# Patient Record
Sex: Female | Born: 1945 | Race: White | Hispanic: No | Marital: Married | Smoking: Never smoker
Health system: Southern US, Community
[De-identification: ages and names within clinical notes are randomized; demographics above are authoritative.]

## PROBLEM LIST (undated history)

## (undated) DIAGNOSIS — H269 Unspecified cataract: Secondary | ICD-10-CM

## (undated) DIAGNOSIS — T8859XA Other complications of anesthesia, initial encounter: Secondary | ICD-10-CM

## (undated) DIAGNOSIS — T4145XA Adverse effect of unspecified anesthetic, initial encounter: Secondary | ICD-10-CM

## (undated) DIAGNOSIS — N200 Calculus of kidney: Secondary | ICD-10-CM

## (undated) DIAGNOSIS — E079 Disorder of thyroid, unspecified: Secondary | ICD-10-CM

## (undated) DIAGNOSIS — D126 Benign neoplasm of colon, unspecified: Secondary | ICD-10-CM

## (undated) DIAGNOSIS — T753XXA Motion sickness, initial encounter: Secondary | ICD-10-CM

## (undated) DIAGNOSIS — R011 Cardiac murmur, unspecified: Secondary | ICD-10-CM

## (undated) DIAGNOSIS — I10 Essential (primary) hypertension: Secondary | ICD-10-CM

## (undated) DIAGNOSIS — R0902 Hypoxemia: Principal | ICD-10-CM

## (undated) DIAGNOSIS — I2510 Atherosclerotic heart disease of native coronary artery without angina pectoris: Secondary | ICD-10-CM

## (undated) DIAGNOSIS — N390 Urinary tract infection, site not specified: Secondary | ICD-10-CM

## (undated) DIAGNOSIS — K5900 Constipation, unspecified: Secondary | ICD-10-CM

## (undated) DIAGNOSIS — F329 Major depressive disorder, single episode, unspecified: Secondary | ICD-10-CM

## (undated) DIAGNOSIS — M549 Dorsalgia, unspecified: Secondary | ICD-10-CM

## (undated) DIAGNOSIS — K5792 Diverticulitis of intestine, part unspecified, without perforation or abscess without bleeding: Secondary | ICD-10-CM

## (undated) DIAGNOSIS — D72829 Elevated white blood cell count, unspecified: Secondary | ICD-10-CM

## (undated) DIAGNOSIS — E039 Hypothyroidism, unspecified: Secondary | ICD-10-CM

## (undated) DIAGNOSIS — E785 Hyperlipidemia, unspecified: Secondary | ICD-10-CM

## (undated) DIAGNOSIS — Z9981 Dependence on supplemental oxygen: Secondary | ICD-10-CM

## (undated) DIAGNOSIS — G4730 Sleep apnea, unspecified: Secondary | ICD-10-CM

## (undated) DIAGNOSIS — F32A Depression, unspecified: Secondary | ICD-10-CM

## (undated) DIAGNOSIS — M1990 Unspecified osteoarthritis, unspecified site: Secondary | ICD-10-CM

## (undated) DIAGNOSIS — L989 Disorder of the skin and subcutaneous tissue, unspecified: Secondary | ICD-10-CM

## (undated) DIAGNOSIS — B09 Unspecified viral infection characterized by skin and mucous membrane lesions: Principal | ICD-10-CM

## (undated) DIAGNOSIS — J40 Bronchitis, not specified as acute or chronic: Principal | ICD-10-CM

## (undated) DIAGNOSIS — R232 Flushing: Secondary | ICD-10-CM

## (undated) DIAGNOSIS — I313 Pericardial effusion (noninflammatory): Secondary | ICD-10-CM

## (undated) DIAGNOSIS — I219 Acute myocardial infarction, unspecified: Secondary | ICD-10-CM

## (undated) DIAGNOSIS — M797 Fibromyalgia: Secondary | ICD-10-CM

## (undated) DIAGNOSIS — K219 Gastro-esophageal reflux disease without esophagitis: Secondary | ICD-10-CM

## (undated) HISTORY — DX: Benign neoplasm of colon, unspecified: D12.6

## (undated) HISTORY — DX: Urinary tract infection, site not specified: N39.0

## (undated) HISTORY — DX: Disorder of thyroid, unspecified: E07.9

## (undated) HISTORY — DX: Constipation, unspecified: K59.00

## (undated) HISTORY — DX: Unspecified cataract: H26.9

## (undated) HISTORY — DX: Essential (primary) hypertension: I10

## (undated) HISTORY — DX: Pericardial effusion (noninflammatory): I31.3

## (undated) HISTORY — DX: Unspecified viral infection characterized by skin and mucous membrane lesions: B09

## (undated) HISTORY — DX: Elevated white blood cell count, unspecified: D72.829

## (undated) HISTORY — DX: Disorder of the skin and subcutaneous tissue, unspecified: L98.9

## (undated) HISTORY — DX: Depression, unspecified: F32.A

## (undated) HISTORY — DX: Hypoxemia: R09.02

## (undated) HISTORY — DX: Major depressive disorder, single episode, unspecified: F32.9

## (undated) HISTORY — DX: Hyperlipidemia, unspecified: E78.5

## (undated) HISTORY — DX: Calculus of kidney: N20.0

## (undated) HISTORY — DX: Flushing: R23.2

## (undated) HISTORY — DX: Bronchitis, not specified as acute or chronic: J40

## (undated) HISTORY — DX: Motion sickness, initial encounter: T75.3XXA

## (undated) HISTORY — DX: Diverticulitis of intestine, part unspecified, without perforation or abscess without bleeding: K57.92

## (undated) HISTORY — DX: Dorsalgia, unspecified: M54.9

## (undated) HISTORY — DX: Unspecified osteoarthritis, unspecified site: M19.90

---
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
## 1969-01-03 HISTORY — PX: CHOLECYSTECTOMY: SHX55

## 1975-05-06 HISTORY — PX: ABDOMINAL HYSTERECTOMY: SHX81

## 1976-05-05 HISTORY — PX: OTHER SURGICAL HISTORY: SHX169

## 1978-05-05 HISTORY — PX: ANGIOPLASTY: SHX39

## 1998-04-09 ENCOUNTER — Encounter: Payer: Self-pay | Admitting: Emergency Medicine

## 1998-04-09 ENCOUNTER — Inpatient Hospital Stay (HOSPITAL_COMMUNITY): Admission: EM | Admit: 1998-04-09 | Discharge: 1998-04-10 | Payer: Self-pay | Admitting: Emergency Medicine

## 1998-04-09 ENCOUNTER — Encounter: Payer: Self-pay | Admitting: Cardiovascular Disease

## 1998-12-04 ENCOUNTER — Other Ambulatory Visit: Admission: RE | Admit: 1998-12-04 | Discharge: 1998-12-04 | Payer: Self-pay | Admitting: Gynecology

## 2000-01-07 ENCOUNTER — Other Ambulatory Visit: Admission: RE | Admit: 2000-01-07 | Discharge: 2000-01-07 | Payer: Self-pay | Admitting: Gynecology

## 2000-03-05 ENCOUNTER — Encounter: Admission: RE | Admit: 2000-03-05 | Discharge: 2000-03-05 | Payer: Self-pay | Admitting: General Surgery

## 2000-03-05 ENCOUNTER — Encounter: Payer: Self-pay | Admitting: General Surgery

## 2000-11-30 ENCOUNTER — Ambulatory Visit (HOSPITAL_COMMUNITY): Admission: RE | Admit: 2000-11-30 | Discharge: 2000-11-30 | Payer: Self-pay | Admitting: Gastroenterology

## 2000-11-30 ENCOUNTER — Encounter (INDEPENDENT_AMBULATORY_CARE_PROVIDER_SITE_OTHER): Payer: Self-pay | Admitting: *Deleted

## 2000-12-10 ENCOUNTER — Encounter: Admission: RE | Admit: 2000-12-10 | Discharge: 2001-03-10 | Payer: Self-pay | Admitting: Family Medicine

## 2001-03-17 ENCOUNTER — Other Ambulatory Visit: Admission: RE | Admit: 2001-03-17 | Discharge: 2001-03-17 | Payer: Self-pay | Admitting: Gynecology

## 2001-04-26 ENCOUNTER — Encounter: Admission: RE | Admit: 2001-04-26 | Discharge: 2001-04-26 | Payer: Self-pay | Admitting: Gynecology

## 2001-04-26 ENCOUNTER — Encounter: Payer: Self-pay | Admitting: Gynecology

## 2001-07-22 ENCOUNTER — Encounter: Payer: Self-pay | Admitting: Family Medicine

## 2001-07-22 ENCOUNTER — Encounter: Admission: RE | Admit: 2001-07-22 | Discharge: 2001-07-22 | Payer: Self-pay | Admitting: Family Medicine

## 2002-05-02 ENCOUNTER — Encounter: Payer: Self-pay | Admitting: Gynecology

## 2002-05-02 ENCOUNTER — Encounter: Admission: RE | Admit: 2002-05-02 | Discharge: 2002-05-02 | Payer: Self-pay | Admitting: Gynecology

## 2002-05-02 ENCOUNTER — Other Ambulatory Visit: Admission: RE | Admit: 2002-05-02 | Discharge: 2002-05-02 | Payer: Self-pay | Admitting: Gynecology

## 2002-05-05 HISTORY — PX: BACK SURGERY: SHX140

## 2002-05-05 HISTORY — PX: OTHER SURGICAL HISTORY: SHX169

## 2002-08-01 ENCOUNTER — Encounter
Admission: RE | Admit: 2002-08-01 | Discharge: 2002-08-01 | Payer: Self-pay | Admitting: Physical Medicine and Rehabilitation

## 2002-08-01 ENCOUNTER — Encounter: Payer: Self-pay | Admitting: Physical Medicine and Rehabilitation

## 2002-10-13 ENCOUNTER — Inpatient Hospital Stay (HOSPITAL_COMMUNITY): Admission: RE | Admit: 2002-10-13 | Discharge: 2002-10-14 | Payer: Self-pay | Admitting: Specialist

## 2003-08-09 ENCOUNTER — Other Ambulatory Visit: Admission: RE | Admit: 2003-08-09 | Discharge: 2003-08-09 | Payer: Self-pay | Admitting: Gynecology

## 2003-08-09 ENCOUNTER — Encounter: Admission: RE | Admit: 2003-08-09 | Discharge: 2003-08-09 | Payer: Self-pay | Admitting: Gynecology

## 2003-12-20 ENCOUNTER — Observation Stay (HOSPITAL_COMMUNITY): Admission: RE | Admit: 2003-12-20 | Discharge: 2003-12-21 | Payer: Self-pay | Admitting: Specialist

## 2004-05-05 HISTORY — PX: ANGIOPLASTY: SHX39

## 2004-08-27 ENCOUNTER — Encounter: Admission: RE | Admit: 2004-08-27 | Discharge: 2004-08-27 | Payer: Self-pay | Admitting: Gynecology

## 2004-08-27 ENCOUNTER — Other Ambulatory Visit: Admission: RE | Admit: 2004-08-27 | Discharge: 2004-08-27 | Payer: Self-pay | Admitting: Gynecology

## 2004-10-15 ENCOUNTER — Encounter: Admission: RE | Admit: 2004-10-15 | Discharge: 2004-10-15 | Payer: Self-pay | Admitting: Specialist

## 2005-01-03 ENCOUNTER — Encounter: Admission: RE | Admit: 2005-01-03 | Discharge: 2005-01-03 | Payer: Self-pay | Admitting: Specialist

## 2005-01-08 ENCOUNTER — Encounter: Admission: RE | Admit: 2005-01-08 | Discharge: 2005-01-08 | Payer: Self-pay | Admitting: Specialist

## 2005-04-07 ENCOUNTER — Inpatient Hospital Stay (HOSPITAL_COMMUNITY): Admission: AD | Admit: 2005-04-07 | Discharge: 2005-04-11 | Payer: Self-pay | Admitting: Cardiovascular Disease

## 2005-04-24 ENCOUNTER — Inpatient Hospital Stay (HOSPITAL_COMMUNITY): Admission: EM | Admit: 2005-04-24 | Discharge: 2005-04-25 | Payer: Self-pay | Admitting: Emergency Medicine

## 2005-08-28 ENCOUNTER — Encounter: Admission: RE | Admit: 2005-08-28 | Discharge: 2005-08-28 | Payer: Self-pay | Admitting: Gynecology

## 2005-08-28 ENCOUNTER — Other Ambulatory Visit: Admission: RE | Admit: 2005-08-28 | Discharge: 2005-08-28 | Payer: Self-pay | Admitting: Gynecology

## 2006-05-05 HISTORY — PX: HIP SURGERY: SHX245

## 2006-06-01 DIAGNOSIS — G4730 Sleep apnea, unspecified: Secondary | ICD-10-CM

## 2006-06-01 HISTORY — DX: Sleep apnea, unspecified: G47.30

## 2006-10-12 ENCOUNTER — Other Ambulatory Visit: Admission: RE | Admit: 2006-10-12 | Discharge: 2006-10-12 | Payer: Self-pay | Admitting: Gynecology

## 2006-10-12 ENCOUNTER — Encounter: Admission: RE | Admit: 2006-10-12 | Discharge: 2006-10-12 | Payer: Self-pay | Admitting: Gynecology

## 2006-10-17 ENCOUNTER — Emergency Department (HOSPITAL_COMMUNITY): Admission: EM | Admit: 2006-10-17 | Discharge: 2006-10-17 | Payer: Self-pay | Admitting: Emergency Medicine

## 2007-04-23 ENCOUNTER — Observation Stay (HOSPITAL_COMMUNITY): Admission: EM | Admit: 2007-04-23 | Discharge: 2007-04-24 | Payer: Self-pay | Admitting: Emergency Medicine

## 2007-05-06 HISTORY — PX: ANGIOPLASTY: SHX39

## 2007-08-06 ENCOUNTER — Inpatient Hospital Stay (HOSPITAL_COMMUNITY): Admission: RE | Admit: 2007-08-06 | Discharge: 2007-08-07 | Payer: Self-pay | Admitting: Cardiovascular Disease

## 2007-08-19 ENCOUNTER — Encounter (HOSPITAL_COMMUNITY): Admission: RE | Admit: 2007-08-19 | Discharge: 2007-11-17 | Payer: Self-pay | Admitting: Cardiovascular Disease

## 2007-11-18 ENCOUNTER — Encounter (HOSPITAL_COMMUNITY): Admission: RE | Admit: 2007-11-18 | Discharge: 2008-01-27 | Payer: Self-pay | Admitting: Cardiovascular Disease

## 2008-01-20 ENCOUNTER — Encounter: Admission: RE | Admit: 2008-01-20 | Discharge: 2008-01-20 | Payer: Self-pay | Admitting: Gynecology

## 2008-01-20 ENCOUNTER — Other Ambulatory Visit: Admission: RE | Admit: 2008-01-20 | Discharge: 2008-01-20 | Payer: Self-pay | Admitting: Gynecology

## 2008-02-15 ENCOUNTER — Encounter: Admission: RE | Admit: 2008-02-15 | Discharge: 2008-02-15 | Payer: Self-pay | Admitting: Family Medicine

## 2008-05-05 HISTORY — PX: OTHER SURGICAL HISTORY: SHX169

## 2008-07-31 ENCOUNTER — Inpatient Hospital Stay (HOSPITAL_COMMUNITY): Admission: EM | Admit: 2008-07-31 | Discharge: 2008-08-02 | Payer: Self-pay | Admitting: Emergency Medicine

## 2008-08-01 ENCOUNTER — Encounter (INDEPENDENT_AMBULATORY_CARE_PROVIDER_SITE_OTHER): Payer: Self-pay | Admitting: Internal Medicine

## 2009-01-03 ENCOUNTER — Inpatient Hospital Stay (HOSPITAL_COMMUNITY): Admission: AD | Admit: 2009-01-03 | Discharge: 2009-01-04 | Payer: Self-pay | Admitting: Orthopedic Surgery

## 2009-05-05 LAB — HM COLONOSCOPY

## 2009-05-14 ENCOUNTER — Encounter: Admission: RE | Admit: 2009-05-14 | Discharge: 2009-05-14 | Payer: Self-pay | Admitting: Family Medicine

## 2009-05-22 ENCOUNTER — Encounter: Admission: RE | Admit: 2009-05-22 | Discharge: 2009-05-22 | Payer: Self-pay | Admitting: Family Medicine

## 2009-09-28 ENCOUNTER — Observation Stay (HOSPITAL_COMMUNITY): Admission: RE | Admit: 2009-09-28 | Discharge: 2009-09-29 | Payer: Self-pay | Admitting: Orthopedic Surgery

## 2010-05-25 ENCOUNTER — Other Ambulatory Visit: Payer: Self-pay | Admitting: Obstetrics and Gynecology

## 2010-05-25 DIAGNOSIS — Z1239 Encounter for other screening for malignant neoplasm of breast: Secondary | ICD-10-CM

## 2010-05-26 ENCOUNTER — Encounter: Payer: Self-pay | Admitting: Specialist

## 2010-05-26 ENCOUNTER — Encounter: Payer: Self-pay | Admitting: Family Medicine

## 2010-05-27 ENCOUNTER — Encounter: Payer: Self-pay | Admitting: Gynecology

## 2010-06-24 ENCOUNTER — Ambulatory Visit
Admission: RE | Admit: 2010-06-24 | Discharge: 2010-06-24 | Disposition: A | Discharge status: Home / Self Care | Payer: Medicare Other | Source: Ambulatory Visit | Attending: Obstetrics and Gynecology | Admitting: Obstetrics and Gynecology

## 2010-06-24 DIAGNOSIS — Z1239 Encounter for other screening for malignant neoplasm of breast: Secondary | ICD-10-CM

## 2010-07-08 ENCOUNTER — Encounter: Payer: Self-pay | Admitting: Family Medicine

## 2010-07-08 LAB — HM MAMMOGRAPHY

## 2010-07-16 ENCOUNTER — Encounter: Payer: Self-pay | Admitting: Family Medicine

## 2010-07-16 ENCOUNTER — Ambulatory Visit (INDEPENDENT_AMBULATORY_CARE_PROVIDER_SITE_OTHER): Payer: Medicare Other | Admitting: Family Medicine

## 2010-07-16 DIAGNOSIS — E119 Type 2 diabetes mellitus without complications: Secondary | ICD-10-CM | POA: Insufficient documentation

## 2010-07-16 DIAGNOSIS — I2510 Atherosclerotic heart disease of native coronary artery without angina pectoris: Secondary | ICD-10-CM

## 2010-07-16 DIAGNOSIS — E785 Hyperlipidemia, unspecified: Secondary | ICD-10-CM

## 2010-07-16 DIAGNOSIS — I10 Essential (primary) hypertension: Secondary | ICD-10-CM

## 2010-07-16 DIAGNOSIS — E039 Hypothyroidism, unspecified: Secondary | ICD-10-CM | POA: Insufficient documentation

## 2010-07-16 DIAGNOSIS — D649 Anemia, unspecified: Secondary | ICD-10-CM | POA: Insufficient documentation

## 2010-07-16 DIAGNOSIS — G4730 Sleep apnea, unspecified: Secondary | ICD-10-CM

## 2010-07-16 DIAGNOSIS — IMO0001 Reserved for inherently not codable concepts without codable children: Secondary | ICD-10-CM | POA: Insufficient documentation

## 2010-07-16 DIAGNOSIS — F341 Dysthymic disorder: Secondary | ICD-10-CM | POA: Insufficient documentation

## 2010-07-16 DIAGNOSIS — I059 Rheumatic mitral valve disease, unspecified: Secondary | ICD-10-CM | POA: Insufficient documentation

## 2010-07-16 DIAGNOSIS — K5730 Diverticulosis of large intestine without perforation or abscess without bleeding: Secondary | ICD-10-CM | POA: Insufficient documentation

## 2010-07-16 DIAGNOSIS — D126 Benign neoplasm of colon, unspecified: Secondary | ICD-10-CM | POA: Insufficient documentation

## 2010-07-16 DIAGNOSIS — G609 Hereditary and idiopathic neuropathy, unspecified: Secondary | ICD-10-CM | POA: Insufficient documentation

## 2010-07-16 DIAGNOSIS — Z9981 Dependence on supplemental oxygen: Secondary | ICD-10-CM | POA: Insufficient documentation

## 2010-07-17 ENCOUNTER — Encounter: Payer: Self-pay | Admitting: Family Medicine

## 2010-07-22 LAB — COMPREHENSIVE METABOLIC PANEL
ALT: 15 U/L (ref 0–35)
AST: 19 U/L (ref 0–37)
CO2: 32 mEq/L (ref 19–32)
Calcium: 9.5 mg/dL (ref 8.4–10.5)
Creatinine, Ser: 0.93 mg/dL (ref 0.4–1.2)
GFR calc Af Amer: >60 mL/min (ref >60)
GFR calc non Af Amer: >60 mL/min (ref >60)
Glucose, Bld: 141 mg/dL — ABNORMAL HIGH (ref 70–99)
Sodium: 141 mEq/L (ref 135–145)
Total Protein: 6.5 g/dL (ref 6.0–8.3)

## 2010-07-22 LAB — URINALYSIS, ROUTINE W REFLEX MICROSCOPIC
Bilirubin Urine: NEGATIVE
Hgb urine dipstick: NEGATIVE
Nitrite: NEGATIVE
Urobilinogen, UA: 1 mg/dL (ref 0.0–1.0)

## 2010-07-22 LAB — CBC
MCHC: 32.9 g/dL (ref 30.0–36.0)
MCV: 91.5 fL (ref 78.0–100.0)
RDW: 15.7 % — ABNORMAL HIGH (ref 11.5–15.5)

## 2010-07-22 LAB — GLUCOSE, CAPILLARY
Glucose-Capillary: 112 mg/dL — ABNORMAL HIGH (ref 70–99)
Glucose-Capillary: 164 mg/dL — ABNORMAL HIGH (ref 70–99)

## 2010-07-22 LAB — PROTIME-INR: Prothrombin Time: 12.6 seconds (ref 11.6–15.2)

## 2010-07-23 NOTE — Assessment & Plan Note (Signed)
Summary: New pt est care/dt   Vital Signs:  Patient profile:   65 year old female Menstrual status:  hysterectomy Height:      61 inches (154.94 cm) Weight:      247.25 pounds (112.39 kg) BMI:     46.89 O2 Sat:      90 % on Room air Temp:     98.1 degrees F (36.72 degrees C) oral Pulse rate:   77 / minute BP sitting:   116 / 72  (right arm) Cuff size:   large  Vitals Entered By: Josph Macho RMA (July 16, 2010 9:33 AM)  O2 Flow:  Room air CC: Establish new patient/ CF Is Patient Diabetic? Yes     Menstrual Status hysterectomy Last PAP Result historical   History of Present Illness:  patient is a 65 year old Caucasian female in today with her husband to establish medical care. she has a very complicated medical history this doing relatively well today. They have started her on multiple herbal medications are treating her peripheral neuropathy with gabapentin and she in her husband both agree that her chronic pain and debility are greatly improved. they agree with the initiation of herbal medicines her  mental clarity has improved and she is more active out in the world walking eating better and her mood is improved. so long history of shortness of breath for unclear reasons and actually history of hypoxia uses oxygen at night and only during the day when she is acutely short of breath. She sees Dr. Shon Baton still up for her OB/GYN care at their following some ovarian cysts she has an ultrasound scheduled for later this week. July history of coronary artery disease multiple angioplasties and stents currently follows with a cardiology Dr. Elyn Peers [facility] for. She sees Dr. Emily Filbert for her eye care.  she denies any recent illness, fevers, chills, chest pain, palpitations, GI or GU concerns appearing in any report  Preventive Screening-Counseling & Management  Alcohol-Tobacco     Smoking Status: never  Caffeine-Diet-Exercise     Does Patient Exercise: yes      Drug Use:  no.      Current Medications (verified): 1)  Vytorin 10-80 Mg Tabs (Ezetimibe-Simvastatin) .... Once Daily 2)  Plavix 75 Mg Tabs (Clopidogrel Bisulfate) .... Qd 3)  Amlodipine Besy-Benazepril Hcl 5-20 Mg Caps (Amlodipine Besy-Benazepril Hcl) .... Once Daily 4)  Metformin Hcl 1000 Mg Tabs (Metformin Hcl) .... Two Times A Day 5)  Aspirin 81 Mg Tbec (Aspirin) .... Once Daily 6)  Sucralfate 1 Gm Tabs (Sucralfate) .... When Needed 7)  Pantoprazole Sodium 40 Mg Tbec (Pantoprazole Sodium) .... Once Daily 8)  Celexa 40 Mg Tabs (Citalopram Hydrobromide) .... Once Daily 9)  Carvedilol 12.5 Mg Tabs (Carvedilol) .... Two Times A Day 10)  Levothroid 25 Mcg Tabs (Levothyroxine Sodium) .... Once Daily 11)  Vitamin D-3 1000 Mg .... Once Daily 12)  Gabapentin 300 Mg Caps (Gabapentin) .... 4 At Bedtime 13)  Cymbalta 60 Mg Cpep (Duloxetine Hcl) .... Two Times A Day 14)  Isosorbide Mononitrate Cr 60 Mg Xr24h-Tab (Isosorbide Mononitrate) .... Once Daily 15)  Citalopram Hydrobromide 40 Mg Tabs (Citalopram Hydrobromide) .... Once Daily 16)  Vitamin B-12 1000 Mcg Tabs (Cyanocobalamin) .... Once Daily 17)  Magnesium Oxide 400 Mg Tabs (Magnesium Oxide) .... Once Daily 18)  Gaba 500 Mg .... Two Times A Day 19)  Kava Kava 150 Mg .... Two Times A Day 20)  Brewers Yeast  Tabs (Brewers Yeast) .... Once Daily 21)  Magnesium 400 Mg .... Once Daily 22)  Vitamin D 2000 Unit Tabs (Cholecalciferol) .... Daily 23)  Probiotics .Marland KitchenMarland Kitchen. 1 Capsule 24)  Rhodeola Rosea 500 Mg .... Once Daily  Allergies (verified): No Known Drug Allergies  Past History:  Family History: Last updated: 08-14-2010 Father: deceased@63 , heart disease Mother: unknown history, abandoned familly Siblings:  Brother: deceased@58 , emphysema, smoker Brother: deceased@68 , emphysema, smoker, diabetes, heart disease Brother, twin, 60, thyroid cancer, CAD s/p MIs and 7 stents, DM, esophageal and tongue polyps, colonic polyps, back disease with bulging  discs Sister: 81, diabetes, HTN, colon cancer s/p colectomy, depression Sister: 1, diabetes, brain aneurysm s/p coiling, short term memory loss MGM: deceased of brain aneurysm at 84 MGF: unknown history PGM: deceased@80 , DM, HTN, colon cancer PGF: deceased at 26, run over by car Children: Son: 31, Crohn's Disease Daughter: 39, DM, HTN,Trichotrilomania, Depression, diverticulitis Son: 43, DM, anxious, smoker  Social History: Last updated: Aug 14, 2010 Retired Conservation officer, nature Married Never Smoked Drug use-no Regular exercise-yes, walking has begun Alcohol use-no Wears seat belt  No dietary restrictions, just diabetic precaustions  Risk Factors: Exercise: yes (08-14-10)  Risk Factors: Smoking Status: never (08-14-10)  Past medical history reviewed for relevance to current acute and chronic problems. Social history (including risk factors) reviewed for relevance to current acute and chronic problems.  Past Surgical History: Angioplasty 1980 Angioplasty 3 stents, 2006 Angioplasty 2 stents, 2009 Cholecystectomy Thyroidectomy for goiter, 1950 at age 19-6 yrs old Hysterectomy, partial, 1977 Diverticulitis, exploratory, 1978 right rotator cuff repair, 2004 left rotator cuff repair, 2004 Bone fusion left foot, 2010, 7 screws in foot, bunionectomy Hip surgery, large bone spur removed, right Back surgery, 2004, lumbar discectomy for bulging disc  Family History: Father: deceased@63 , heart disease Mother: unknown history, abandoned familly Siblings:  Brother: deceased@58 , emphysema, smoker Brother: deceased@68 , emphysema, smoker, diabetes, heart disease Brother, twin, 15, thyroid cancer, CAD s/p MIs and 7 stents, DM, esophageal and tongue polyps, colonic polyps, back disease with bulging discs Sister: 67, diabetes, HTN, colon cancer s/p colectomy, depression Sister: 72, diabetes, brain aneurysm s/p coiling, short term memory loss MGM: deceased of brain aneurysm at 50 MGF:  unknown history PGM: deceased@80 , DM, HTN, colon cancer PGF: deceased at 75, run over by car Children: Son: 64, Crohn's Disease Daughter: 40, DM, HTN,Trichotrilomania, Depression, diverticulitis Son: 41, DM, anxious, smoker  Social History: Reviewed history and no changes required. Retired Conservation officer, nature Married Never Smoked Drug use-no Regular exercise-yes, walking has begun Alcohol use-no Wears seat belt  No dietary restrictions, just diabetic precaustions Smoking Status:  never Drug Use:  no Does Patient Exercise:  yes  Review of Systems       The patient complains of depression.  The patient denies anorexia, fever, weight loss, weight gain, vision loss, decreased hearing, hoarseness, chest pain, syncope, dyspnea on exertion, peripheral edema, prolonged cough, headaches, hemoptysis, abdominal pain, melena, hematochezia, severe indigestion/heartburn, hematuria, incontinence, genital sores, muscle weakness, suspicious skin lesions, transient blindness, difficulty walking, unusual weight change, abnormal bleeding, and enlarged lymph nodes.    Physical Exam  General:  Well-developed,well-nourished,in no acute distress; alert,appropriate and cooperative throughout examination, obese Head:  Normocephalic and atraumatic without obvious abnormalities. No apparent alopecia or balding. Eyes:  No corneal or conjunctival inflammation noted. EOMI. Perrla. Funduscopic exam benign, without hemorrhages, exudates or papilledema. Vision grossly normal. Ears:  External ear exam shows no significant lesions or deformities.  Otoscopic examination reveals clear canals, tympanic membranes are intact bilaterally without bulging, retraction, inflammation or discharge. Hearing is grossly normal bilaterally.  Nose:  External nasal examination shows no deformity or inflammation. Nasal mucosa are pink and moist without lesions or exudates. Mouth:  Oral mucosa and oropharynx without lesions or exudates.  Teeth  in good repair. Neck:  No deformities, masses, or tenderness noted. Lungs:  Normal respiratory effort, chest expands symmetrically. Lungs are clear to auscultation, no crackles or wheezes. Heart:  Normal rate and regular rhythm. S1 and S2 normal without gallop, click, rub or other extra sounds.Grade  1 /6 systolic ejection murmur.   Abdomen:  Bowel sounds positive,abdomen soft and non-tender without masses, organomegaly or hernias noted. Msk:  No deformity or scoliosis noted of thoracic or lumbar spine.   Pulses:  R and L carotid,radial,femoral,dorsalis pedis and posterior tibial pulses are full and equal bilaterally Extremities:  No clubbing, cyanosis, edema, or deformity noted with normal full range of motion of all joints.   Neurologic:  No cranial nerve deficits noted. Station and gait are normal. Plantar reflexes are down-going bilaterally. DTRs are symmetrical throughout. Sensory, motor and coordinative functions appear intact. Skin:  Intact without suspicious lesions or rashes Cervical Nodes:  No lymphadenopathy noted Psych:  Cognition and judgment appear intact. Alert and cooperative with normal attention span and concentration. No apparent delusions, illusions, hallucinations   Impression & Recommendations:  Problem # 1:  ESSENTIAL HYPERTENSION, BENIGN (ICD-401.1)  Her updated medication list for this problem includes:    Amlodipine Besy-benazepril Hcl 5-20 Mg Caps (Amlodipine besy-benazepril hcl) ..... Once daily    Carvedilol 12.5 Mg Tabs (Carvedilol) .Marland Kitchen..Marland Kitchen Two times a day  Orders: T-Basic Metabolic Panel [id number]) T-Hepatic Function [phone]) Well controlled, no change in therapy  Problem # 2:  DM (ICD-250.00)  Her updated medication list for this problem includes:    Amlodipine Besy-benazepril Hcl 5-20 Mg Caps (Amlodipine besy-benazepril hcl) ..... Once daily    Metformin Hcl 1000 Mg Tabs (Metformin hcl) .Marland Kitchen..Marland Kitchen Two times a day    Aspirin 81 Mg Tbec (Aspirin) .....  Once daily  Orders: T- Hemoglobin A1C ([id number]) T-Urine Microalbumin w/creat. ratio [id number]) Monitor sugars, call if any concerns, avoid simple carbs  Problem # 3:  SLEEP APNEA (ICD-780.57) Use CPAP as instructed and will continue at bedtime O2 and O2 use during the day as needed  Problem # 4:  CAD (ICD-414.00)  Her updated medication list for this problem includes:    Plavix 75 Mg Tabs (Clopidogrel bisulfate) ..... Qd    Amlodipine Besy-benazepril Hcl 5-20 Mg Caps (Amlodipine besy-benazepril hcl) ..... Once daily    Aspirin 81 Mg Tbec (Aspirin) ..... Once daily    Carvedilol 12.5 Mg Tabs (Carvedilol) .Marland Kitchen..Marland Kitchen Two times a day     Isosorbide Mononitrate Cr 60 Mg Xr24h-tab (Isosorbide mononitrate) ..... Once daily Follows with Cardiology Dr Elyn Peers, will continue this care, is scheduled for repeat stress test in 7/12  Problem # 5:  ANEMIA (ICD-285.9)  Her updated medication list for this problem includes:    Vitamin B-12 1000 Mcg Tabs (Cyanocobalamin) ..... Once daily  Orders: T-CBC No Diff ([id number]) Maintain good intake of dark, leafy greens  Problem # 6:  UNSPECIFIED MYALGIA AND MYOSITIS (ICD-729.1)  Her updated medication list for this problem includes:    Aspirin 81 Mg Tbec (Aspirin) ..... Once daily Improved with increased, improved diet, weight loss and herbal supplements, patient will continue the same  Problem # 7:  UNSPECIFIED HYPOTHYROIDISM (ICD-244.9)  Her updated medication list for this problem includes:    Levothroid 25 Mcg Tabs (Levothyroxine sodium) ..... Once daily Patient  is on stable dose, check TSH  Complete Medication List: 1)  Vytorin 10-80 Mg Tabs (Ezetimibe-simvastatin) .... Once daily 2)  Plavix 75 Mg Tabs (Clopidogrel bisulfate) .... Qd 3)  Amlodipine Besy-benazepril Hcl 5-20 Mg Caps (Amlodipine besy-benazepril hcl) .... Once daily 4)  Metformin Hcl 1000 Mg Tabs (Metformin hcl) .... Two times a day 5)  Aspirin 81 Mg Tbec  (Aspirin) .... Once daily 6)  Sucralfate 1 Gm Tabs (Sucralfate) .... When needed 7)  Pantoprazole Sodium 40 Mg Tbec (Pantoprazole sodium) .... Once daily 8)  Celexa 40 Mg Tabs (Citalopram hydrobromide) .... Once daily 9)  Carvedilol 12.5 Mg Tabs (Carvedilol) .... Two times a day 10)  Levothroid 25 Mcg Tabs (Levothyroxine sodium) .... Once daily 11)  Vitamin D-3 1000 Mg  .... Once daily 12)  Gabapentin 300 Mg Caps (Gabapentin) .... 4 at bedtime 13)  Cymbalta 60 Mg Cpep (Duloxetine hcl) .... Two times a day 14)  Isosorbide Mononitrate Cr 60 Mg Xr24h-tab (Isosorbide mononitrate) .... Once daily 15)  Citalopram Hydrobromide 40 Mg Tabs (Citalopram hydrobromide) .... Once daily 16)  Vitamin B-12 1000 Mcg Tabs (Cyanocobalamin) .... Once daily 17)  Magnesium Oxide 400 Mg Tabs (Magnesium oxide) .... Once daily 18)  Gaba 500 Mg  .... Two times a day 19)  Kava Kava 150 Mg  .... Two times a day 20)  Brewers Yeast Tabs (Brewers yeast) .... Once daily 21)  Magnesium 400 Mg  .... Once daily 22)  Vitamin D 2000 Unit Tabs (Cholecalciferol) .... Daily 23)  Probiotics  .Marland Kitchen.. 1 capsule 24)  Rhodeola Rosea 500 Mg  .... Once daily  Other Orders: T-Lipid Profile [id number])  Patient Instructions: 1)  Please schedule a follow-up appointment in 3 months or sooner as needed 2)  BMP prior to visit, ICD-9: 401.1 3)  Hepatic Panel prior to visit ICD-9: 401.1 4)  Lipid panel prior to visit ICD-9 : 272.2 5)  TSH prior to visit ICD-9 : 401.1 6)  CBC w/ Diff prior to visit ICD-9 : 401.1 7)  HgBA1c prior to visit  ICD-9: 250.0 8)  Urine Microalbumin prior to visit ICD-9 : 250.0   Orders Added: 1)  T-Basic Metabolic Panel [[id number]] 2)  T-Hepatic Function [[id number]] 3)  T-Lipid Profile [[id number]] 4)  T-CBC No Diff [[id number]] 5)  T- Hemoglobin A1C [83036-23375] 6)  T-Urine Microalbumin w/creat. ratio [[id number]] 7)  New Patient Level IV [99204]    Preventive Care  Screening  Mammogram:    Date:  07/08/2010    Results:  historical   Pap Smear:    Date:  07/08/2010    Results:  historical   Last Flu Shot:    Date:  02/02/2010    Results:  historical   Colonoscopy:    Date:  05/05/2009    Results:  historical   Last Tetanus Booster:    Date:  05/06/2007    Results:  Historical

## 2010-08-09 LAB — COMPREHENSIVE METABOLIC PANEL
ALT: 15 U/L (ref 0–35)
AST: 22 U/L (ref 0–37)
Albumin: 3.1 g/dL — ABNORMAL LOW (ref 3.5–5.2)
Alkaline Phosphatase: 68 U/L (ref 39–117)
BUN: 9 mg/dL (ref 6–23)
Chloride: 105 mEq/L (ref 96–112)
GFR calc Af Amer: >60 mL/min (ref >60)
Potassium: 3.6 mEq/L (ref 3.5–5.1)
Total Bilirubin: 0.5 mg/dL (ref 0.3–1.2)

## 2010-08-09 LAB — CARDIAC PANEL(CRET KIN+CKTOT+MB+TROPI)
CK, MB: 2.5 ng/mL (ref 0.3–4.0)
Relative Index: 1.2 (ref 0.0–2.5)
Relative Index: 1.7 (ref 0.0–2.5)
Total CK: 202 U/L — ABNORMAL HIGH (ref 7–177)
Troponin I: 0.01 ng/mL (ref 0.00–0.06)
Troponin I: 0.02 ng/mL (ref 0.00–0.06)

## 2010-08-09 LAB — GLUCOSE, CAPILLARY
Glucose-Capillary: 145 mg/dL — ABNORMAL HIGH (ref 70–99)
Glucose-Capillary: 149 mg/dL — ABNORMAL HIGH (ref 70–99)

## 2010-08-09 LAB — CBC
HCT: 32.2 % — ABNORMAL LOW (ref 36.0–46.0)
Platelets: 179 10*3/uL (ref 150–400)
WBC: 9.6 10*3/uL (ref 4.0–10.5)

## 2010-08-12 ENCOUNTER — Other Ambulatory Visit: Payer: Self-pay | Admitting: Family Medicine

## 2010-08-13 NOTE — Telephone Encounter (Signed)
Pt has seen Dr. Abner Greenspan once.  She should have follow up in 3 months, no appt scheduled at this time.  Is it OK to refill this med?

## 2010-08-15 LAB — DIFFERENTIAL
Basophils Absolute: 0 10*3/uL (ref 0.0–0.1)
Basophils Relative: 0 % (ref 0–1)
Lymphocytes Relative: 4 % — ABNORMAL LOW (ref 12–46)
Monocytes Relative: 7 % (ref 3–12)
Neutro Abs: 9.4 10*3/uL — ABNORMAL HIGH (ref 1.7–7.7)
Neutrophils Relative %: 88 % — ABNORMAL HIGH (ref 43–77)

## 2010-08-15 LAB — GLUCOSE, CAPILLARY
Glucose-Capillary: 116 mg/dL — ABNORMAL HIGH (ref 70–99)
Glucose-Capillary: 122 mg/dL — ABNORMAL HIGH (ref 70–99)
Glucose-Capillary: 123 mg/dL — ABNORMAL HIGH (ref 70–99)
Glucose-Capillary: 124 mg/dL — ABNORMAL HIGH (ref 70–99)
Glucose-Capillary: 127 mg/dL — ABNORMAL HIGH (ref 70–99)
Glucose-Capillary: 127 mg/dL — ABNORMAL HIGH (ref 70–99)
Glucose-Capillary: 128 mg/dL — ABNORMAL HIGH (ref 70–99)

## 2010-08-15 LAB — URINALYSIS, ROUTINE W REFLEX MICROSCOPIC
Glucose, UA: NEGATIVE mg/dL
Hgb urine dipstick: NEGATIVE
Leukocytes, UA: NEGATIVE
Protein, ur: 30 mg/dL — AB
Specific Gravity, Urine: >1.046 — ABNORMAL HIGH (ref 1.005–1.030)
pH: 5.5 (ref 5.0–8.0)

## 2010-08-15 LAB — TROPONIN I: Troponin I: <0.01 ng/mL (ref 0.00–0.06)

## 2010-08-15 LAB — COMPREHENSIVE METABOLIC PANEL
AST: 123 U/L — ABNORMAL HIGH (ref 0–37)
Albumin: 3.1 g/dL — ABNORMAL LOW (ref 3.5–5.2)
Alkaline Phosphatase: 116 U/L (ref 39–117)
Alkaline Phosphatase: 194 U/L — ABNORMAL HIGH (ref 39–117)
BUN: 15 mg/dL (ref 6–23)
BUN: 20 mg/dL (ref 6–23)
BUN: 29 mg/dL — ABNORMAL HIGH (ref 6–23)
CO2: 28 mEq/L (ref 19–32)
Chloride: 104 mEq/L (ref 96–112)
Chloride: 106 mEq/L (ref 96–112)
Creatinine, Ser: 0.64 mg/dL (ref 0.4–1.2)
Creatinine, Ser: 0.74 mg/dL (ref 0.4–1.2)
Creatinine, Ser: 0.75 mg/dL (ref 0.4–1.2)
GFR calc Af Amer: >60 mL/min (ref >60)
GFR calc non Af Amer: >60 mL/min (ref >60)
Glucose, Bld: 140 mg/dL — ABNORMAL HIGH (ref 70–99)
Glucose, Bld: 141 mg/dL — ABNORMAL HIGH (ref 70–99)
Glucose, Bld: 153 mg/dL — ABNORMAL HIGH (ref 70–99)
Potassium: 3.7 mEq/L (ref 3.5–5.1)
Potassium: 3.8 mEq/L (ref 3.5–5.1)
Total Bilirubin: 0.4 mg/dL (ref 0.3–1.2)
Total Bilirubin: 1 mg/dL (ref 0.3–1.2)
Total Protein: 5.7 g/dL — ABNORMAL LOW (ref 6.0–8.3)
Total Protein: 6.1 g/dL (ref 6.0–8.3)

## 2010-08-15 LAB — LIPASE, BLOOD: Lipase: 19 U/L (ref 11–59)

## 2010-08-15 LAB — CBC
HCT: 38.8 % (ref 36.0–46.0)
HCT: 39.5 % (ref 36.0–46.0)
HCT: 43.5 % (ref 36.0–46.0)
Hemoglobin: 13.2 g/dL (ref 12.0–15.0)
Hemoglobin: 13.5 g/dL (ref 12.0–15.0)
MCHC: 34 g/dL (ref 30.0–36.0)
MCHC: 34.1 g/dL (ref 30.0–36.0)
MCV: 93.3 fL (ref 78.0–100.0)
MCV: 93.8 fL (ref 78.0–100.0)
MCV: 94 fL (ref 78.0–100.0)
Platelets: 155 10*3/uL (ref 150–400)
RBC: 4.14 MIL/uL (ref 3.87–5.11)
RBC: 4.63 MIL/uL (ref 3.87–5.11)
RDW: 14.4 % (ref 11.5–15.5)
RDW: 14.9 % (ref 11.5–15.5)
WBC: 10.7 10*3/uL — ABNORMAL HIGH (ref 4.0–10.5)
WBC: 9.2 10*3/uL (ref 4.0–10.5)

## 2010-08-15 LAB — URINE CULTURE

## 2010-08-15 LAB — CK TOTAL AND CKMB (NOT AT ARMC)
CK, MB: 2 ng/mL (ref 0.3–4.0)
Relative Index: INVALID (ref 0.0–2.5)
Total CK: 68 U/L (ref 7–177)

## 2010-08-15 LAB — URINE MICROSCOPIC-ADD ON

## 2010-08-15 LAB — CARDIAC PANEL(CRET KIN+CKTOT+MB+TROPI)
CK, MB: 1.8 ng/mL (ref 0.3–4.0)
CK, MB: 2 ng/mL (ref 0.3–4.0)
Relative Index: INVALID (ref 0.0–2.5)
Total CK: 71 U/L (ref 7–177)
Troponin I: <0.01 ng/mL (ref 0.00–0.06)

## 2010-08-15 LAB — POCT CARDIAC MARKERS
CKMB, poc: <1 ng/mL — ABNORMAL LOW (ref 1.0–8.0)
Myoglobin, poc: 80.5 ng/mL (ref 12–200)
Troponin i, poc: <0.05 ng/mL (ref 0.00–0.09)

## 2010-08-15 LAB — TSH: TSH: 2.141 u[IU]/mL (ref 0.350–4.500)

## 2010-08-15 LAB — LIPID PANEL
Cholesterol: 137 mg/dL (ref 0–200)
LDL Cholesterol: 77 mg/dL (ref 0–99)

## 2010-08-15 LAB — RAPID URINE DRUG SCREEN, HOSP PERFORMED
Barbiturates: NOT DETECTED
Benzodiazepines: NOT DETECTED
Cocaine: NOT DETECTED
Opiates: POSITIVE — AB

## 2010-08-15 LAB — PROTIME-INR: Prothrombin Time: 13.6 seconds (ref 11.6–15.2)

## 2010-08-22 ENCOUNTER — Telehealth: Payer: Self-pay

## 2010-08-22 NOTE — Telephone Encounter (Signed)
Pts spouse would like to know what patients liver test results were in March? I looked in EMR and don't see any results in the computer. I will try and locate them. [employer] is faxing paperwork and will be placed on MD's desk

## 2010-08-23 ENCOUNTER — Other Ambulatory Visit: Payer: Self-pay | Admitting: Family Medicine

## 2010-08-23 MED ORDER — EPINEPHRINE 0.3 MG/0.3ML IJ DEVI: 0.3000 mg | Freq: Once | INTRAMUSCULAR | Status: AC

## 2010-08-23 NOTE — Telephone Encounter (Signed)
Notify lfts normal in March. All labs good, hgba1c acceptable but minimize simple carbs, Total cholesterol good but TG up slightly, so again minimize simple carbs and start 2 fish oil caps if not already taking and a fiber supplement like Benefiber daily

## 2010-08-23 NOTE — Telephone Encounter (Signed)
OK to send in epipen to patient pharmacy to allergic reaction to insect bite. Give her an epipen pack disp one with 2 refill. Sig: use as directed for anaphylactic reaction, seek immediate medical care. Would have her take a Zyrtec 10mg  po daily and take some Benadryl liquid with her to take prn for any allergic reaction as well

## 2010-08-23 NOTE — Telephone Encounter (Signed)
Pts spouse informed and was retold the lab information. Pts RX sent into pharmacy

## 2010-08-23 NOTE — Telephone Encounter (Signed)
Notified pt of lab results.  She voices good understanding. Pt would like epi-pen called to pharm.  Pt and husband are going bear hunting and pt has an allergy to "no see ums".  Pt has had facial swelling and sore throat with prior bites.  Please advise.

## 2010-09-03 ENCOUNTER — Encounter: Payer: Self-pay | Admitting: Family Medicine

## 2010-09-04 ENCOUNTER — Encounter: Payer: Self-pay | Admitting: Family Medicine

## 2010-09-17 NOTE — Discharge Summary (Signed)
NAMECHRYL, HOLTEN            ACCOUNT NO.:  [id number]   MEDICAL RECORD NO.:  [id number]          PATIENT TYPE:  OIB   LOCATION:  6525                         FACILITY:  [facility]   PHYSICIAN:  Nicki Guadalajara, M.D.     DATE OF BIRTH:  06/24/1945   DATE OF ADMISSION:  08/06/2007  DATE OF DISCHARGE:                               DISCHARGE SUMMARY   HISTORY OF PRESENT ILLNESS:  Amber [name] is a 65 year old female patient  who came to the hospital for outpatient cardiac catheterization and  possible PCI.  She had noted some episodes of chest pressure.  She  underwent a nuclear perfusion scan on July 06, 2007.  It demonstrated  new ischemia in the mid anterior, anterior apical, and the apical  region.  Her EF was 71%.  Thus, she was brought to the hospital for  further evaluation.  She underwent cath by Dr. Nicki Guadalajara, and she was  noted to have a 70% proximal ostial LAD.  She had a 3.0 x 23 Cypher  stent placed.  She received Angiomax and additional Plavix.  Her  presumed discharge date will be on August 07, 2007, pending her labs.  If  they are within normal limits and she ambulates without any problems  with her groin, she will be able to be discharged home.   MEDICATIONS AT DISCHARGE:  1. Vytorin 10/80 everyday.  2. Toprol XL 100 mg a day.  3. Plavix 75 mg a day.  4. Protonix 40 mg a day.  5. Lotrel 5/20 every day.  6. Isosorbide mononitrate 60 mg a day.  7. Fortamet 1 g every day.  8. Aspirin 81 mg a day.  9. Sucralfate 1 g every p.m.  10.Lexapro 10 mg everyday.  11.Multivitamin every day.  12.Flax seeds 2 teaspoons everyday.  13.Black cohosh 2 everyday.  14.CPAP at bedtime.   She will follow up with Dr. Tresa Endo on August 18, 2007 at 12:15.  If she  has any problems with her groin, she knows she can give our office a  call.  She is not going home on nitroglycerin sublingual because she  does not tolerate sublingual nitroglycerin.   DISCHARGE DIAGNOSES:  1. Endocardial surface  area and abnormal Myoview with subsequent      cardiac catheterization showing progressive disease in her LAD with      subsequent stenting with a Cypher stent.  2. Previous coronary artery disease with remote intervention in 1980      in [place], November 2006.  She had progressive disease.  She      underwent stent intervention  to RCA, circumflex, and LAD.  She      then had a catheterization in 2007 in [place], which showed      patent stents in mid LAD, circumflex marginal, and RCA.  She did      have some 30% left main narrowing and 50% proximal LAD narrowing at      that time.  She had a 50% stenosis of the first diagonal branch and      70% stenosis in the small second  branch.  3. Normal ejection fraction.  4. Morbid obesity.  5. Obstructive sleep apnea.  6. Hypertension.  7. Hyperlipidemia.      Lezlie Octave, N.P.    ______________________________  Nicki Guadalajara, M.D.    BB/MEDQ  D:  08/06/2007  T:  08/07/2007  Job:  [id number]   cc:   Evelena Peat, M.D.

## 2010-09-17 NOTE — Consult Note (Signed)
NAMETREINA, ARSCOTT            ACCOUNT NO.:  [id number]   MEDICAL RECORD NO.:  [id number]          PATIENT TYPE:  INP   LOCATION:  3735                         FACILITY:  [facility]   PHYSICIAN:  Beckey Rutter, MD  DATE OF BIRTH:  [birth year]   DATE OF CONSULTATION:  DATE OF DISCHARGE:                                 CONSULTATION   CHIEF COMPLAINT:  Desaturation.   HISTORY OF PRESENT ILLNESS:  Amber [name] is a very pleasant 65 year old  Caucasian female sent from [facility] after foot  surgery.  The patient was admitted overnight for evaluation and for pain  control, but she was noticed to have desaturation below 90% on room air.  As documented, the O2 saturation dropped to 77, 78% on room air.  The  patient is known to have obstructive sleep apnea on CPAP.  The patient  used her machine at night, but she still has the desaturation as  discussed above.   The patient was noted to have weight gain recently more than 10 pounds,  although she continued to exercise to reduce weight.   She denied fever, cough, or headaches.  The patient had the foot surgery  as discussed above.  [facility] was called for assessment of the  desaturation.   Past medical history is significant for:  1. Coronary artery disease status post multiple stents.  Primary      cardiologist is Dr. Tresa Endo.  Last stent was in May 2009.  2. Obstructive sleep apnea.  3. Obesity.  4. Diabetes.  5. Depression.  6. Hyperlipidemia.  7. Peripheral neuropathy.   MEDICATIONS:  The patient was taking Toprol-XL, Plavix, Protonix,  Lotrel, vitamin D, darbepoetin, Fortamet, aspirin, sucralfate, Lexapro,  fish oil, metformin, multivitamins, Vytorin, Coreg, calcium  supplementation.   MEDICATION ALLERGIES:  Percocet.   Family history is significant for diabetes and heart disease.   SOCIAL HISTORY:  She denied smoking, tobacco abuse, or ethanol abuse.   REVIEW OF SYSTEMS:  A 14-point review of  systems is noncontributory,  otherwise, as per the HPI.   PHYSICAL EXAMINATION:  VITAL SIGNS:  Temperature 98.7, blood pressure is  137/76, pulse is 96, respiratory rate is 19, saturation is 97% on 4  liters.  HEENT:  Head, atraumatic normocephalic.  Eyes, PERRL.  Mouth, moist.  No  ulcer.  NECK:  Obese with carotid oropharynx.  Supple.  No JVD.  LUNGS:  Bilateral decreased air entry.  No adventitious sound.  CARDIAC:  The heart sound first and second was heard very distally.  ABDOMEN:  Obese and nontender.  Bowel sounds present.  EXTREMITIES:  No lower extremities edema.  The left foot is in bandage  status post surgery.  NEUROLOGICAL:  She is alert and oriented, somewhat sleepy.   LABORATORY TEST:  Her recent lab test is ordered, is still pending.   EKG:  EKG is still pending.   ASSESSMENT AND PLAN:  This is 65 years old with obstructive sleep apnea  desaturated after surgery.   PRESENT PROBLEMS:  1. Desaturation.  At this time, the patient seems to be stable, but  considering the fact that the patient had recent surgery and the      significant desaturations despite the CPAP, I would want to rule      out pulmonary embolism.  We will await for the renal function panel      and if her renal function is acceptable, we will continue the      investigation with CT angiogram.  At this time, the D-dimer is      invalid since the patient had recent surgery.  I will not order a D-      dimer.  2. I will rule the patient out for acute coronary syndrome.  So, I      will obtain a stat EKG and I will check her cardiac enzymes.  3. Diabetes.  If her renal function is stable, I will continue with      metformin.  I will add sliding scale and sensitive.  4. Undesirable weight gain, which was noticed recently which can      contribute to her obstructive sleep apnea and a similar picture of      desaturation since the patient had more than 10-pound unintentional      weight gain.  I  will obtain thyroid stimulating hormone and full      thyroid function.   Thank you very much for consultation.  [facility] will follow through  with you.      Beckey Rutter, MD  Electronically Signed     EME/MEDQ  D:  01/03/2009  T:  01/04/2009  Job:  [id number]

## 2010-09-17 NOTE — H&P (Signed)
Amber [name]            ACCOUNT NO.:  [id number]   MEDICAL RECORD NO.:  [id number]          PATIENT TYPE:  EMS   LOCATION:  MAJO                         FACILITY:  [facility]   PHYSICIAN:  Eduard Clos, MDDATE OF BIRTH:  June 03, 1945   DATE OF ADMISSION:  07/31/2008  DATE OF DISCHARGE:                              HISTORY & PHYSICAL   PRIMARY CARE PHYSICIAN:  Ace Gins, M.D.   CHIEF COMPLAINT:  Abdominal pain.   HISTORY OF PRESENT ILLNESS:  This is a 65 year old female with  noninsulin diabetes mellitus, type 2, hypertension, hyperlipidemia, CAD,  status post stenting, presently comes in with abdominal pain.  The pain  started out around the epigastric area from yesterday, is now diffuse,  associated with some nausea and vomiting.  She had thrown up around five  or six times from yesterday.  The pain is dull, aching, constant, and  mainly periumbilical area.  Denies any diarrhea, fever, or chills.  Denies any dysuria, discharges.  The pain sometimes radiates to her  chest when she tries to throw up, otherwise there is no chest pain.  Denies any shortness of breath, palpitations, dizziness, loss of  consciousness.  Denies any focal deficit, headache.  The patient had a  CAT scan of abdomen and pelvis.  It did not show any acute findings.  The patient is having persistent pain, has been admitted for further  observation and also to rule ACS.   PAST MEDICAL HISTORY:  1. Hypertension.  2. Diabetes mellitus, type 2.  3. CAD, status post stenting.  4. Hyperlipidemia.  5. OSA, uses CPAP at home.   PAST SURGICAL HISTORY:  1. Cholecystectomy.  2. Partial hysterectomy.  3. Low back surgery.  4. Rotator cuff surgery.   MEDICATIONS PRIOR TO ADMISSION:  1. Vytorin 10/80 mg p.o. daily.  2. Plavix 75 mg p.o. daily.  3. Amlodipine/benazepril 5/20 mg p.o. daily.  4. Imdur 30 mg p.o. daily.  5. Metformin 1000 mg p.o. daily.  6. Calcium 600 mg p.o. daily.  7. Gabapentin  300 mg p.o. daily.  8. Aspirin 81 mg p.o. daily.  9. Carafate 1 g p.o. daily ER.  10.Protonix 40 mg p.o. daily.  11.Lexapro 10 mg p.o. daily.  12.Coreg 12.5 mg p.o. b.i.d.   ALLERGIES:  No known drug allergies.   FAMILY HISTORY:  Noncontributory.   SOCIAL HISTORY:  The patient denies smoking cigarettes, drinking  alcohol, or using illegal drugs.   REVIEW OF SYSTEMS:  Per history of present illness, nothing else  significant.   PHYSICAL EXAMINATION:  GENERAL:  Exam at bed side, not in acute  distress.  VITAL SIGNS:  Blood pressure is 128/90, pulse 97.1, temperature 97.7,  respirations 18, saturating 93%.  HEENT:  Anicteric, no pallor.  LUNGS:  Bilateral air entry present.  No rhonchi, no crepitation.  HEART:  S1/S2 heard.  ABDOMEN:  Soft, nontender, bowel sounds heard.  No discoloration.  No  guarding and no rigidity.  CNS:  Alert, awake and oriented to time, place and person.  Moves upper  and lower extremities 5/5.  EXTREMITIES:  Peripheral pulses are felt.  No edema.   LABORATORY DATA:  Chemistry profiles are deferred.   CT of abdomen with contrast:  Small pericardial effusion.  No  disease,  mild chronic diverticular disease, no acute intra-abdominal process.  Umbilical hernia.  No bowel incarceration.  Sigmoid diverticulosis.  No  acute pelvic findings.  Acute abdominal series shows a few scattered  trauma in small bowel nonspecific.   CBC:  WBC is 10.7, hemoglobin 14.9, hematocrit 43.5, platelets 190,  neutrophils 88%.  Comprehensive metabolic panel; sodium 138, potassium  3.7, chloride 105, carbon dioxide 27, glucose 153, BUN 29, creatinine  0.7.  Total bilirubin 1.3, alkaline phosphatase 116, AST 240, ALT 127,  total protein 6.1, albumin 3.4, calcium 8.2, lipase 18.  CK-MB  less  than 1, troponin I less than 0.05.  UA shows protein 30, nitrites  negative, leukocytes negative, wbc's 0-2, bacteria few.   ASSESSMENT:  1. Abdominal pain, unclear etiology at this  time.  2. I would also like to rule out acute coronary syndrome in patient      with a history of coronary artery disease, status post stenting.  3. Hypertension.  4. Abnormal liver function tests.  This is probably medication-      related.  5. Diabetes mellitus, type 2.  6. Obesity.  7. Sleep apnea.   PLAN:  Admit patient to telemetry, will cycle cardiac markers.  Will  repeat labs in a.m. and LFTs if any worsening, and we may have to get an  MRCP.  Will presently hold all the Vytorin and metformin and place  patient on clear liquid diet.  Resume all other medications and further  recommendations as conditions evolves.  If abdominal pain persists we  may have to get a GI consult.      Eduard Clos, MD  Electronically Signed     ANK/MEDQ  D:  07/31/2008  T:  07/31/2008  Job:  [id number]

## 2010-09-17 NOTE — H&P (Signed)
NAMEDIMITRI, DSOUZA            ACCOUNT NO.:  [id number]   MEDICAL RECORD NO.:  [id number]          PATIENT TYPE:  INP   LOCATION:  3735                         FACILITY:  [facility]   PHYSICIAN:  Leonides Grills, M.D.     DATE OF BIRTH:  08-19-1945   DATE OF ADMISSION:  01/03/2009  DATE OF DISCHARGE:                              HISTORY & PHYSICAL   PRIMARY CARE PHYSICIAN:  Ace Gins, MD   CHIEF COMPLAINT:  Status post left foot surgery.   HISTORY OF PRESENT ILLNESS:  Ms. Mclaurin is a pleasant female who  underwent left foot surgery yesterday at the [facility].  The patient was admitted overnight for evaluation of pain  control.  Unfortunately, the patient has been unable to maintain her  oxygen saturations greater than 90% on room air.  In fact, when she  transfers to bedside commode, her O2 saturation drops to 77-78% on room  air.  Norco was given last at 4:00 a.m. and Robaxin at 6:50 a.m.  Otherwise, the patient has no pain medication.   ALLERGIES:  PERCOCET.   MEDICATIONS:  1. Toprol.  2. Plavix.  3. Protonix.  4. Lotrel.  5. Vitamin D.  6. Gabapentin.  7. Fortamet.  8. Aspirin.  9. Sucralfate.  10.Lexapro.  11.Fish oil.  12.Metformin.  13.Multivitamin.  14.Vytorin.  15.Carvedilol.  16.Calcium.   PAST MEDICAL HISTORY:  1. Hyperlipidemia.  2. Diabetes mellitus.  3. Hypertension.  4. Coronary artery disease and stent placement.  5. Angioplasty.  6. History of MI.  7. Sleep apnea.  8. Metabolic syndrome.  9. Obesity.   PAST SURGICAL HISTORY:  1. Left first, second, and third TMT joint fusion.  Left navicular      cuneiform fusion, gastroc slide, modified McBride bunionectomy      January 02, 2009.  2. Cholecystectomy.  3. Back surgery.  4. Bilateral rotator cuff repairs.  5. Partial hysterectomy.   SOCIAL HISTORY:  The patient is married.  He denies any tobacco or  alcohol use.   REVIEW OF SYSTEMS:  See above medical history.   Denies any chest pain,  shortness of breath, on O2.  Pain is under fair control.   PHYSICAL EXAMINATION:  GENERAL:  The patient is awake, alert, appears  very tired.  CHEST:  Lungs are clear to auscultation.  Decreased breath sounds in  lower lung fields.  CARDIAC:  Regular rate and rhythm.  No murmurs, rubs, or gallops noted.  EXTREMITIES:  Left foot dressing clean, dry, and intact.  NEUROLOGIC:  Positive EHL and FHL.  Positive sensation to light touch,  toes.   ASSESSMENT AND PLAN:  The patient is a 65 year old female, postop day #1  status post left foot surgery with postop O2 desaturation, history of  diabetes mellitus, hypertension, coronary artery disease with stent  placement, angioplasty, history of myocardial infarction, sleep apnea,  metabolic syndrome, and obesity.   PLAN:  1. Admit the patient to [facility] telemetry bed.  2. Portable chest x-ray due to decreased O2 saturation.  3. Check CBC and BMET.  4. Four liters O2 via  nasal cannula and keep O2 saturation greater      than 90%.  Consult [facility] for management of      medical issues and to manage the patient while she is hospitalized.      [facility] was called and Dr. Ardyth Harps at [facility]      stated she would admit the patient.      Richardean Canal, P.A.      Leonides Grills, M.D.  Electronically Signed    GC/MEDQ  D:  01/03/2009  T:  01/04/2009  Job:  [id number]

## 2010-09-17 NOTE — Discharge Summary (Signed)
Amber [name], Amber [name]            ACCOUNT NO.:  [id number]   MEDICAL RECORD NO.:  [id number]          PATIENT TYPE:  INP   LOCATION:  2025                         FACILITY:  [facility]   PHYSICIAN:  Dani Gobble, MD       DATE OF BIRTH:  June 29, 1945   DATE OF ADMISSION:  04/23/2007  DATE OF DISCHARGE:  04/24/2007                               DISCHARGE SUMMARY   Ms. Federici is a 65 year old married female who came to the emergency  room after being seen by her primary MD because of multiple episodes of  diaphoresis.  They occur at any time.  It starts from her neck down, and  she has a severe episode of sweating.  It can last for 45 minutes.  She  gets nauseated.  She has no chest pain associated with it.  She has  checked her blood sugars during these episodes, and they were not  abnormal, and her blood pressure was also 152/97.  They started about 1  month ago, and she is also feeling very fatigued.  She was seen by Dr.  Kem Boroughs in the emergency room.  It was decided to admitted her to  rule her out for any cardiac issues.  Her BNP was less than 30.  Her  labs were all negative.  Her CK-MBs were negative.  Thus, on April 24, 2007, she was considered stable for discharge home.  Her telemetry  showed sinus rhythm, rate of 74.  Her blood pressure was 138/82.  She  was afebrile.  Her orthostatic blood pressures were also negative.  She  was sent home on her usual medications which are:  1. Vytorin 10/80 once a day.  2. Toprol 50 mg 1-1/2 every day.  3. Plavix 75 mg every day.  4. Lotrel 5/20 once a day.  5. Isosorbide mononitrate 30 mg once a day.  6. Fortamet 1000 mg once a day.  7. Aspirin once a day.  8. Sucralfate 1 g as needed p.r.n.  9. Protonix  40 mg once a day.  10.Lexapro 10 mg once a day.   We found out that she was not wearing her CPAP.  She has obstructive  sleep apnea and, thus, a causative factor may be not wearing her CPAP.   LABORATORY DATA:  CA-125 was 8.2,  hemoglobin A1c was 6.6.  Troponins  were negative. CK-MBs were negative.  BMET:  sodium 141, potassium 3.8,  chloride 104, CO2 32, glucose was 168, BUN 19, creatinine 0.89.  Total  cholesterol was 136, triglycerides were 108, HDL was 39 and LDL was 75.  TSH was 2.447.  Her hemoglobin was 13.4, hematocrit was 39.3, platelets  were 253.  Her WBCs were elevated at 13.9.  Urinalysis was negative.  D-  dimer was less than 0.22.  ESR was 4.  Magnesium was 2.   DISCHARGE DIAGNOSIS:  1. Presyncope with some extreme diaphoresis spells.  No causative      factor found except for possible not wearing her continuous      positive airway pressure.  2. Obstructive sleep apnea; has not been wearing  her continuous      positive airway pressure for over a month.  3. Coronary artery disease, stable.  4. Hypertension, stable.  5. Hyperlipidemia.  6. Normal left ventricular function.      Lezlie Octave, N.P.    ______________________________  Dani Gobble, MD    BB/MEDQ  D:  04/24/2007  T:  04/26/2007  Job:  [id number]   cc:   Teena Irani. Arlyce Dice, M.D.

## 2010-09-17 NOTE — Cardiovascular Report (Signed)
NAMEMONETTA, LICK            ACCOUNT NO.:  [id number]   MEDICAL RECORD NO.:  [id number]          PATIENT TYPE:  OIB   LOCATION:  2807                         FACILITY:  [facility]   PHYSICIAN:  Nicki Guadalajara, M.D.     DATE OF BIRTH:  1945/05/13   DATE OF PROCEDURE:  08/06/2007  DATE OF DISCHARGE:                            CARDIAC CATHETERIZATION   INDICATIONS:  Amber [name] is a very pleasant 65 year old female  who is status post remote intervention in 49 [place].  In November 2006 she underwent staged intervention to her  RCA circumflex and LAD by me.  At last catheterization in [place], [place], in 2007 she had 30% left main narrowing, 50% ostial proximal LAD  prior to the LAD stent with patent stent.  She has recently developed  some vague episodes of chest pressure and also has noted some mild  shortness of breath.  She recently underwent a nuclear perfusion study  on July 06, 2007, and this now demonstrated new ischemia in the LAD  territory involving the mid anterior, anteroapical and apical region.  She does have obstructive sleep apnea on CPAP with improved sleep.  In  light of new LAD ischemia, repeat catheterization with possible  intervention was recommended.   PROCEDURE:  After premedication with Valium 5 mg intravenously, the  patient was prepped and draped in the usual fashion.  Her right femoral  artery was punctured anteriorly and a 5-French sheath was inserted.  Diagnostic catheterization was done utilizing 5-French Judkins 4 left  and right coronary catheters.  The right catheter was also used for  selective angiography in the left subclavian internal mammary artery  system.  The pigtail catheter was used for biplane cine left  ventriculography.  Distal aortography was not done since this had been  done at the patient's last catheterization.   With the demonstration of progressive disease in the ostium proximal LAD  prior to  the stented segment and with concordant data suggesting  ischemia in the LAD territory, the decision was made to attempt  intervention to this segment.  The system was upgraded to a 6-French  sheath.  The patient was given Angiomax for anticoagulation.  She  already was on chronic Plavix and received an additional 150 mg Plavix  in the laboratory.  ACT was documented to be therapeutic.  She received  additional 3 mg of Valium as well as 25 mg of fentanyl.  A 3.5 XP LAD  guide was used and a Forensic psychologist wire was advanced down the LAD system.  Due to the somewhat eccentricity to the lesion, cutting balloon  arthrotomy was done for predilatation utilizing a 2.5 x 10-mm cutting  balloon.  It was felt that since there was disease extending from the  ostium being most significant at 70-75% but then with narrowing of 40%  beyond this, it was felt that the entire are should be stented with  overlap to the previously placed stent.  Consequently, a 3.0 x 23-mm  drug-eluting Cypher stent was used with careful attention to place this  at the ostium of  the LAD and also to overlap the very proximal portion  of the previously placed stent beyond the diagonal vessel.  Dilatations  were made x2 up to 15 atmospheres.  A 3.25 x 20-mm Quantum balloon was  used for post-stent dilatation up to 3.25 mm.  Scout angiography  confirmed an excellent angiographic result.  There is brisk TIMI 3 flow.  There was no evidence for dissection.  The patient tolerated the  procedure well.   Central aortic pressure was 155/79.  Left ventricular pressure was  155/15.   During the procedure the patient did receive several doses of  intracoronary nitroglycerin.   ANGIOGRAPHIC DATA:  The left main coronary artery had an upward takeoff  and then 30% ostial taper.  The left main was moderate-size vessel that  bifurcated into an LAD and left circumflex system.   The LAD had evidence for diffuse eccentric narrowing of 70-75% in  the  very proximal near ostial segment and then had narrowing of 30-40%  extending to just proximal to the diagonal vessel.  The previously-  placed stent was placed just after the diagonal and septal perforating  artery and was widely patent.  The remainder of the LAD was free of  significant disease and extended to the apex.   The circumflex vessel gave rise to a very large first marginal branch  which had a widely patent previously placed stent without evidence for  in-stent restenosis.   The right coronary artery had widely patent previously placed stent in  the proximal to mid segment with minimal luminal narrowing of 10-20% in  the distal third portion of the stent not felt to be significant.   The left subclavian and internal mammary artery were normal in the event  the patient required CBG revascularization surgery.   Biplane cine left ventriculography revealed normal contractility without  focal segmental wall motion abnormalities.   Following successful percutaneous coronary intervention to the left  anterior descending artery utilizing cutting balloon arthrotomy as well  as stenting with ultimate insertion of a 3.0 x 23-mm drug-eluting Cypher  stent extending from the LAD ostium being placed with mild overlap to  the previously placed 3.0 x 13-mm stent at the diagonal vessel, and with  post dilatation up to 3.25 mm, the diffuse proximal LAD stenoses were  reduced from 70-75% to 0%.   IMPRESSION:  1. Normal left ventricular function.  2. A 30% ostial narrowing in the left main.  3. A 70-75% near ostial proximal narrowing of the left anterior      descending artery prior to the previously placed left anterior      descending artery stent with widely patent previously placed 3.0 x      13-mm Cypher stent in the left anterior descending artery beyond      the diagonals and septal perforating artery.  4. Widely patent stent in the circumflex coronary artery large       marginal branch.  5. Widely patent stent in the right coronary artery with minimal 10-      20% luminal narrowing in the stented segment.  6. Successful percutaneous coronary intervention with cutting balloon      arthrotomy/stenting with a 3.0 x 23-mm Cypher stent and post      dilatation up to 3.25 mm with the residual narrowing being reduced      to 0% done with Angiomax and Plavix in addition to intracoronary      nitroglycerin.  ______________________________  Nicki Guadalajara, M.D.     TK/MEDQ  D:  08/06/2007  T:  08/06/2007  Job:  [id number]   cc:   Evelena Peat, M.D.

## 2010-09-17 NOTE — Discharge Summary (Signed)
Amber [name], Amber [name]            ACCOUNT NO.:  [id number]   MEDICAL RECORD NO.:  [id number]          PATIENT TYPE:  INP   LOCATION:  5029                         FACILITY:  [facility]   PHYSICIAN:  Beckey Rutter, MD  DATE OF BIRTH:  [birth year]   DATE OF ADMISSION:  07/31/2008  DATE OF DISCHARGE:  08/02/2008                               DISCHARGE SUMMARY   PRIMARY CARE PHYSICIAN:  Ace Gins, MD.   CHIEF COMPLAINT AND HISTORY OF PRESENT ILLNESS:  A 65 year old very  pleasant Caucasian female with past medical history significant for  hypertension, hyperlipidemia, coronary artery disease, family history of  colon cancer, presented with abdominal/epigastric pain.   HOSPITAL PROCEDURES:  1. KUB on July 31, 2008, the impression is few scattered loops of      minimally prominent small bowel are nonspecific.  2. July 31, 2008, the patient had CT abdomen and pelvis.  The CT      abdomen showing impression of small pericardial effusion.  Old      granulomatous disease.  Mild colonic diverticular disease.  No      acute intra-abdominal process.  Umbilical hernia.  No bowel      incarceration.  The CT pelvis was showing impression of sigmoid      diverticulosis.  No acute pelvic finding.  3. August 02, 2008, today, the patient had abdominal ultrasound, the      impression is no acute finding.  Postoperative change of      cholecystectomy noted.  4. August 01, 2008, the patient had 2-D echo, the impression is normal      LV size with normal systolic function.  Estimated ejection fraction      of greater than 55%.  Normal RV size and systolic function.  Normal      LA and RA size.  Trace TR and PR.  RVSP within normal limits.      Small pericardial effusion with no hemodynamic compromise.  No      prior study available for comparison.  The pericardium was normal      in appearance.   Sodium as of today is 140, potassium 43.9, chloride is 106, bicarb 28,  glucose 141, BUN is 15,  creatinine 0.64.  White blood count is 9.2,  hemoglobin is 13.2, hematocrit is 38.8, platelet count is not done  secondary to clumps.  Total cholesterol is 137, Tg is 103, HDL is 37,  LDL is 77, lipase is 19.  Troponin more than 3 times is less than 0.01.   DISCHARGE DIAGNOSES:  1. Abdominal pain with elevated transaminases, etiology is unclear.  2. Diabetes type 2.  3. Hypertension.  4. Coronary artery disease status post stenting on Plavix.  5. Hyperlipidemia.  6. Obstructive sleep apnea, continuous positive airway pressure at      home.   DISCHARGE MEDICATIONS:  1. Plavix 75 mg p.o. daily.  2. Amlodipine/benazepril 5/20 mg p.o. daily.  3. Imdur 30 mg p.o. daily.  4. Metformin 1000 mg p.o. daily.  5. Calcium 600 mg p.o. daily.  6. Neurontin 300 mg p.o. daily.  7. Aspirin 81  mg daily.  8. Carafate 1 g p.o. daily.  9. Protonix 10 mg p.o. daily.  10.Coreg 12.5 mg p.o. b.i.d.   The patient was admitted with abdominal pain and ruled out for acute  coronary syndrome.  The patient's etiology for abdominal pain is not  clear, although the patient has elevated transaminases.  Her hepatitis  panel is negative for hepatitis B surface antigen and hepatitis C  antibodies.  Hepatitis A is not done, but the patient does not have  elevated bilirubin.  Her total bilirubin today is 0.4, alkaline  phosphatase 156, AST is 123, ALT 310.  At this time, I discontinued the  Vytorin and the patient should followup with her primary, Dr. Larina Bras, and  her cardiologist to reevaluate the liver function test and reinitiate  her is status as necessary.  I discussed the plan with the patient and  her husband and they preferred to be discharged today without waiting  for gastroenterology consultation.  At this time, the patient is  tolerating p.o. food and she has stable vitals.  I will discharge the  patient to home.  She is stable for discharge.  The patient and her  husband are aware and agreeable to  discharge plan.      Beckey Rutter, MD  Electronically Signed     EME/MEDQ  D:  08/02/2008  T:  08/03/2008  Job:  [id number]   cc:   Ace Gins, MD

## 2010-09-20 NOTE — Op Note (Signed)
NAMESHAMICA, Amber [name]                      ACCOUNT NO.:  [id number]   MEDICAL RECORD NO.:  [id number]                   PATIENT TYPE:  AMB   LOCATION:  DAY                                  FACILITY:  [facility]   PHYSICIAN:  Jene Every, M.D.                 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  Rotator cuff tear.   PROCEDURE:  Open distal clavicle resection, acromioplasty, and rotator cuff  repair.   ANESTHESIA:  General.   SURGEON:  Jene Every, M.D.   ASSISTANT:  Roma Schanz, P.[name].   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 MRI indicating no tear, but significant  osteoarthrosis.  Operative intervention is indicated for distal clavicle  resection, acromioplasty, and possible rotator cuff repair.  The risks and  benefits discussed, including bleeding, infection, abnormal range of motion,  retear, nonhealing, etc.   DESCRIPTION OF PROCEDURE:  With the patient in the supine beach chair  position after adequate general endotracheal with 1 gram of Kefzol in the  right shoulder.  The right upper extremity was prepped and draped in the  usual sterile fashion.  1 gram of Kefzol IV was given.  Surgical marker was  utilized to delineate the acromion and the [facility] joint.  The patient had  significant subcutaneous adipose tissue.  Incision was made in the anterior  aspect of the acromion and along this line, subcutaneous tissue was  dissected and electrocautery used to achieve hemostasis.  Raphe between the  anterolateral heads of the deltoid was identified, divided, carried up over  the [facility] joint through the capsule.  Distal clavicle skeletonized.   Homan's  were placed on the anterior and posterior aspect of the clavicle and baby  Bennett between the clavicle.  Oscillating saw utilized to remove 1 cm of  the distal clavicle.  This was palpated and there was [name] small residual  osteophyte and was undercut with 3 mm Kerrison decompressing this region.  There was [name] large anterior spur off the acromion as well and oscillating saw  was utilized to remove this. Further contour with the Bear rongeur.  This  was after the CA ligament was detached.   Next, [name] full bursectomy and there was [name] full-thickness tear in the  supraspinatus tendon at its insertion. The ends were debrided. The bone was  rongeured underneath that and encouraged with good bleeding tissue.  The  rotator cuff tear was 1 cm in length and repaired with #1 Vicryl in [name] figure-  of-eight suture side-to-side and then the tendon was advanced into the  trough with [name] parachute suture anchor with excellent purchase.  Further inspection of the rotator cuff revealed no evidence of residual  tear. The wound was copiously irrigated and there was no active bleeding.  Bone wax was utilized.   Repaired the raphe with #1 Vicryl in figure-of-eight sutures as well as the  capsule over the [facility] joint, as well as the deltotrapezial fascia.  Also  repaired the subcutaneous tissue with 2-0 Vicryl simple suture and the skin  was  reapproximated with 4-0 subcuticular.  The wounds were reinforced with Steri-  Strips, [name] sterile dressing was applied, placed in abduction pillow,  extubated without difficulty, and transferred to the recovery room in  satisfactory condition.                                                Jene Every, M.D.    Cordelia Pen  D:  10/12/2002  T:  10/12/2002  Job:  [id number]

## 2010-09-20 NOTE — H&P (Signed)
NAMESALLI, BODIN            ACCOUNT NO.:  [id number]   MEDICAL RECORD NO.:  [id number]          PATIENT TYPE:  INP   LOCATION:  4707                         FACILITY:  [facility]   PHYSICIAN:  Cristy Hilts. Jacinto Halim, MD       DATE OF BIRTH:  1946-02-29   DATE OF ADMISSION:  04/24/2005  DATE OF DISCHARGE:  04/25/2005                                HISTORY & PHYSICAL   [name] is a 65 year old patient of Dr. Landry Dyke with a history of  coronary disease.  Initially, she had an intervention in [place],  [place], in the 1980s, after which she was able to do things she had not  been doing in a while.  Then she had a follow-up cath in [place] that was  reportedly normal.  Cardiolite in May 2005, was negative for ischemia.  She  was seen in the office in November with chest pain consistent with angina.  Symptoms were exertional and associated with diaphoresis.  She had been  having them for two months prior to the office visit.  Her symptoms were  relieved with rest and she was set up for outpatient cath at [facility] which was done April 02, 2005, showing 90% RCA, 90% OM1, 80% LAD,  normal LV function, normal renals, normal iliacs and patent LIMA.  It was  felt to proceed with a stage intervention was best treatment choice.  She  therefore was admitted to [facility] and underwent PCI  with CYPHER stenting on April 07, 2005, to the RCA reducing 80, 95 and  70% stenosis to 0. She had a very proximal 20% stenosis of the RCA as well  that did not need intervention.   Then on April 10, 2005, she was taken back to the cath lab and underwent  PTCA and CYPHER stenting to the LAD reducing 80% to 0 and on the circumflex  50 to 90% stenosis to 0.  She had residual 30 to 40% LAD stenosis.   Patient was discharged in good condition though she is still continuing with  the chest pressure her anginal symptoms. Instead of being a 4 or 5, it was a  1 to 1.5 in  discomfort.  Usually only occurred with exertion.  Initially,  she was a little nauseated when she went home but now no nausea.  Some mild  shortness of breath when the pressure comes on.  It had only been with  exertion, but now is also at rest.  Whenever she was in the lounge chair,  she had an episode.  She is having this on a daily basis and is concerned  because previously with PCIs, the intervention solved the problem.  She is  on isosorbide mononitrate.   She presents to the ER today with a chest pressure that comes and goes and  for further evaluation.   ALLERGIES:  NO KNOWN DRUG ALLERGIES.   OUTPATIENT MEDICATIONS:  1.  Imdur 30 mg daily.  2.  Toprol XL 75 daily.  3.  Lotrel 5/20 daily.  4.  Vytorin 10/40 daily.  5.  Aspirin daily.  6.  Plavix 75 daily.  7.  Nitroglycerin sublingual p.r.n.  8.  Nexium 40 daily.   PAST MEDICAL HISTORY:  1.  Coronary disease as described.  2.  Hyperlipidemia.  3.  Hypertension.  4.  Morbid obesity.  5.  Degenerative joint disease.   FAMILY HISTORY:  No coronary disease.   SOCIAL HISTORY:  She is married.  Her husband is very supportive and here  with her.  Three children and two grandchildren.  Does not use tobacco.   FAMILY HISTORY:  Not remarkable for coronary disease.   REVIEW OF SYSTEMS:  GI:  No indigestion, diarrhea, constipation, or melena.  GU:  No hematuria or dysuria.  NEUROLOGIC:  No syncope, no dizziness, no  lightheadedness.  CARDIOVASCULAR:  As stated.  PULMONARY:  Mild shortness of  breath with chest pressure at times.  EXTREMITIES:  Without edema.  SKIN:  Without rashes.   PHYSICAL EXAMINATION:  GENERAL APPEARANCE:  Alert and oriented white female  in no acute distress.  VITAL SIGNS:  Blood pressure initially was 83/23, now 136/82, temperature  98, pulse 90, respirations 16.  SKIN:  Warm and dry.  Brisk capillary refill.  HEENT:  Sclerae are clear.  NECK:  Supple.  No JVD, no bruits, no thyromegaly.  LUNGS:   Clear without wheezing, rhonchi or rales.  CARDIOVASCULAR:  S1 and S2, regular rate and rhythm, no obvious murmur.  ABDOMEN:  Soft and nontender with positive bowel sounds.  EXTREMITIES:  Without edema, 2+ pedals bilaterally.  Skin without rashes.   LABORATORY DATA:  Hemoglobin 12.1, hematocrit 35.3, platelets 251, wbc 9.4.  Sodium 142, potassium 3.7, BUN 12, creatinine 0.8, glucose 116.  Protime  12.4, INR 0.9, PTT 32.  CK-MB 1.5, troponin I less than 0.05, myoglobin  65.9, negative for MI.   EKG:  Sinus rhythm without acute changes from most recent EKG.   PLAN:  Currently in the emergency room, awaiting M.D. plan.  We discussed  repeat cath versus Cardiolite, versus medication adjustment.  Patient does  not trust Cardiolites as they have been normal in the past when she truly  had coronary disease, therefore, Dr. Elsie Lincoln on call for Dr. Tresa Endo will make  decisions concerning her treatment plan.      Darcella Gasman. [place], N.P.      Cristy Hilts. Jacinto Halim, MD  Electronically Signed    LRI/MEDQ  D:  04/24/2005  T:  04/26/2005  Job:  [id number]

## 2010-09-20 NOTE — Cardiovascular Report (Signed)
NAMESOLSTICE, LASTINGER            ACCOUNT NO.:  [id number]   MEDICAL RECORD NO.:  [id number]          PATIENT TYPE:  INP   LOCATION:  6523                         FACILITY:  [facility]   PHYSICIAN:  Nicki Guadalajara, M.D.     DATE OF BIRTH:  09-23-1945   DATE OF PROCEDURE:  04/10/2005  DATE OF DISCHARGE:                              CARDIAC CATHETERIZATION   INDICATIONS:  [name] is a 65 year old patient who has known  coronary artery disease. Please refer to the phase one of her stage  intervention done on April 07, 2005 for complete details of her cardiac  history. On April 07, 2005, she underwent PTCA stenting of diffuse  proximal to mid-RCA stenoses. She is now brought back the laboratory several  days later for two-vessel coronary planned stage II vessel coronary  intervention to the circumflex marginal and LAD system.   PROCEDURE:  After premedication with Valium 5 milligrams intravenously, the  patient was prepped and draped in the usual fashion. The patient had  participated in the [place] study protocol several days ago and has been  maintained on Plavix therapy. Double bolus Integrilin/weight-adjusted  heparinization was administered and ACT was documented therapeutic. The left  femoral artery was punctured anteriorly and a 6-French sheath was inserted.   A diagnostic right catheter was first inserted to insure continued patency  of the intervention from April 07, 2005 and this revealed widely patent  stent in the mid-right coronary artery. Attention was then directed at the  left coronary system. A 6-French FL-4 guide was used for the procedure. A  marker wire was then advanced down the circumflex marginal vessel. Tandem  lesions were noted being 50-60% at the ostium, followed by 90-95%  proximally. Predilatation was done with a 3.0 x 15 mm balloon. A 3.5 x 18 mm  drug-eluting Cypher stent was then advanced and careful attention was made  to insure that this  would cover the ostial lesion, but not in any way  jeopardize flow down the AV groove circumflex. The stent was dilated x2 up  to 16 atmospheres. Poststent dilatation was done utilizing a 3.75 x 12 mm  Quantum balloon with dilatation up to 3.75 mm. Scout angiography confirmed  an excellent angiographic result. Attention was therefore directed at the  LAD system. The same wire was then advanced down the LAD system which had  been previously shown to have an 80% stenosis beyond the second diagonal  vessel and septal perforating artery. Careful attention was made to insure  that there was not jailing of this diagonal and septal perforator and a 3.0  x 13 mm drug-eluting Cypher stent was inserted. Two dilatations were done up  to 16 atmospheres. Poststent dilatation was done with a 3.25 x 12 mm Quantum  balloon. Scout angiography confirmed an excellent angiographic result.  During the procedure, the patient received numerous doses of intracoronary  nitroglycerin, she also received several doses of intravenous fentanyl and  received additional heparin to insure adequate therapeutic ACT recordings.   HEMODYNAMIC DATA:  Central aortic pressure was 130/77.   ANGIOGRAPHIC DATA:  Review of the native  right coronary artery confirmed  widely patent stent to the previously placed stented segment.   The left circumflex vessel was a large-caliber vessel that had 20% narrowing  at the ostium of the AV groove portion and 50% narrowing at the ostium of  the circumflex marginal followed by focal 90-95% stenosis on a moderate bend  in this circumflex marginal vessel. Following PTCA, stenting with a 3.5 x 18  mm Cypher stent with post dilatation up to 3.75 mm, the circumflex marginal  lesions were reduced to 0%. There was no impingement on the AV groove  circumflex. There was brisk TIMI III flow. There was no evidence for  dissection.   The LAD had previously noted diffuse irregularity proximally of  30-40%  before the first diagonal. After the second diagonal vessel, a moderate-  sized septal perforating artery immediately arose and just following this  septal perforating artery was diffuse narrowing of 80%. Then careful  attention was made to insure that the branches were not jailed and following  insertion of a 3.0 x 13 mm Cypher stent postdilated to 3.25 mm, the region  was reduced to 0%. There was TIMI III flow. There was no evidence for  dissection.   IMPRESSION:  1.  Successful two-vessel percutaneous coronary intervention involving the      left circumflex ostium and proximal marginal vessel with a 50% and 90%      stenoses being reduced to 0%, and percutaneous transluminal coronary      angioplasty/stenting of the mid-left anterior descending artery with 80%      stenosis being reduced to 0%.  2.  Double bolus Integrilin/weight-adjusted heparinization. The patient is      participating in the [place] trial as of initial stage intervention on      April 07, 2005.           ______________________________  Nicki Guadalajara, M.D.     TK/MEDQ  D:  04/10/2005  T:  04/10/2005  Job:  [id number]   cc:   Teena Irani. Arlyce Dice, M.D.  Fax: [phone]   Catheterization Laboratory

## 2010-09-20 NOTE — H&P (Signed)
NAME:  Amber [name], Amber [name] NO.:  [id number]   MEDICAL RECORD NO.:  [id number]                   PATIENT TYPE:   LOCATION:                                       FACILITY:   PHYSICIAN:  Jene Every, M.D.                 DATE OF BIRTH:  10/24/1945   DATE OF ADMISSION:  09/30/2002  DATE OF DISCHARGE:                                HISTORY & PHYSICAL   CHIEF COMPLAINT:  Right shoulder pain.   HISTORY:  The patient is a 65 year old female who has noted pain in her  shoulders for quite some time.  She has a positive impingement sign on her  right shoulder and negative secondary impingement sign.  She is nontender  over the [facility] joint at time of exam.  Plain x-rays revealed degenerative  changes of the [facility] with type II acromion.  The patient was initially treated  with corticosteroid injection.  The patient returned several weeks after the  injection and states that she had increasing pain of her shoulder.  She had  lost some range of motion and was unable to do much activity.  MRI was  obtained that showed significant changes over the [facility] joint with marked edema  in the adjacent bone, moderate tendonopathy supraspinatus with thin but no  definite rotator cuff tear.  As noted, she did have an elongated coracoid  process.  It was felt at that time the patient failed conservative  treatment, that she is disabled with [facility] arthrosis and impingement syndrome.  It was felt she would benefit from a distal clavicle resection and  acromioplasty.  The risks and benefits of surgery were discussed with the  patient in detail by Dr. Shelle Iron, and she wishes to proceed, obtaining medical  clearance from Dr. Arlyce Dice.   PAST MEDICAL HISTORY:  1. Hypertension.  2. Hypercholesterolemia.  3. Coronary artery disease.  4. Gastroesophageal reflux disease.   CURRENT MEDICATIONS:  1. Lipitor 80 mg 1 p.o. daily.  2. Norvasc 5 mg 1 p.o. daily.  3. Nexium 40 mg 1 p.o. daily.  4. Toprol  50 mg 1 p.o. daily.  5. Aspirin daily.  6. Zetia 10 mg 1 p.o. daily.  7. Darvocet p.r.n. pain.   ALLERGIES:  No known drug allergies.   PAST SURGICAL HISTORY:  1. Hysterectomy.  2. Cholecystectomy.  3. Angioplasty.   SOCIAL HISTORY:  The patient is married.  She works as a Geophysicist/field seismologist.  She denies any tobacco or alcohol intake.  She lives in a [place] home.  Her husband will be her caregiver following surgery.   FAMILY HISTORY:  Maternal grandmother had late onset diabetes mellitus and  colon cancer.   REVIEW OF SYSTEMS:  GENERAL:  No fever, chills, night sweats, or bleeding  tendencies.  CNS: No blurred or double vision, seizure, headache, or  paralysis.  RESPIRATORY:  No shortness of breath, productive cough, or  hemoptysis.  CARDIOVASCULAR:  No chest pain, angina, or orthopnea.  GU:  No  dysuria, hematuria, or discharge.  GI:  No nausea, vomiting, diarrhea,  constipation, melena, or bloody stools.  MUSCULOSKELETAL:  Pertinent as in  HPI.   PHYSICAL EXAMINATION:  VITAL SIGNS:  Pulse 80, respirations 16, blood  pressure 118/82.  GENERAL:  Well developed, well nourished, 65 year old female in no acute  distress.  HEENT:  Atraumatic, normocephalic.  Pupils are equal, round, and reactive to  light.  EOMs intact.  NECK:  Supple, no lymphadenopathy.  CHEST:  Clear to auscultation bilaterally.  HEART: Regular rate and rhythm without murmurs, rubs, or gallops.  ABDOMEN:  Soft, nontender, nondistended.  Bowel sounds x 4.  BREASTS/GU:  Not examined, not pertinent.  SKIN:  No rashes or lesions are noted.  EXTREMITIES:  The patient does have positive impingement sign, negative  secondary impingement sign.  She is visibly tender over the [facility] joint.  Positive cross-over maneuver.  There is some weakness noted with external  rotation.   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   PLAN:  The patient will be admitted to [facility] to undergo right  distal clavicle resection and acromioplasty with possible open rotator cuff  repair.  She does have medical clearance from Dr. Arlyce Dice.     Roma Schanz, P.A.                   Jene Every, M.D.    CS/MEDQ  D:  09/30/2002  T:  09/30/2002  Job:  [id number]

## 2010-09-20 NOTE — H&P (Signed)
Amber [name], Amber [name]            ACCOUNT NO.:  [id number]   MEDICAL RECORD NO.:  [id number]          PATIENT TYPE:  INP   LOCATION:  6523                         FACILITY:  [facility]   PHYSICIAN:  Nicki Guadalajara, M.D.     DATE OF BIRTH:  [birth year]   DATE OF ADMISSION:  04/07/2005  DATE OF DISCHARGE:                                HISTORY & PHYSICAL   Amber [name] is a pleasant 65 year old female who has a history of a prior  angioplasty in [place] in the 1980s.  She had follow-up  catheterization in [place] that was reportedly normal.  We did a Cardiolite  in May 2005 that was negative for ischemia.  She was seen in the office in  November with chest pain consistent with angina.  She said symptoms were  exertional and associated with some diaphoresis.  She had actually been  having this for a couple months.  Symptoms were relieved with rest.  She was  set up for an outpatient catheterization at the [facility], which was done  on April 02, 2005.  She revealed a 90% RCA and 90% OM-1, 80% LAD, normal  LV function, normal renals, normal iliacs and patent LIMA.  After discussion  with the patient, it was decided proceed with a staged intervention and she  is admitted now for this.   PAST MEDICAL HISTORY:  Remarkable for treated hypertension.  She has treated  dyslipidemia.  She had a history of back surgery in August 2005.   CURRENT MEDICATIONS:  1.  Aspirin 81 mg a day.  2.  Nexium 40 mg b.i.d.  3.  Toprol XL 75 mg a day.  4.  Lotrel 5/10 mg q.d.  5.  Vytorin 10/40 mg daily.   She has no known drug allergies.   SOCIAL HISTORY:  She is married with three children and two grandchildren.  She is a nonsmoker.   FAMILY HISTORY:  Unremarkable for coronary disease.   REVIEW OF SYSTEMS:  Essentially unremarkable except for that noted above.  She has had some fatigue prior to this admission.  She had been unable to  exercise because of some back trouble.  She has received some  back  injections by Dr. Jene Every.   Previous surgeries include hysterectomy and cholecystectomy.   PHYSICAL EXAMINATION:  VITAL SIGNS:  Blood pressure 132/86, pulse 88,  respirations 12, BMI is48.  GENERAL:  She is a morbidly obese female in no acute distress.  HEENT: Normocephalic, atraumatic.  Extraocular movements are intact.  Sclerae are nonicteric.  NECK:  Without JVD and without bruit.  CHEST:  Clear to auscultation and percussion.  CARDIAC:  Regular rate and rhythm without murmur, rub or gallop.  Normal S1,  S2.  ABDOMEN:  Morbidly obese, nontender.  EXTREMITIES:  Without edema.  Distal pulses are intact.  NEUROLOGIC:  Intact.  She is awake, alert and oriented, cooperative, moves  all extremities without deficit.  SKIN:  Warm and dry.   IMPRESSION:  1.  Exertional angina with outpatient catheterization revealing three-vessel      coronary disease.  2.  Known coronary  disease with previous percutaneous coronary intervention      in the 22s in [place].  3.  Hypertension with a history of diastolic dysfunction.  4.  Treated hyperlipidemia.  5.  Morbid obesity.  6.  Degenerative joint disease with prior back surgery.  7.  Gastroesophageal reflux.   PLAN:  The patient is admitted for elective intervention.      Abelino Derrick, P.A.    ______________________________  Nicki Guadalajara, M.D.    Lenard Lance  D:  04/11/2005  T:  04/11/2005  Job:  [id number]

## 2010-09-20 NOTE — Cardiovascular Report (Signed)
NAMELEVADA, BOWERSOX            ACCOUNT NO.:  [id number]   MEDICAL RECORD NO.:  [id number]          PATIENT TYPE:  INP   LOCATION:  6523                         FACILITY:  [facility]   PHYSICIAN:  Nicki Guadalajara, M.D.     DATE OF BIRTH:  Jul 14, 1945   DATE OF PROCEDURE:  04/10/2005  DATE OF DISCHARGE:                              CARDIAC CATHETERIZATION   INDICATIONS:  Ms. Kirk Sampley is a very pleasant 65 year old female who  was known coronary artery disease. She is status post initial PTCA in the  early 1980s in [place] and apparently had undergone repeat  catheterization in the early 1990s in [place]. She has history of  hyperlipidemia, metabolic syndrome, hypertension and recently has developed  exertional chest pain and shortness of breath. She underwent diagnostic  catheterization on April 02, 2005 at [facility] which showed  normal LV function with a suggestion of mild concentric LVH. She was found  to have three-vessel coronary artery disease with irregularity of the  proximal LAD with focal 80% mid-LAD stenosis after the second diagonal  vessel and septal perforating artery. The circumflex vessel was a large  vessel that had 50% followed by 90% stenosis in the ostium proximal portion  of this marginal vessel with mild narrowing of the ostium of the AV groove  circumflex. The right coronary artery had diffuse 90% proximal to mid-  stenoses. The patient was started on Plavix therapy and hydrated following  her diagnostic catheterization on April 07, 2005. She is now admitted to  [facility] for planned stage intervention involving her RCA,  circumflex marginal, LAD system.   PROCEDURE:  Today: PCI of the right coronary artery with PTCA stenting.   PROCEDURE:  After premedication with Valium intravenously, the patient was  prepped and draped in the usual fashion. The right femoral artery was  punctured anteriorly and a 6-French sheath was  inserted. A 6-French FR-4  guide was inserted and confirmed a long 90% proximal RCA stenoses. The  patient had agreed to participate in the [employer] trial and for this reason  was given study drug with Plavix per protocol. In addition, heparin 5000  units and Integrilin were administered. Following documentation of  therapeutic anticoagulation, a Luge wire was advanced down the RCA. The  patient also started on IV nitroglycerin and was also given IC nitroglycerin  down the coronary artery. A predilatation was done with a 2.0 x 30 mm stent  and ending with a balloon Maverick balloon with ultimate dilation up to 9  atmospheres. A Cypher 3.0 x 33 mm drug-eluting stent was then advanced and  successfully deployed with maximum inflation up to 16 atmospheres. Two  dilatations were made to insure optimal stent deployment. Poststent  dilatation was done utilizing a 3.25 x 23 mm Quantum balloon with dilatation  up to 12 atmospheres corresponding to 3.25 mm size. Scout angiography  confirmed an excellent angiographic result. Plans are for continued  hydration, Champion/Plavix study drug, and planned two-vessel PCI to the  left coronary system on Thursday April 10, 2005.   She left the laboratory in stable condition.  HEMODYNAMIC DATA:  Central aortic pressure was 151/89, mean 118.   ANGIOGRAPHIC DATA:  The right coronary artery was moderate-sized vessel that  had diffuse narrowing in the proximal to mid-segment of 80% up to 95% and  then 70%. In addition, there was mild 10-20% proximal narrowing. The RCA  supplied a small PDA and moderate PLA system. Following PTCA and ultimate  stenting with a 3.0 x 33 mm Cypher stent covering the entire region of 80%,  95%, and 70% stenoses, this whole region was reduced to 0%. There was  postdilated 3.2 mm. There was TIMI III flow. There was no evidence for  dissection.   IMPRESSION:  1.  Successful percutaneous transluminal coronary angioplasty stenting  of      the segmental long 80%, 95% and 70% proximal and diffuse right coronary      artery stenosis with ultimate utilization of a 3.0 x 33 mm drug-eluting      Cypher stent postdilated to 3.25 mm.  2.  Alla Feeling study drug.  3.  Integrilin/weight-adjusted heparinization.           ______________________________  Nicki Guadalajara, M.D.     TK/MEDQ  D:  04/10/2005  T:  04/10/2005  Job:  [id number]   cc:   Teena Irani. Arlyce Dice, M.D.  Fax: [phone]   Patient's chart   Cardiac Catheterization Lab

## 2010-09-20 NOTE — Discharge Summary (Signed)
Amber [name], Amber [name]            ACCOUNT NO.:  [id number]   MEDICAL RECORD NO.:  [id number]          PATIENT TYPE:  INP   LOCATION:  4707                         FACILITY:  [facility]   PHYSICIAN:  Richard A. Alanda Amass, M.D.DATE OF BIRTH:  December 21, 1945   DATE OF ADMISSION:  04/24/2005  DATE OF DISCHARGE:  04/25/2005                                 DISCHARGE SUMMARY   ADMISSION DIAGNOSIS:  Recurrent chest pain.   DISCHARGE DIAGNOSES:  1.  Coronary artery disease, progression of disease with staged percutaneous      coronary intervention December 4, December 9, by Dr. Tresa Endo, using drug-      eluting stent to the right coronary artery December 4, drug-eluting      stent to the obtuse marginal and left anterior descending artery      April 10, 2005.  2.  Metabolic syndrome.  3.  Exogenous obesity.  4.  Gastroesophageal reflux disease.  5.  Systemic hypertension.  6.  Remote back surgery.   BRIEF HISTORY:  The patient is a 65 year old white female diabetic obese  patient of Dr. Tresa Endo with recent episodes of angina, which sounded anginal  in character.  It was two to three weeks after three-vessel stenting, and  she presented with recurrent chest discomfort both with exertion and rest.  Because of that, she was admitted to rule out recurrent stenosis.   She was seen the following day by Dr. Alanda Amass and taken to the  catheterization lab.  There she was found to have left main coronary artery  was large with no significant stenosis.  The proximal LAD had 50% narrowing.  The stent in the LAD was patent.  The distal LAD and diagonals were normal.  The circumflex was patent.  The stent in the OM was patent.  The RCA showed  a proximal 40% stenosis, followed by a 30% stenosis.  A stent in the RCA was  patent.  At completion of the study, it was Dr. Kandis Cocking opinion that her  chest pain etiology was unclear but probably related to upper GI disease.  She had noncritical residual disease,  predominantly in the proximal LAD,  that is segmental and angiographically appears to be about 50%.  He  recommended GI medication and reassurance.  She is being referred for weight  reduction and cardiac rehab and increasing her PPIs.  An outpatient  Cardiolite was also recommended as possible assistance in baseline for  further assessment.   DISCHARGE MEDICATIONS:  1.  Nexium 40 mg daily.  2.  Carafate 1 g before meals and at bedtime.  3.  Plavix 75 mg daily.  4.  Aspirin 325 mg.  5.  Toprol XL 75 mg daily.  6.  Vytorin 10/40 mg one daily.  7.  Imdur 30 mg daily.  8.  Lotrel 5/20 mg one daily.  9.  She is to restart Fortamet on April 27, 2005.   She will return to see Dr. Tresa Endo in two weeks.  She is to follow up with Dr.  Arlyce Dice.  She is also to call our office if she had recurrent chest pain and  requires three nitroglycerin.   LABORATORY DATA:  White count is 9.6, hemoglobin is 11.7, hematocrit is  33.8, platelets are 276,000.  CK and troponins were all negative.  LFTs were  normal.  Electrolytes were normal, glucose was 135, BUN was 11, creatinine  was 0.8 after catheterization.   CONDITION ON DISCHARGE:  Stable.      Eber Hong, P.A.      Richard A. Alanda Amass, M.D.  Electronically Signed    WDJ/MEDQ  D:  06/10/2005  T:  06/11/2005  Job:  [id number]   cc:   Teena Irani. Arlyce Dice, M.D.  Fax: [phone]

## 2010-09-20 NOTE — Op Note (Signed)
NAMESTEVEN, VEAZIE A                      ACCOUNT NO.:  [id number]   MEDICAL RECORD NO.:  [id number]                   PATIENT TYPE:  AMB   LOCATION:  DAY                                  FACILITY:  [facility]   PHYSICIAN:  Jene Every, M.D.                 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   PROCEDURE:  Lateral recess decompression at L5-S1, foraminotomy L5 and S1,  partial medial hemifacetectomy L5-S1, microdiskectomy L5-S1.   ANESTHESIA:  General.   ASSISTANT:  Roma Schanz, P.A.   INDICATIONS FOR PROCEDURE:  This is a 65 year old with L5-S1 radiculopathy  secondary to multifactorial spinal stenosis with associated HNP at L5-S1  compressing the L5 and S1 nerve roots.  She had been refractory to  conservative treatment and therefore was indicated for decompression of the  L5 and S1 nerve roots and microdiskectomy. The risks and benefits were  discussed including bleeding, infection, injury to neurovascular structures,  CSF leakage, epidural fibrosis, adjacent segment disease, need for fusion in  the future.   TECHNIQUE:  The patient in the supine position after an adequate level of  general anesthesia and 1 g of Kefzol, she was placed prone on the Andrew's  frame and all bony prominences well padded.  The lumbar region was prepped  and draped in the usual sterile fashion. An 18 gauge spinal needle was  utilized to localize the L5-S1 interspace confirmed with X-ray. An incision  was made from the spinous process to S1. The subcutaneous tissue was  dissected, electrocautery was utilized to achieve hemostasis. The  dorsolumbar fascia was identified and divided in line with the skin  incisions. The paraspinous  muscles were elevated from the lamina of 5 and  S1, McCullough retractor was placed, Penfield 4 in the intralaminar space  confirmed with x-ray at L5-S1. Hemilaminotomy in the caudad edge of 5 was  performed with a 2 and a 3 mm Kerrison, augmented with a high speed bur.  The ligamentum flavum was attached from the cephalad edge of S1.  Removed  the partial portion of the lateral aspect of the ligamentum flavum,  preserved the  medial portion. Formal foraminotomy of S1, identified the S1  nerve root and gently mobilized it medially.  I then under cut the facet to  perform a partial medial hemifacetectomy to the medial border of the  pedicle. Severe stenosis of L5 noted, multifactorial. Foraminotomy was then  performed with 2 and 3 mm Kerrison protecting the nerve at all times at L5.  There was a lateral disk herniation noted and annulotomy was performed.  Copious portion of disk material was removed from the disk space and in the  subannular space this was mobilized with an Epstein and treated with  pituitary without difficulty. A hockey stick probe placed in the foramen of  5 and found to be widely patent following the foraminotomy. Bipolar  electrocautery was utilized to achieve hemostasis.  Inspection revealed no  evidence of active bleeding or CSF leakage. Full mobilization of the L5 and  S1 nerve roots.  The disk space was copiously irrigated, thrombin soaked  Gelfoam was placed into the laminotomy defect.  The McCullough retractor was  removed, paraspinous muscles inspected with no evidence of active bleeding.  The dorsolumbar fascia reapproximated with #1 Vicryl interrupted figure-of-  eight sutures, subcutaneous tissue reapproximated with 2-0 Vicryl simple  sutures, skin was reapproximated with 4-0 subcuticular Prolene. The wound  was reinforced with Steri-Strips, sterile dressing applied, placed supine on  the hospital bed, extubated without difficulty and transported to the   recovery room in satisfactory condition.   The patient tolerated the procedure well with no complications.                                               Jene Every, M.D.    Cordelia Pen  D:  12/20/2003  T:  12/20/2003  Job:  [id number]

## 2010-09-20 NOTE — Cardiovascular Report (Signed)
NAMEODILE, VELOSO            ACCOUNT NO.:  [id number]   MEDICAL RECORD NO.:  [id number]          PATIENT TYPE:  INP   LOCATION:  4707                         FACILITY:  [facility]   PHYSICIAN:  Richard A. Alanda Amass, M.D.DATE OF BIRTH:  Mar 29, 1946   DATE OF PROCEDURE:  04/25/2205  DATE OF DISCHARGE:  04/25/2005                              CARDIAC CATHETERIZATION   PROCEDURES:  Coronary angiography via Judkins technique.  Pre-and-post IC  nitroglycerin administration, LV angiogram RAO, LAO injection, abdominal  aortic angiogram midstream PA projection hand injection, right femoral  angiogram hand injection, right common femoral artery closure with 6-French  Angio-Seal closure device with 1 gm Ancef IV antibiotic prophylaxis  successful.   PROCEDURE:  The patient was brought to the second floor CP lab in  postabsorptive state with Demerol 5 mg p.o. premedication. Heparin was on  hold coming to the catheterization lab. The patient was given intermittent  fentanyl 25 mcg IV x2 for sedation; 1% Xylocaine was used for local  anesthesia. The CRA was entered with a single anterior puncture using 18  thin-wall needle and with modified Seldinger technique.  A 6-French short  Daig sidearm sheath was inserted without difficulty. Diagnostic coronary  angiography was done pre-and-post IC nitroglycerin administration 200 mcg  with 6-French, 4-cm taper, preformed Cordis coronary catheters anterior.   Pigtail catheter 6-French Cordis was used for LV angiogram in the RAO  projection with 25 mL, 14 mL/second, and hand injection of the LAO  projection. Pullback pressure of the CA showed no gradient across the aortic  valve. Guide wire exchange was used throughout the procedure, Omnipaque dye  was used. The patient was hydrated preoperatively. Abdominal angiogram was  done by hand injection above the level of the renal arteries revealing  single normal renal arteries bilaterally and essentially normal  infrarenal  abdominal aorta. Catheters were removed. Following right common femoral  angiogram a 6-French AngioSeal device was used successfully to close the  puncture site. The patient was given 1 gram of Ancef IV antibiotic  prophylaxis __________ postoperative appearance in stable condition.   PRESSURE:  LV: 140/0; LVEDP 14-60 mmHg.   CA:  140/80 mmHg.   BRIEF HISTORY:  Ms. Larocque is a very pleasant, married, mother of three  with two grandchildren who is from [place], [place]. She has children in  [place] and in [place]. She has a long history of coronary disease and  had PTCA (p.o. BA) in [place] in 1984. She had not required any  further interventions until recently. She is a nonsmoker, has exogenous  obesity, hyperlipidemia, metabolic syndrome, GERD, and systemic hypertension  treated medically.  Recently underwent went staged PCI for symptomatic  coronary disease by Dr. Tresa Endo after the outpatient catheterization and right  coronary high-grade stenosis with PDO of 3.0/33/ with DES stent on  04/07/2005.   He had a staged procedure on 04/10/2005 with CXOM treated with a 3.5 x 18  CYPHER stent post dilated with 375 mean and LAD treated with a 3.0, 13  CYPHER, post dilated with a 3.25 balloon by Dr. Tresa Endo.   The patient tolerated the procedures  well and was discharged April 11, 2005. She was readmitted April 24, 2005 with substernal chest pressure at  rest, and vaguely with exertion which she felt was similar to a prior  angina. She also has significant GERD symptoms along with her exogenous  obesity.  Myocardial infarction was ruled out with serial enzymes and EKGs  and the patient was agreeable to recatheterization after being admitted by  Dr. Elsie Lincoln, and referred for this. She has also had prior back surgery in  2005 and has chronic mild discomfort related to this.   ANGIOGRAMS:  Fluoroscopy on live fluoroscopy reveals 1+ proximal LAD and  circumflex and  right coronary calcification. The stents were well visualized  in the LAD, CX, OM large and dominant RCA.   The main left coronary had no significant stenosis and was a large vessel.   The proximal LAD had approximately 50% angiographic narrowing, just beyond  the ostia up to the second diagonal branch and the recently placed LAD  stent. Multiple projections did not reveal any high-grade stenosis and there  was good flow with no dissection or filling defect. The LAD stent beyond the  small DX-2 and ending before the small bifurcating DX-3, minus 10% residual  with excellent apposition and excellent flow. The remainder of the LAD was  relatively small but coursed through the apex of the heart, and there was no  significant stenosis. The small DX-1, small DX-2, with small DX-3 have no  significant stenosis.   The circumflex was nondominant but gave off a large marginal branch in the  proximal third. The stent was placed in that beautifully, widely patent and  0% residual narrowing and normal flow to this large branch. The circumflex  proper and small PABG branch had no significant stenosis.   The right coronary was a dominant vessel. There was no in-stent restenosis  with 0% residual segmentally in the mid RCA. There was 30% narrowing  proximal to the stent, and 40% narrowing in the proximal third of the RCA.  The distal PDA and PLA were patent and were moderate-to-small size but with  normal flow.   The etiology of the patient's chest pain is unclear; this may be related to  upper GI disease. She has noncritical residual disease predominantly in the  proximal LAD that is segmental and angiographically appears to be about 50%.  I would reassure the patient, increased her GI medications.  She will also  be referred for weight reduction and cardiac rehab, and increased PPIs.  Outpatient Cardiolite may be helpful as a baseline, and for further  assessment.  CATHETERIZATION DIAGNOSIS:   1.  __________, remote POBA in 1984 [place].  2.  Progression of disease with staged PCI 12/04 and 04/12/2005 by Dr. Tresa Endo      1.  RCA/DES CYPHER stent 12/04.      2.  CXOM and LAD proximal-mid stent DES 04/10/2005.  3.  Metabolic syndrome.  4.  Hyperlipidemia.  5.  Exogenous obesity.  6.  Gastroesophageal reflux disease  7.  Systemic hypertension, normal renal arteries.  8.  Remote back surgery 2005.      Richard A. Alanda Amass, M.D.  Electronically Signed     RAW/MEDQ  D:  04/25/2005  T:  04/27/2005  Job:  [id number]   cc:   Nicki Guadalajara, M.D.  Fax: [phone]   Teena Irani. Arlyce Dice, M.D.  Fax: [phone]

## 2010-09-20 NOTE — Discharge Summary (Signed)
NAMEGISSELLE, Amber [name]            ACCOUNT NO.:  [id number]   MEDICAL RECORD NO.:  [id number]          PATIENT TYPE:  INP   LOCATION:  6523                         FACILITY:  [facility]   PHYSICIAN:  Nicki Guadalajara, M.D.     DATE OF BIRTH:  10-28-1945   DATE OF ADMISSION:  04/07/2005  DATE OF DISCHARGE:  04/11/2005                                 DISCHARGE SUMMARY   DISCHARGE DIAGNOSES:  1.  Coronary disease status post staged intervention this admission.  2.  Remote coronary disease with percutaneous coronary intervention in the      1980s in [place].  3.  Treated hyperlipidemia.  4.  Treated hypertension.  5.  Morbid obesity.  6.  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   HOSPITAL COURSE:  The patient is a 65 year old female who is followed by Dr.  Tresa Endo. She had a remote intervention in [place] in  the 80s. She was  cathed after this in [place] and apparently was fine. We did do a  Cardiolite in 2005 that was low risk. She presented to the office in  November with complaints of increasing chest pain that was exertional and  consistent with unstable angina. She was set up for a diagnostic  catheterization [facility] and  revealed 90% RCA, 90% OM1 and 80% LAD with normal LV function. After review  of the films, it was decided proceed with a staged intervention. The patient  was admitted dec 94, 2006, and underwent CYPHER stenting to the RCA. She  tolerated this well. She was kept in the hospital and underwent staged  intervention with CYPHER stents to the OM1 and LAD on April 10, 2005. We  feel she can be discharged April 11, 2005.   DISCHARGE MEDICATIONS:  1.  Imdur 30 mg a day.  2.  Toprol XL 75 mg a day.  3.  Lotrel 5/20 daily.  4.  Vytorin 10/40 daily.  5.  Coated aspirin daily.  6.  Plavix 75 mg a day.  7.  Nexium 40 mg a day.  8.  Nitroglycerin sublingual p.r.n.   LABORATORY DATA:  Sodium 142, potassium 3.6,  BUN 8, creatinine 0.7.  White  count 9.5, hemoglobin 11.2, hematocrit 32.3, platelets 193.  INR 1.0. CK,  MB, Troponins negative x2.   Chest X-Ray: Shows heart size to be at the upper limits of normal. She has a  stable right lower lobe nodule, no active disease.   EKG shows sinus rhythm with low voltage but no acute changes.   DISPOSITION:  The patient discharged in stable condition and will follow-up  with Dr. Tresa Endo in a few weeks in the office.      Abelino Derrick, P.A.    ______________________________  Nicki Guadalajara, M.D.    Lenard Lance  D:  04/11/2005  T:  04/11/2005  Job:  [id number]

## 2010-09-26 ENCOUNTER — Encounter: Payer: Self-pay | Admitting: Family Medicine

## 2010-09-27 ENCOUNTER — Other Ambulatory Visit: Payer: Self-pay | Admitting: Family Medicine

## 2010-10-07 ENCOUNTER — Ambulatory Visit (INDEPENDENT_AMBULATORY_CARE_PROVIDER_SITE_OTHER): Payer: Medicare Other | Admitting: Family Medicine

## 2010-10-07 ENCOUNTER — Encounter: Payer: Self-pay | Admitting: Family Medicine

## 2010-10-07 DIAGNOSIS — G4730 Sleep apnea, unspecified: Secondary | ICD-10-CM

## 2010-10-07 DIAGNOSIS — H5710 Ocular pain, unspecified eye: Secondary | ICD-10-CM

## 2010-10-07 DIAGNOSIS — IMO0001 Reserved for inherently not codable concepts without codable children: Secondary | ICD-10-CM

## 2010-10-07 DIAGNOSIS — H5712 Ocular pain, left eye: Secondary | ICD-10-CM

## 2010-10-07 DIAGNOSIS — I10 Essential (primary) hypertension: Secondary | ICD-10-CM

## 2010-10-07 DIAGNOSIS — F341 Dysthymic disorder: Secondary | ICD-10-CM

## 2010-10-07 DIAGNOSIS — R0902 Hypoxemia: Secondary | ICD-10-CM

## 2010-10-07 HISTORY — DX: Hypoxemia: R09.02

## 2010-10-07 NOTE — Assessment & Plan Note (Signed)
Patient and her husband both agree that the symptoms had improved with the addition of summer we'll medications but slowly began to worsen again over the last month with no obvious trigger or change in regimen.

## 2010-10-07 NOTE — Assessment & Plan Note (Signed)
Well controlled no change in meds today 

## 2010-10-07 NOTE — Patient Instructions (Signed)
Hypertension (High Blood Pressure) As your heart beats, it forces blood through your arteries. This force is your blood pressure. If the pressure is too high, it is called hypertension (HTN) or high blood pressure. HTN is dangerous because you may have it and not know it. High blood pressure may mean that your heart has to work harder to pump blood. Your arteries may be narrow or stiff. The extra work puts you at risk for heart disease, stroke, and other problems.  Blood pressure consists of two numbers, a higher number over a lower, 110/72, for example. It is stated as "110 over 72." The ideal is below 120 for the top number (systolic) and under 80 for the bottom (diastolic). Write down your blood pressure today. You should pay close attention to your blood pressure if you have certain conditions such as:  Heart failure.  Prior heart attack.   Diabetes   Chronic kidney disease.   Prior stroke.   Multiple risk factors for heart disease.   To see if you have HTN, your blood pressure should be measured while you are seated with your arm held at the level of the heart. It should be measured at least twice. A one-time elevated blood pressure reading (especially in the Emergency Department) does not mean that you need treatment. There may be conditions in which the blood pressure is different between your right and left arms. It is important to see your caregiver soon for a recheck. Most people have essential hypertension which means that there is not a specific cause. This type of high blood pressure may be lowered by changing lifestyle factors such as:  Stress.  Smoking.   Lack of exercise.   Excessive weight.  Drug/tobacco/alcohol use.   Eating less salt.   Most people do not have symptoms from high blood pressure until it has caused damage to the body. Effective treatment can often prevent, delay or reduce that damage. TREATMENT Treatment for high blood pressure, when a cause has been  identified, is directed at the cause. There are a large number of medications to treat HTN. These fall into several categories, and your caregiver will help you select the medicines that are best for you. Medications may have side effects. You should review side effects with your caregiver. If your blood pressure stays high after you have made lifestyle changes or started on medicines,   Your medication(s) may need to be changed.   Other problems may need to be addressed.   Be certain you understand your prescriptions, and know how and when to take your medicine.   Be sure to follow up with your caregiver within the time frame advised (usually within two weeks) to have your blood pressure rechecked and to review your medications.   If you are taking more than one medicine to lower your blood pressure, make sure you know how and at what times they should be taken. Taking two medicines at the same time can result in blood pressure that is too low.  SEEK IMMEDIATE MEDICAL CARE IF YOU DEVELOP:  A severe headache, blurred or changing vision, or confusion.   Unusual weakness or numbness, or a faint feeling.   Severe chest or abdominal pain, vomiting, or breathing problems.  MAKE SURE YOU:   Understand these instructions.   Will watch your condition.   Will get help right away if you are not doing well or get worse.  Document Released: 04/21/2005 Document Re-Released: 10/09/2009 [employer] Patient Information 2011 [employer]   For the eye pain, can try alternating ice and heat over eye with moist clothes and may use Tramadol/APAP as needed for pain, call your Opthamologist, Dr Emily Filbert, for further evaluation. If his evaluation is negative and the pain persists call us so we can arrange for neurologic evaluation

## 2010-10-07 NOTE — Assessment & Plan Note (Signed)
Has been using her CPAP but for unclear reasons also requires supplemental O2 both qhs and during the day at times, in the office she only has a pulse ox of 89% on RA, she does not have a pulmonogist here so we will refer for further evaluation and treatment

## 2010-10-08 DIAGNOSIS — H269 Unspecified cataract: Secondary | ICD-10-CM | POA: Insufficient documentation

## 2010-10-08 HISTORY — DX: Unspecified cataract: H26.9

## 2010-10-08 NOTE — Assessment & Plan Note (Signed)
Patient in today with her husband and roughly one-month history of left arm pain. She describes the pain as intermittent and fleeting. Does note over the last week the intensity and frequency of the pain has increasing so that brings her in today. She had one earlier in the week were actually awakened her and kept her awake for quite some time as the pain was more persistent at that time. For most of the visit today she does not have any pain and then towards the end of the visit she does have some pain. She describes the pain as sharp and somewhat stabbing feeling like it's behind her eye. There is no associated visual changes headache or other neurologic symptoms although she does note she occasionally has a very similar but much less intense discomfort in the right eye also over this last month. She has an ongoing relationship with her ophthalmologist she will call there when she gets home today to schedule her next appointment as soon as possible to rule out any acute process in the eye if this work up is negative and the pain persists she will call so we may consider a neurologic work up

## 2010-10-08 NOTE — Progress Notes (Signed)
Amber [name] [id number] Jul 11, 1945 10/08/2010      Progress Note-Follow Up  Subjective  Chief Complaint  Chief Complaint  Patient presents with  . Eye Pain    X  days    HPI  Patient is a 65 year old Caucasian female who is in today with her husband with concerns regarding left-sided eye pain. She reports a roughly a month now she's been having intermittent episodes of sharp pains shooting through her left eye. She feels as if it's in the back of her eye. She has no associated scotomata, blind spots, blurry vision, photophobia, floaters or halos. She notes initially the symptom was infrequent and quick. He still tends to resolve in your seconds this happening more frequently and she had one night earlier in the week when she was out up most of the night with discomfort. Today she does not have pain initially although she does have a small amount of pain just before the visit in. He reports he has had similar symptoms in the right but they're much less intense and much less frequent and she's not had any of those for several weeks. She denies any headache, congestion, fevers, chills, facial pain or temporal pain. No recent hearing changes or pain in the ears. No chest pain, palpitations, shortness of breath. She does have a long history of depression and chronic pain/myalgias which have been improved but slowly began to worsen again over the last month as well.  Past Medical History  Diagnosis Date  . Thyroid disease   . Hyperlipidemia   . Hypertension     5 stents  . Arthritis   . Depression   . Diabetes mellitus     type 2  . Hypoxia 10/07/2010    Past Surgical History  Procedure Date  . Angioplasty 1980  . Angioplasty 2006    3 stents  . Angioplasty 2009    2 stents  . Cholecystectomy   . Thyroidectomy 1950    for goiter  . Abdominal hysterectomy 1977    partial  . Diverticulitis 1978    exploratory  . Right rotator cuff repair 2004  . Left rotator cuff repair 2004    . Bone fusion left foot 2010    7 screws in foot, bunionectomy  . Hip surgery     large bone spur removed, right  . Back surgery 2004    lumbar discectomy for bulging disc    Family History  Problem Relation Age of Onset  . Heart disease Father   . Diabetes Sister   . Hypertension Sister   . Depression Sister   . Cancer Sister     colon cancer s/p colectomy  . Emphysema Brother     smoker  . Diabetes Daughter   . Hypertension Daughter   . Depression Daughter   . Diverticulitis Daughter   . Other Daughter     Trichotrilomania  . Aneurysm Maternal Grandmother     brain aneurysm  . Diabetes Paternal Grandmother   . Hypertension Paternal Grandmother   . Cancer Paternal Grandmother     colon  . Crohn's disease Paternal Grandfather   . Emphysema Brother     smoker  . Heart disease Brother   . Diabetes Brother   . Cancer Brother     thyroid  . Coronary artery disease Brother     s/p MI's and 7 stents  . Diabetes Brother   . Other Brother     back disease w/ bulging discs  .  Colon polyps Brother     esophageal and tongue polyps  . Diabetes Sister   . Other Sister     short term memory loss  . Aneurysm Sister     brain aneursm s/p coiling  . Diabetes Son   . Anxiety disorder Son     History   Social History  . Marital Status: Married    Spouse Name: N/A    Number of Children: N/A  . Years of Education: N/A   Occupational History  . Not on file.   Social History Main Topics  . Smoking status: Never Smoker   . Smokeless tobacco: Never Used  . Alcohol Use: No  . Drug Use: No  . Sexually Active: Yes -- Female partner(s)   Other Topics Concern  . Not on file   Social History Narrative  . No narrative on file    Current Outpatient Prescriptions on File Prior to Visit  Medication Sig Dispense Refill  . amLODipine-benazepril (LOTREL) 5-20 MG per capsule TAKE 1 CAPSULE BY MOUTH EVERY DAY FOR BLOOD PRESSURE--INS WILL PAY ON 11/17/09  90 capsule  0  .  aspirin 81 MG tablet Take 81 mg by mouth daily.        Marland Kitchen BREWERS YEAST PO Take by mouth daily.        . carvedilol (COREG) 12.5 MG tablet Take 12.5 mg by mouth 2 (two) times daily.        . Cholecalciferol (VITAMIN D) 2000 UNITS CAPS Take by mouth daily.        . Cholecalciferol (VITAMIN D-3 PO) Take 2 tablets by mouth daily.       . citalopram (CELEXA) 40 MG tablet Take 40 mg by mouth daily.        . clopidogrel (PLAVIX) 75 MG tablet Take 75 mg by mouth daily.        . CYMBALTA 60 MG capsule TAKE ONE CAPSULE TWICE A DAY  60 capsule  3  . ezetimibe-simvastatin (VYTORIN) 10-80 MG per tablet Take 1 tablet by mouth daily.        Marland Kitchen gabapentin (NEURONTIN) 300 MG capsule Take 300 mg by mouth 4 (four) times daily.        . isosorbide mononitrate (IMDUR) 60 MG 24 hr tablet Take 60 mg by mouth daily.        Marland Kitchen levothyroxine (SYNTHROID, LEVOTHROID) 25 MCG tablet Take 25 mcg by mouth daily.        . magnesium oxide (MAG-OX) 400 MG tablet Take 400 mg by mouth daily.        . metFORMIN (GLUCOPHAGE) 1000 MG tablet Take 1,000 mg by mouth 2 (two) times daily with a meal.        . NON FORMULARY Gaba 500 mg- bid       . NON FORMULARY Kava Kava 150 mg- bid       . NON FORMULARY [place] Rosea 500 mg- daily       . pantoprazole (PROTONIX) 40 MG tablet Take 40 mg by mouth daily.        . Probiotic Product (PROBIOTIC FORMULA PO) Take by mouth.        . sucralfate (CARAFATE) 1 G tablet Take 1 g by mouth as needed.        . vitamin B-12 (CYANOCOBALAMIN) 1000 MCG tablet Take 1,000 mcg by mouth daily.          Allergies  Allergen Reactions  . Percocet (Oxycodone-Acetaminophen) Itching    Review  of Systems  Review of Systems  Constitutional: Positive for malaise/fatigue. Negative for fever.  HENT: Negative for congestion.   Eyes: Positive for pain. Negative for blurred vision, double vision, photophobia, discharge and redness.  Respiratory: Negative for shortness of breath.   Cardiovascular: Negative for  chest pain, palpitations and leg swelling.  Gastrointestinal: Negative for nausea, abdominal pain and diarrhea.  Genitourinary: Negative for dysuria.  Musculoskeletal: Positive for myalgias. Negative for falls.  Skin: Negative for rash.  Neurological: Negative for loss of consciousness and headaches.  Endo/Heme/Allergies: Negative for polydipsia.  Psychiatric/Behavioral: Positive for depression. Negative for suicidal ideas. The patient is not nervous/anxious and does not have insomnia.     Objective  BP 114/73  Pulse 89  Temp(Src) 98.2 F (36.8 C) (Oral)  Ht 5\' 1"  (1.549 m)  Wt 252 lb 12.8 oz (114.669 kg)  BMI 47.77 kg/m2  SpO2 85%  Physical Exam  Physical Exam  Constitutional: She is oriented to person, place, and time and well-developed, well-nourished, and in no distress. No distress.  HENT:  Head: Normocephalic and atraumatic.  Eyes: Conjunctivae are normal. Pupils are equal, round, and reactive to light. Right eye exhibits no discharge. Left eye exhibits no discharge. No scleral icterus.  Neck: Neck supple. No thyromegaly present.  Cardiovascular: Normal rate, regular rhythm and normal heart sounds.   No murmur heard. Pulmonary/Chest: Effort normal and breath sounds normal. She has no wheezes.  Abdominal: She exhibits no distension and no mass.  Musculoskeletal: She exhibits no edema.  Lymphadenopathy:    She has no cervical adenopathy.  Neurological: She is alert and oriented to person, place, and time.  Skin: Skin is warm and dry. No rash noted. She is not diaphoretic.  Psychiatric: Memory, affect and judgment normal.     Lab Results  Component Value Date   WBC 10.3 09/25/2009   HGB 12.9 09/25/2009   HCT 39.1 09/25/2009   MCV 91.5 09/25/2009   PLT 222 09/25/2009   Lab Results  Component Value Date   CREATININE 0.93 09/25/2009   BUN 17 09/25/2009   NA 141 09/25/2009   K 4.0 09/25/2009   CL 103 09/25/2009   CO2 32 09/25/2009   Lab Results  Component Value Date    ALT 15 09/25/2009   AST 19 09/25/2009   ALKPHOS 95 09/25/2009   BILITOT 0.3 09/25/2009   Lab Results  Component Value Date   CHOL  Value: 137        ATP III CLASSIFICATION:  <200     mg/dL   Desirable  045-409  mg/dL   Borderline High  >=811    mg/dL   High        01/17/7828   Lab Results  Component Value Date   HDL 39* 08/01/2008   Lab Results  Component Value Date   LDLCALC  Value: 77        Total Cholesterol/HDL:CHD Risk Coronary Heart Disease Risk Table                     Men   Women  1/2 Average Risk   3.4   3.3  Average Risk       5.0   4.4  2 X Average Risk   9.6   7.1  3 X Average Risk  23.4   11.0        Use the calculated Patient Ratio above and the CHD Risk Table to determine the patient's CHD Risk.  ATP III CLASSIFICATION (LDL):  <100     mg/dL   Optimal  161-096  mg/dL   Near or Above                    Optimal  130-159  mg/dL   Borderline  045-409  mg/dL   High  >811     mg/dL   Very High 01/17/7828   Lab Results  Component Value Date   TRIG 103 08/01/2008   Lab Results  Component Value Date   CHOLHDL 3.5 08/01/2008     Assessment & Plan  UNSPECIFIED MYALGIA AND MYOSITIS Patient and her husband both agree that the symptoms had improved with the addition of summer we'll medications but slowly began to worsen again over the last month with no obvious trigger or change in regimen.  SLEEP APNEA Has been using her CPAP but for unclear reasons also requires supplemental O2 both qhs and during the day at times, in the office she only has a pulse ox of 89% on RA, she does not have a pulmonogist here so we will refer for further evaluation and treatment  ESSENTIAL HYPERTENSION, BENIGN Well controlled no change in meds today  Left eye pain Patient in today with her husband and roughly one-month history of left arm pain. She describes the pain as intermittent and fleeting. Does note over the last week the intensity and frequency of the pain has increasing so that brings her in  today. She had one earlier in the week were actually awakened her and kept her awake for quite some time as the pain was more persistent at that time. For most of the visit today she does not have any pain and then towards the end of the visit she does have some pain. She describes the pain as sharp and somewhat stabbing feeling like it's behind her eye. There is no associated visual changes headache or other neurologic symptoms although she does note she occasionally has a very similar but much less intense discomfort in the right eye also over this last month. She has an ongoing relationship with her ophthalmologist she will call there when she gets home today to schedule her next appointment as soon as possible to rule out any acute process in the eye if this work up is negative and the pain persists she will call so we may consider a neurologic work up  ANXIETY DEPRESSION Patient reports adequate response to Citalopram, no changes today

## 2010-10-08 NOTE — Assessment & Plan Note (Signed)
Patient reports adequate response to Citalopram, no changes today

## 2010-10-13 ENCOUNTER — Other Ambulatory Visit: Payer: Self-pay | Admitting: Family Medicine

## 2010-10-14 NOTE — Telephone Encounter (Signed)
Pt last seen 10/07/10, to follow up in one month.  I can not find record of our office prescribing.  Is it OK to fill?

## 2010-10-14 NOTE — Telephone Encounter (Signed)
OK to refill both with same sig and refills

## 2010-10-15 ENCOUNTER — Other Ambulatory Visit: Payer: Self-pay

## 2010-10-15 NOTE — Telephone Encounter (Signed)
pts spouse called stating they need all of pts medication refilled for a 90 day supply. Pts spouse would like them printed and he will come by to get them.

## 2010-10-16 NOTE — Telephone Encounter (Signed)
OK to refill meds for 90 days with 3 rf. Just need to clarify with patient which one's she needs

## 2010-10-18 ENCOUNTER — Ambulatory Visit (INDEPENDENT_AMBULATORY_CARE_PROVIDER_SITE_OTHER): Payer: Medicare Other | Admitting: Family Medicine

## 2010-10-18 ENCOUNTER — Encounter: Payer: Self-pay | Admitting: Family Medicine

## 2010-10-18 VITALS — BP 128/74 | HR 85 | Temp 98.3°F | Ht 61.0 in | Wt 252.8 lb

## 2010-10-18 DIAGNOSIS — F329 Major depressive disorder, single episode, unspecified: Secondary | ICD-10-CM

## 2010-10-18 DIAGNOSIS — L989 Disorder of the skin and subcutaneous tissue, unspecified: Secondary | ICD-10-CM

## 2010-10-18 DIAGNOSIS — E119 Type 2 diabetes mellitus without complications: Secondary | ICD-10-CM

## 2010-10-18 DIAGNOSIS — B09 Unspecified viral infection characterized by skin and mucous membrane lesions: Secondary | ICD-10-CM

## 2010-10-18 DIAGNOSIS — I10 Essential (primary) hypertension: Secondary | ICD-10-CM

## 2010-10-18 DIAGNOSIS — F32A Depression, unspecified: Secondary | ICD-10-CM

## 2010-10-18 DIAGNOSIS — B088 Other specified viral infections characterized by skin and mucous membrane lesions: Secondary | ICD-10-CM

## 2010-10-18 DIAGNOSIS — E039 Hypothyroidism, unspecified: Secondary | ICD-10-CM

## 2010-10-18 DIAGNOSIS — G4730 Sleep apnea, unspecified: Secondary | ICD-10-CM

## 2010-10-18 DIAGNOSIS — G609 Hereditary and idiopathic neuropathy, unspecified: Secondary | ICD-10-CM

## 2010-10-18 DIAGNOSIS — E785 Hyperlipidemia, unspecified: Secondary | ICD-10-CM

## 2010-10-18 HISTORY — DX: Disorder of the skin and subcutaneous tissue, unspecified: L98.9

## 2010-10-18 HISTORY — DX: Unspecified viral infection characterized by skin and mucous membrane lesions: B09

## 2010-10-18 MED ORDER — METFORMIN HCL 1000 MG PO TABS: 1000.0000 mg | ORAL_TABLET | Freq: Two times a day (BID) | ORAL | Status: DC

## 2010-10-18 MED ORDER — EZETIMIBE-SIMVASTATIN 10-40 MG PO TABS: 1.0000 | ORAL_TABLET | Freq: Every day | ORAL | Status: DC

## 2010-10-18 MED ORDER — CITALOPRAM HYDROBROMIDE 40 MG PO TABS: ORAL_TABLET | ORAL | Status: DC

## 2010-10-18 MED ORDER — LEVOTHYROXINE SODIUM 25 MCG PO TABS: 25.0000 ug | ORAL_TABLET | Freq: Every day | ORAL | Status: DC

## 2010-10-18 MED ORDER — GABAPENTIN 300 MG PO CAPS: 600.0000 mg | ORAL_CAPSULE | Freq: Two times a day (BID) | ORAL | Status: DC

## 2010-10-18 MED ORDER — ISOSORBIDE MONONITRATE ER 60 MG PO TB24: 60.0000 mg | ORAL_TABLET | Freq: Every day | ORAL | Status: DC

## 2010-10-18 MED ORDER — ACYCLOVIR 400 MG PO TABS: 400.0000 mg | ORAL_TABLET | Freq: Every day | ORAL | Status: DC

## 2010-10-18 MED ORDER — AMLODIPINE BESY-BENAZEPRIL HCL 5-20 MG PO CAPS: 1.0000 | ORAL_CAPSULE | Freq: Every day | ORAL | Status: DC

## 2010-10-18 MED ORDER — CARVEDILOL 12.5 MG PO TABS: 12.5000 mg | ORAL_TABLET | Freq: Two times a day (BID) | ORAL | Status: DC

## 2010-10-18 MED ORDER — CLOPIDOGREL BISULFATE 75 MG PO TABS: 75.0000 mg | ORAL_TABLET | Freq: Every day | ORAL | Status: DC

## 2010-10-18 MED ORDER — DULOXETINE HCL 60 MG PO CPEP: 60.0000 mg | ORAL_CAPSULE | Freq: Every day | ORAL | Status: DC

## 2010-10-18 NOTE — Assessment & Plan Note (Signed)
Patient is very frustrated with her weight and is most frustrated with her abdominal pannus. They are trying to reduce portion sizes and she has been trying to exercise more, wa in the pool yesterday. They have loosely been following the Mediterranean diet. She is given a handout on the DASH diet and they are asked to consider that for a framework. We will start monthly weight checks. She is not interested in Gastric Bypass at this point but would like to consider a panulectomy due to skin irritation and discomfort.

## 2010-10-18 NOTE — Patient Instructions (Signed)
Obesity Obesity is defined as having a Body Mass Index (BMI) of 30 or more. To calculate your BMI divide your weight in pounds by your height in inches squared and multiply that product by 703. Major illnesses resulting from long-term obesity include:  Stroke.   Heart disease.   Diabetes.   Many cancers.   Arthritis.  Obesity also complicates recovery from many other medical problems.  CAUSES  A history of obesity in your parents.   Thyroid hormone imbalance.   Environmental factors such as excess calorie intake and physical inactivity.  TREATMENT A healthy weight loss program includes:  A calorie restricted diet based on individual calorie needs.   Increased physical activity (exercise).  An exercise program is just as important as the right low calorie diet.  Weight-loss medicines should be used only under the supervision of your physician. These medicines help, but only if they are used with diet and exercise programs. Medicines can have side effects including nervousness, nausea, abdominal pain, diarrhea, headache, drowsiness, and depression.  An unhealthy weight loss program includes:  Fasting.   Fad diets.   Supplements and drugs.  These choices do not succeed in long-term weight control.  HOME CARE INSTRUCTIONS To help you make the needed dietary changes:   Keep a daily record of everything you eat. There are many free websites to help you with this. It may be helpful to measure your foods so you can determine if you are eating the correct portion sizes.   Use low-calorie cookbooks or take special cooking classes.   Avoid alcohol. Drink more water and drinks with no calories.   Take vitamins and supplements only as recommended by your caregiver.   Weight-loss support groups, Optometrist, counselors, and stress reduction education can also be very helpful.  PREVENTION Losing weight and keeping it off takes time, discipline, a healthy diet and regular  exercise. Document Released: 05/29/2004 Document Re-Released: 05/11/2007 [facility] Patient Information 2011 [place], [place].  Try DASH diet

## 2010-10-18 NOTE — Assessment & Plan Note (Signed)
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 Has partially responded to Abbreva topically. Will start a course of Acyclovir 400 mg po 5 x daily x 7-10 days and they will call if lesion does not respond.

## 2010-10-18 NOTE — Assessment & Plan Note (Signed)
New lesion noticed by patient's husband this week, has arisen quickly and is irregularly shaped, they have an ongoing relationship with a Dermatologist so they have agreed to call and have the lesion evaluated.

## 2010-10-18 NOTE — Assessment & Plan Note (Signed)
No recent difficulties, sugars adequately controlled, avoid simple carbs. No change in meds at this time

## 2010-10-18 NOTE — Assessment & Plan Note (Signed)
Has an appt next week for evaluation of her hypoxia and sleep apnea/pulmonary disease

## 2010-10-18 NOTE — Telephone Encounter (Signed)
Refills discussed and given at visit today.

## 2010-10-18 NOTE — Assessment & Plan Note (Signed)
Well controlled, no change in therapy today

## 2010-10-19 NOTE — Progress Notes (Signed)
Amber [name] [id number] [birth year] 10/19/2010      Progress Note-Follow Up  Subjective  Chief Complaint  Chief Complaint  Patient presents with  . Nevus    on back    HPI  Patient is in today with multiple concerns accompanied by her husband. They scheduled an appointment today because he noticed a new lesion on her posterior left shoulder that is raised and irregular in changing. They deny that it is painful bleeds or itches. They worked they are also concerned about her weight and most specifically her abdominal pannus. They disagree with but the patient herself would like to have this surgically corrected. Her husband smoked more worried about taking multiple medical conditions. She reports that the pannus causes her discomfort makes it difficult to do the daily activities causes some back pain and skin irritation at times. She is not showing any interest when asked about gastric bypass but instead is interested in obtaining electively. She reports several of her friends have undergone this. She's not to date tried any systematic medical visits to assist with weight loss. She denies any recent febrile illness, headache, chest pain, palpitations, shortness of breath that is changed, GI or GU concerns.   Past Medical History  Diagnosis Date  . Thyroid disease   . Hyperlipidemia   . Hypertension     5 stents  . Arthritis   . Depression   . Diabetes mellitus     type 2  . Hypoxia 10/07/2010  . Skin lesion of back 10/18/2010  . Viral infection characterized by skin and mucous membrane lesions 10/18/2010    Past Surgical History  Procedure Date  . Angioplasty 1980  . Angioplasty 2006    3 stents  . Angioplasty 2009    2 stents  . Cholecystectomy   . Thyroidectomy 1950    for goiter  . Abdominal hysterectomy 1977    partial  . Diverticulitis 1978    exploratory  . Right rotator cuff repair 2004  . Left rotator cuff repair 2004  . Bone fusion left foot 2010    7 screws  in foot, bunionectomy  . Hip surgery     large bone spur removed, right  . Back surgery 2004    lumbar discectomy for bulging disc    Family History  Problem Relation Age of Onset  . Heart disease Father   . Diabetes Sister   . Hypertension Sister   . Depression Sister   . Cancer Sister     colon cancer s/p colectomy  . Emphysema Brother     smoker  . Diabetes Daughter   . Hypertension Daughter   . Depression Daughter   . Diverticulitis Daughter   . Other Daughter     Trichotrilomania  . Aneurysm Maternal Grandmother     brain aneurysm  . Diabetes Paternal Grandmother   . Hypertension Paternal Grandmother   . Cancer Paternal Grandmother     colon  . Crohn's disease Paternal Grandfather   . Emphysema Brother     smoker  . Heart disease Brother   . Diabetes Brother   . Cancer Brother     thyroid  . Coronary artery disease Brother     s/p MI's and 7 stents  . Diabetes Brother   . Other Brother     back disease w/ bulging discs  . Colon polyps Brother     esophageal and tongue polyps  . Diabetes Sister   . Other Sister  short term memory loss  . Aneurysm Sister     brain aneursm s/p coiling  . Diabetes Son   . Anxiety disorder Son     History   Social History  . Marital Status: Married    Spouse Name: N/A    Number of Children: N/A  . Years of Education: N/A   Occupational History  . Not on file.   Social History Main Topics  . Smoking status: Never Smoker   . Smokeless tobacco: Never Used  . Alcohol Use: No  . Drug Use: No  . Sexually Active: Yes -- Female partner(s)   Other Topics Concern  . Not on file   Social History Narrative  . No narrative on file    Current Outpatient Prescriptions on File Prior to Visit  Medication Sig Dispense Refill  . amLODipine-benazepril (LOTREL) 5-20 MG per capsule Take 1 capsule by mouth daily.  30 capsule  3  . aspirin 81 MG tablet Take 81 mg by mouth daily.        Marland Kitchen BREWERS YEAST PO Take by mouth daily.         . carvedilol (COREG) 12.5 MG tablet Take 1 tablet (12.5 mg total) by mouth 2 (two) times daily.  60 tablet  3  . Cholecalciferol (VITAMIN D) 2000 UNITS CAPS Take by mouth daily.        . Cholecalciferol (VITAMIN D-3 PO) Take 2 tablets by mouth daily.       . clopidogrel (PLAVIX) 75 MG tablet Take 1 tablet (75 mg total) by mouth daily.  30 tablet  3  . DULoxetine (CYMBALTA) 60 MG capsule Take 1 capsule (60 mg total) by mouth daily.  30 capsule  3  . gabapentin (NEURONTIN) 300 MG capsule Take 2 capsules (600 mg total) by mouth 2 (two) times daily.  120 capsule  3  . isosorbide mononitrate (IMDUR) 60 MG 24 hr tablet Take 1 tablet (60 mg total) by mouth daily.  30 tablet  3  . levothyroxine (SYNTHROID, LEVOTHROID) 25 MCG tablet Take 1 tablet (25 mcg total) by mouth daily.  30 tablet  3  . Magnesium 400 MG CAPS Take 1 capsule by mouth daily.        . magnesium oxide (MAG-OX) 400 MG tablet Take 400 mg by mouth daily.        . metFORMIN (GLUCOPHAGE) 1000 MG tablet Take 1 tablet (1,000 mg total) by mouth 2 (two) times daily with a meal.  30 tablet  3  . NON FORMULARY Gaba 500 mg- bid       . NON FORMULARY Kava Kava 150 mg- bid       . NON FORMULARY [place] Rosea 500 mg- daily       . pantoprazole (PROTONIX) 40 MG tablet Take 40 mg by mouth daily.        . Probiotic Product (PROBIOTIC FORMULA PO) Take by mouth.        . sucralfate (CARAFATE) 1 G tablet Take 1 g by mouth as needed.        . vitamin B-12 (CYANOCOBALAMIN) 1000 MCG tablet Take 1,000 mcg by mouth daily.          Allergies  Allergen Reactions  . Percocet (Oxycodone-Acetaminophen) Itching    Review of Systems  Review of Systems  Constitutional: Negative for fever and malaise/fatigue.  HENT: Negative for congestion.   Eyes: Negative for discharge.  Respiratory: Negative for shortness of breath.   Cardiovascular: Negative for chest  pain, palpitations and leg swelling.  Gastrointestinal: Negative for nausea, abdominal pain and  diarrhea.  Genitourinary: Negative for dysuria.  Musculoskeletal: Positive for myalgias and back pain. Negative for falls.  Skin: Positive for rash.       Under abdominal pannus at times New lesion over posterior left shoulder blade. Raised, irregular, growing  Neurological: Negative for loss of consciousness and headaches.  Endo/Heme/Allergies: Negative for polydipsia.  Psychiatric/Behavioral: Negative for depression and suicidal ideas. The patient is not nervous/anxious and does not have insomnia.     Objective  BP 128/74  Pulse 85  Temp(Src) 98.3 F (36.8 C) (Oral)  Ht 5\' 1"  (1.549 m)  Wt 252 lb 12.8 oz (114.669 kg)  BMI 47.77 kg/m2  SpO2 91%  Physical Exam  Physical Exam  Constitutional: She is oriented to person, place, and time. She appears well-developed and well-nourished. No distress.  HENT:  Head: Normocephalic and atraumatic.  Nose: Nose normal.  Eyes: Conjunctivae are normal. Right eye exhibits no discharge. No scleral icterus.  Neck: Normal range of motion. Neck supple.  Cardiovascular: Normal rate, regular rhythm and normal heart sounds.   No murmur heard. Pulmonary/Chest: Effort normal and breath sounds normal. No respiratory distress.  Abdominal: Soft. Bowel sounds are normal. She exhibits no distension. There is no tenderness. There is no rebound.       Obese, large pannus  Musculoskeletal: Normal range of motion. She exhibits no edema and no tenderness.  Lymphadenopathy:    She has no cervical adenopathy.  Neurological: She is alert and oriented to person, place, and time.  Skin: Skin is warm and dry. She is not diaphoretic. No erythema.       Irregular shape, 1 cm brown lesion with the lower portion darker and scabbed overly posterior left shoulder  Psychiatric: She has a normal mood and affect. Her behavior is normal.      Lab Results  Component Value Date   WBC 10.3 09/25/2009   HGB 12.9 09/25/2009   HCT 39.1 09/25/2009   MCV 91.5 09/25/2009   PLT  222 09/25/2009   Lab Results  Component Value Date   CREATININE 0.93 09/25/2009   BUN 17 09/25/2009   NA 141 09/25/2009   K 4.0 09/25/2009   CL 103 09/25/2009   CO2 32 09/25/2009   Lab Results  Component Value Date   ALT 15 09/25/2009   AST 19 09/25/2009   ALKPHOS 95 09/25/2009   BILITOT 0.3 09/25/2009   Lab Results  Component Value Date   CHOL  Value: 137        ATP III CLASSIFICATION:  <200     mg/dL   Desirable  578-469  mg/dL   Borderline High  >=629    mg/dL   High        09/30/4130   Lab Results  Component Value Date   HDL 39* 08/01/2008   Lab Results  Component Value Date   LDLCALC  Value: 77        Total Cholesterol/HDL:CHD Risk Coronary Heart Disease Risk Table                     Men   Women  1/2 Average Risk   3.4   3.3  Average Risk       5.0   4.4  2 X Average Risk   9.6   7.1  3 X Average Risk  23.4   11.0        Use  the calculated Patient Ratio above and the CHD Risk Table to determine the patient's CHD Risk.        ATP III CLASSIFICATION (LDL):  <100     mg/dL   Optimal  578-469  mg/dL   Near or Above                    Optimal  130-159  mg/dL   Borderline  629-528  mg/dL   High  >413     mg/dL   Very High 2/44/0102   Lab Results  Component Value Date   TRIG 103 08/01/2008   Lab Results  Component Value Date   CHOLHDL 3.5 08/01/2008     Assessment & Plan  Viral infection characterized by skin and mucous membrane lesions Lesion has been recurrent above upper lip for the past month, lesion suspicious for herpes simplex vs herpes zoster. Has partially responded to Abbreva topically. Will start a course of Acyclovir 400 mg po 5 x daily x 7-10 days and they will call if lesion does not respond.  Skin lesion of back New lesion noticed by patient's husband this week, has arisen quickly and is irregularly shaped, they have an ongoing relationship with a Dermatologist so they have agreed to call and have the lesion evaluated.   ESSENTIAL HYPERTENSION, BENIGN Well controlled,  no change in therapy today  MORBID OBESITY Patient is very frustrated with her weight and is most frustrated with her abdominal pannus. They are trying to reduce portion sizes and she has been trying to exercise more, wa in the pool yesterday. They have loosely been following the Mediterranean diet. She is given a handout on the DASH diet and they are asked to consider that for a framework. We will start monthly weight checks. She is not interested in Gastric Bypass at this point but would like to consider a panulectomy due to skin irritation and discomfort.   DM No recent difficulties, sugars adequately controlled, avoid simple carbs. No change in meds at this time  SLEEP APNEA Has an appt next week for evaluation of her hypoxia and sleep apnea/pulmonary disease

## 2010-10-25 ENCOUNTER — Encounter: Payer: Self-pay | Admitting: Pulmonary Disease

## 2010-10-25 ENCOUNTER — Ambulatory Visit (INDEPENDENT_AMBULATORY_CARE_PROVIDER_SITE_OTHER): Payer: Medicare Other | Admitting: Pulmonary Disease

## 2010-10-25 VITALS — BP 118/68 | HR 83 | Temp 98.3°F | Ht 61.0 in | Wt 253.6 lb

## 2010-10-25 DIAGNOSIS — R0600 Dyspnea, unspecified: Secondary | ICD-10-CM | POA: Insufficient documentation

## 2010-10-25 DIAGNOSIS — R0609 Other forms of dyspnea: Secondary | ICD-10-CM

## 2010-10-25 NOTE — Progress Notes (Signed)
SATURATION QUALIFICATIONS:  Patient Saturations on Room Air at Rest = 92%  Patient Saturations on Room Air while Ambulating = 88%  Patient Saturations on 2 Liters of oxygen while Ambulating = 93%

## 2010-10-25 NOTE — Patient Instructions (Signed)
Will set up for breathing studies, and see you back same day to review Will order echo thru Dr. Landry Dyke office Work on weight loss

## 2010-10-25 NOTE — Assessment & Plan Note (Signed)
The pt has significant doe with exertional hypoxemia that I suspect is multifactorial.  I suspect she does not have intrinsic lung disease, but does have restrictive physiology due to her significant centripetal obesity.  Will check full pfts for evaluation.  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  Finally, she has known severe CAD, and is to have stress test with Dr. Tresa Endo next week.  I would also like to check echocardiogram to evaluate for pulmonary htn as well.  She is going to [place] for 3mos in the fall, and will try to sort thru all of this by then.  She will need to continue with oxygen at HS and exertion, but really does not require at rest if sitting quietly.

## 2010-10-25 NOTE — Progress Notes (Signed)
  Subjective:    Patient ID: Amber [name], female    DOB: [birth year], 64 y.o.   MRN: [id number]  HPI The pt is a 64y/o female who I have been asked to see for dyspnea.  She notes doe for years, but feels it is a little worse most recently.  She notes doe at less than one block on flat ground at medium pace, and will get winded bringing groceries in from the car.  She states that any activity of 5-22min results in sob and profuse sweating.  She is morbidly obese, and has been found to have desaturation with ambulation.  She has no h/o smoking, no asthma, and denies cough or mucus production.  Echo in 2010 showed no pulm htn and nl LV fxn.  She has had a ct chest in May of last year, and showed no acute process or evidence for ISLD.  She has had a sleep study, and is wearing cpap.  She also has significant MSK issues that limit her mobility.    Review of Systems  Constitutional: Negative for fever and unexpected weight change.  HENT: Negative for ear pain, nosebleeds, congestion, sore throat, rhinorrhea, sneezing, trouble swallowing, dental problem, postnasal drip and sinus pressure.   Eyes: Negative for redness and itching.  Respiratory: Positive for shortness of breath. Negative for cough, chest tightness and wheezing.   Cardiovascular: Negative for palpitations and leg swelling.  Gastrointestinal: Negative for nausea and vomiting.  Genitourinary: Negative for dysuria.  Musculoskeletal: Positive for joint swelling.  Skin: Negative for rash.  Neurological: Negative for headaches.  Hematological: Does not bruise/bleed easily.  Psychiatric/Behavioral: Negative for dysphoric mood. The patient is not nervous/anxious.        Objective:   Physical Exam Constitutional:  Obese female, no acute distress  HENT:  Nares patent without discharge  Oropharynx without exudate, palate and uvula are elongated  Eyes:  Perrla, eomi, no scleral icterus  Neck:  No JVD, no TMG  Cardiovascular:   Normal rate, regular rhythm, no rubs or gallops.  2/6 murmur        Intact distal pulses  Pulmonary :  Normal breath sounds, no stridor or respiratory distress   No rales, rhonchi, or wheezing.  Small tidal volume breathing  Abdominal:  Soft, nondistended, bowel sounds present.  No tenderness noted.   Musculoskeletal:  No lower extremity edema noted.  Lymph Nodes:  No cervical lymphadenopathy noted  Skin:  No cyanosis noted  Neurologic:  Alert, appropriate, moves all 4 extremities without obvious deficit.         Assessment & Plan:

## 2010-11-08 ENCOUNTER — Ambulatory Visit (HOSPITAL_COMMUNITY)
Admission: RE | Admit: 2010-11-08 | Discharge: 2010-11-08 | Disposition: A | Discharge status: Home / Self Care | Payer: Medicare Other | Source: Ambulatory Visit | Attending: Pulmonary Disease | Admitting: Pulmonary Disease

## 2010-11-08 ENCOUNTER — Ambulatory Visit (INDEPENDENT_AMBULATORY_CARE_PROVIDER_SITE_OTHER): Payer: Medicare Other | Admitting: Pulmonary Disease

## 2010-11-08 ENCOUNTER — Encounter: Payer: Self-pay | Admitting: Pulmonary Disease

## 2010-11-08 VITALS — BP 114/70 | HR 73 | Temp 98.1°F | Ht 61.0 in | Wt 254.0 lb

## 2010-11-08 DIAGNOSIS — R0600 Dyspnea, unspecified: Secondary | ICD-10-CM

## 2010-11-08 DIAGNOSIS — R0602 Shortness of breath: Secondary | ICD-10-CM | POA: Insufficient documentation

## 2010-11-08 DIAGNOSIS — R0609 Other forms of dyspnea: Secondary | ICD-10-CM | POA: Insufficient documentation

## 2010-11-08 DIAGNOSIS — R0989 Other specified symptoms and signs involving the circulatory and respiratory systems: Secondary | ICD-10-CM | POA: Insufficient documentation

## 2010-11-08 NOTE — Progress Notes (Signed)
  Subjective:    Patient ID: Amber [name], female    DOB: [birth year], 65 y.o.   MRN: [id number]  HPI The pt comes in today for f/u after her recent pfts, ordered as part of a w/u for doe unknown origin.  She was noted to have no obstruction, no restriction, and a minimal decrease DLCO.  I have reviewed the study with her and her husband in detail, and answered all of their questions.    Review of Systems  Constitutional: Negative for fever and unexpected weight change.  HENT: Positive for dental problem. Negative for ear pain, nosebleeds, congestion, sore throat, rhinorrhea, sneezing, trouble swallowing, postnasal drip and sinus pressure.   Eyes: Negative for redness and itching.  Respiratory: Positive for shortness of breath. Negative for cough, chest tightness and wheezing.   Cardiovascular: Negative for palpitations and leg swelling.  Gastrointestinal: Negative for nausea and vomiting.  Genitourinary: Negative for dysuria.  Musculoskeletal: Negative for joint swelling.  Skin: Negative for rash.  Neurological: Negative for headaches.  Hematological: Bruises/bleeds easily.  Psychiatric/Behavioral: Negative for dysphoric mood. The patient is not nervous/anxious.        Objective:   Physical Exam Obese female in nad Nares without obvious discharge or purulence LE with mild edema, no cyanosis noted. Alert, oriented, moves all 4 extrem.        Assessment & Plan:

## 2010-11-08 NOTE — Patient Instructions (Signed)
Work on weight loss and conditioning Continue oxygen at HS and with heavy exertional activities Will call you once I receive results of your cardiac workup

## 2010-11-08 NOTE — Assessment & Plan Note (Addendum)
The pt's PFT's are really unrevealing, and she has echo and stress test scheduled for next week.  I continue to believe her doe is multifactorial, and primarily related to her centripetal obesity and deconditioning.  I think her exercise desaturation is probably due to poor v/q matching from her obesity with basilar atelectasis.  I have asked the pt to work on weight loss and conditioning program, and will call them once I receive results of cardiac w/u.  If she continues to have issues despite weight loss, or if her condition worsens, will be happy to see again.

## 2010-11-13 DIAGNOSIS — I2510 Atherosclerotic heart disease of native coronary artery without angina pectoris: Secondary | ICD-10-CM

## 2010-11-13 DIAGNOSIS — I10 Essential (primary) hypertension: Secondary | ICD-10-CM

## 2010-11-13 HISTORY — DX: Essential (primary) hypertension: I10

## 2010-11-13 HISTORY — DX: Atherosclerotic heart disease of native coronary artery without angina pectoris: I25.10

## 2010-11-14 ENCOUNTER — Other Ambulatory Visit: Payer: Self-pay | Admitting: Family Medicine

## 2010-11-14 NOTE — Telephone Encounter (Signed)
Please advise 

## 2010-11-15 ENCOUNTER — Telehealth: Payer: Self-pay | Admitting: Pulmonary Disease

## 2010-11-15 NOTE — Telephone Encounter (Signed)
Called and spoke with pt's spouse.  Spouse aware of echo results. He verbalized understanding, denied any questions and stated he would relay message to pt.

## 2010-11-15 NOTE — Telephone Encounter (Signed)
Please let pt know that her echo shows her heart is ok.

## 2010-11-15 NOTE — Telephone Encounter (Signed)
Called, spoke with pt's husband.  States he was to call [facility] when pt had echo done.  This was done Wednesday at Dr. Landry Dyke office.  Will forward message to [facility] so he is aware.

## 2010-11-18 ENCOUNTER — Other Ambulatory Visit: Payer: Self-pay | Admitting: Family Medicine

## 2010-11-20 ENCOUNTER — Ambulatory Visit (INDEPENDENT_AMBULATORY_CARE_PROVIDER_SITE_OTHER): Payer: Medicare Other | Admitting: Family Medicine

## 2010-11-20 ENCOUNTER — Other Ambulatory Visit: Payer: Self-pay | Admitting: Family Medicine

## 2010-11-20 ENCOUNTER — Encounter: Payer: Self-pay | Admitting: Family Medicine

## 2010-11-20 DIAGNOSIS — M549 Dorsalgia, unspecified: Secondary | ICD-10-CM

## 2010-11-20 DIAGNOSIS — Z79899 Other long term (current) drug therapy: Secondary | ICD-10-CM

## 2010-11-20 DIAGNOSIS — E119 Type 2 diabetes mellitus without complications: Secondary | ICD-10-CM

## 2010-11-20 DIAGNOSIS — G609 Hereditary and idiopathic neuropathy, unspecified: Secondary | ICD-10-CM

## 2010-11-20 DIAGNOSIS — F3289 Other specified depressive episodes: Secondary | ICD-10-CM

## 2010-11-20 DIAGNOSIS — F341 Dysthymic disorder: Secondary | ICD-10-CM

## 2010-11-20 DIAGNOSIS — R5381 Other malaise: Secondary | ICD-10-CM

## 2010-11-20 DIAGNOSIS — E039 Hypothyroidism, unspecified: Secondary | ICD-10-CM

## 2010-11-20 DIAGNOSIS — E559 Vitamin D deficiency, unspecified: Secondary | ICD-10-CM

## 2010-11-20 DIAGNOSIS — F32A Depression, unspecified: Secondary | ICD-10-CM

## 2010-11-20 DIAGNOSIS — G4700 Insomnia, unspecified: Secondary | ICD-10-CM

## 2010-11-20 DIAGNOSIS — D649 Anemia, unspecified: Secondary | ICD-10-CM

## 2010-11-20 DIAGNOSIS — R5383 Other fatigue: Secondary | ICD-10-CM

## 2010-11-20 DIAGNOSIS — E785 Hyperlipidemia, unspecified: Secondary | ICD-10-CM

## 2010-11-20 DIAGNOSIS — F329 Major depressive disorder, single episode, unspecified: Secondary | ICD-10-CM

## 2010-11-20 LAB — HEMOGLOBIN A1C: Hgb A1c MFr Bld: 7.3 % — ABNORMAL HIGH (ref 4.6–6.5)

## 2010-11-20 MED ORDER — DIAZEPAM 5 MG PO TABS: ORAL_TABLET | ORAL | Status: DC

## 2010-11-20 NOTE — Patient Instructions (Signed)
Depression You have signs of depression. This is a common problem. It can occur at any age. It is often hard to recognize. People can suffer from depression and still have moments of enjoyment. Depression interferes with your basic ability to function in life. It upsets your relationships, sleep, eating, and work habits. CAUSES Depression is believed to be caused by an imbalance in brain chemicals. It may be triggered by an unpleasant event. Relationship crises, a death in the family, financial worries, retirement, or other stressors are normal causes of depression. Depression may also start for no known reason. Other factors that may play a part include medical illnesses, some medicines, genetics, and alcohol or drug abuse. SYMPTOMS  Feeling unhappy or worthless.   Long-lasting (chronic) tiredness or worn-out feeling.   Self-destructive thoughts and actions.   Not being able to sleep or sleeping too much.   Eating more than usual or not eating at all.   Headaches or feeling anxious.   Trouble concentrating or making decisions.   Unexplained physical problems and substance abuse.  TREATMENT Depression usually gets better with treatment. This can include:  Antidepressant medicines. It can take weeks before the proper dose is achieved and benefits are reached.   Talking with a therapist, clergyperson, counselor, or friend. These people can help you gain insight into your problem and regain control of your life.   Eating a good diet.   Getting regular physical exercise, such as walking for 30 minutes every day.   Not abusing alcohol or drugs.  Treating depression often takes 6 months or longer. This length of treatment is needed to keep symptoms from returning. Call your caregiver and arrange for follow-up care as suggested. SEEK IMMEDIATE MEDICAL CARE IF:  You start to have thoughts of hurting yourself or others.   Call your local emergency services (911 in [place]   Go to your  local medical emergency department.   Call the National Suicide Prevention Lifeline: 1-800-273-TALK (1-800-273-8255).  Document Released: 04/21/2005 Document Re-Released: 10/09/2009 [employer] Patient Information 2011 [employer]. 

## 2010-11-21 ENCOUNTER — Encounter: Payer: Self-pay | Admitting: Family Medicine

## 2010-11-21 DIAGNOSIS — E559 Vitamin D deficiency, unspecified: Secondary | ICD-10-CM | POA: Insufficient documentation

## 2010-11-21 DIAGNOSIS — M549 Dorsalgia, unspecified: Secondary | ICD-10-CM

## 2010-11-21 HISTORY — DX: Dorsalgia, unspecified: M54.9

## 2010-11-21 LAB — LIPID PANEL
HDL: 52 mg/dL (ref >39.00)
Triglycerides: 162 mg/dL — ABNORMAL HIGH (ref 0.0–149.0)
VLDL: 32.4 mg/dL (ref 0.0–40.0)

## 2010-11-21 LAB — CBC
HCT: 43.2 % (ref 36.0–46.0)
Hemoglobin: 13.8 g/dL (ref 12.0–15.0)
MCH: 31.3 pg (ref 26.0–34.0)
MCHC: 31.9 g/dL (ref 30.0–36.0)
RBC: 4.41 MIL/uL (ref 3.87–5.11)

## 2010-11-21 LAB — HEPATIC FUNCTION PANEL
Alkaline Phosphatase: 96 U/L (ref 39–117)
Bilirubin, Direct: 0 mg/dL (ref 0.0–0.3)
Total Bilirubin: 0.3 mg/dL (ref 0.3–1.2)

## 2010-11-21 LAB — RENAL FUNCTION PANEL
Albumin: 4.3 g/dL (ref 3.5–5.2)
BUN: 26 mg/dL — ABNORMAL HIGH (ref 6–23)
CO2: 32 mEq/L (ref 19–32)
Calcium: 9.7 mg/dL (ref 8.4–10.5)
Chloride: 102 mEq/L (ref 96–112)
Potassium: 4.7 mEq/L (ref 3.5–5.1)

## 2010-11-21 LAB — TSH: TSH: 3.28 u[IU]/mL (ref 0.35–5.50)

## 2010-11-21 MED ORDER — DULOXETINE HCL 60 MG PO CPEP: 60.0000 mg | ORAL_CAPSULE | Freq: Two times a day (BID) | ORAL | Status: DC

## 2010-11-21 MED ORDER — CELECOXIB 200 MG PO CAPS: 200.0000 mg | ORAL_CAPSULE | Freq: Two times a day (BID) | ORAL | Status: DC

## 2010-11-21 MED ORDER — CITALOPRAM HYDROBROMIDE 40 MG PO TABS: 40.0000 mg | ORAL_TABLET | Freq: Every day | ORAL | Status: DC

## 2010-11-21 NOTE — Assessment & Plan Note (Signed)
Will check CBC due to her fatigue and history

## 2010-11-21 NOTE — Progress Notes (Signed)
Amber [name] [id number] 12/21/1945 11/21/2010      Progress Note-Follow Up  Subjective  Chief Complaint  Chief Complaint  Patient presents with  . Excessive Sweating    possible thyroid    HPI  Patient is a 65 yo Caucasian female in today with her husband for evaluation of multiple concerns. Pain over the last couple of weeks she's been having more episodes of hot flashes and sweating several times a day. She's been struggling with more fatigued. He reports to check her sugars when this occurs and they tend to be 110-120. She is also very tearful and sad today. Her twin brother died unexpectedly recently she is having trouble sleeping at times and crying at other times. Recently her orthopedist has started on some Celebrex for some pain and she feels that has been helpful. She is seeing Dr. Shelle Iron of pulmonology and using her CPAP. Has undergone stress testing and is awaiting her results. He is also seeing Dr. Lesle Chris for her skin lesions and is awaiting some skin biopsy results. No CP/palp/SOB/GI or GU c/o.  Past Medical History  Diagnosis Date  . Thyroid disease   . Hyperlipidemia   . Hypertension     5 stents  . Arthritis   . Depression   . Diabetes mellitus     type 2  . Hypoxia 10/07/2010  . Skin lesion of back 10/18/2010  . Viral infection characterized by skin and mucous membrane lesions 10/18/2010  . Vitamin D deficiency 11/21/2010  . Back pain 11/21/2010    Past Surgical History  Procedure Date  . Angioplasty 1980  . Angioplasty 2006    3 stents  . Angioplasty 2009    2 stents  . Cholecystectomy   . Thyroidectomy 1950    for goiter  . Abdominal hysterectomy 1977    partial  . Diverticulitis 1978    exploratory  . Right rotator cuff repair 2004  . Left rotator cuff repair 2004  . Bone fusion left foot 2010    7 screws in foot, bunionectomy  . Hip surgery     large bone spur removed, right  . Back surgery 2004    lumbar discectomy for bulging disc      Family History  Problem Relation Age of Onset  . Heart disease Father   . Diabetes Sister   . Hypertension Sister   . Depression Sister   . Cancer Sister     colon cancer s/p colectomy  . Emphysema Brother     smoker  . Diabetes Daughter   . Hypertension Daughter   . Depression Daughter   . Diverticulitis Daughter   . Other Daughter     Trichotrilomania  . Aneurysm Maternal Grandmother     brain aneurysm  . Diabetes Paternal Grandmother   . Hypertension Paternal Grandmother   . Cancer Paternal Grandmother     colon  . Crohn's disease Paternal Grandfather   . Emphysema Brother     smoker  . Heart disease Brother   . Diabetes Brother   . Cancer Brother     thyroid  . Coronary artery disease Brother     s/p MI's and 7 stents  . Diabetes Brother   . Other Brother     back disease w/ bulging discs  . Colon polyps Brother     esophageal and tongue polyps  . Diabetes Sister   . Other Sister     short term memory loss  . Aneurysm Sister  brain aneursm s/p coiling  . Diabetes Son   . Anxiety disorder Son     History   Social History  . Marital Status: Married    Spouse Name: N/A    Number of Children: N/A  . Years of Education: N/A   Occupational History  . retired Geologist, engineering    Social History Main Topics  . Smoking status: Never Smoker   . Smokeless tobacco: Never Used  . Alcohol Use: No  . Drug Use: No  . Sexually Active: Yes -- Female partner(s)   Other Topics Concern  . Not on file   Social History Narrative  . No narrative on file    Current Outpatient Prescriptions on File Prior to Visit  Medication Sig Dispense Refill  . amLODipine-benazepril (LOTREL) 5-20 MG per capsule Take 1 capsule by mouth daily.  30 capsule  3  . aspirin 81 MG tablet Take 81 mg by mouth daily.        Marland Kitchen BREWERS YEAST PO Take by mouth daily.        . carvedilol (COREG) 12.5 MG tablet Take 1 tablet (12.5 mg total) by mouth 2 (two) times daily.  60 tablet  3  .  Cholecalciferol (VITAMIN D-3 PO) Take 2 tablets by mouth daily.       . clopidogrel (PLAVIX) 75 MG tablet Take 1 tablet (75 mg total) by mouth daily.  30 tablet  3  . ezetimibe-simvastatin (VYTORIN) 10-40 MG per tablet Take 1 tablet by mouth at bedtime.  30 tablet  3  . gabapentin (NEURONTIN) 300 MG capsule Take 2 capsules (600 mg total) by mouth 2 (two) times daily.  120 capsule  3  . isosorbide mononitrate (IMDUR) 60 MG 24 hr tablet Take 1 tablet (60 mg total) by mouth daily.  30 tablet  3  . levothyroxine (SYNTHROID, LEVOTHROID) 25 MCG tablet Take 1 tablet (25 mcg total) by mouth daily.  30 tablet  3  . magnesium oxide (MAG-OX) 400 MG tablet Take 400 mg by mouth daily.        . metFORMIN (GLUCOPHAGE) 1000 MG tablet Take 1 tablet (1,000 mg total) by mouth 2 (two) times daily with a meal.  30 tablet  3  . NON FORMULARY Gaba 500 mg- bid       . NON FORMULARY Kava Kava 150 mg- bid       . NON FORMULARY Rhodeola Rosea 500 mg- daily       . pantoprazole (PROTONIX) 40 MG tablet TAKE 1 TABLET EVERY DAY FOR STOMACH AS NEEDED  90 tablet  2  . Probiotic Product (PROBIOTIC FORMULA PO) Take by mouth.        . sucralfate (CARAFATE) 1 G tablet Take 1 g by mouth as needed.        . vitamin B-12 (CYANOCOBALAMIN) 1000 MCG tablet Take 1,000 mcg by mouth daily.        Marland Kitchen DISCONTD: DULoxetine (CYMBALTA) 60 MG capsule Take 1 capsule (60 mg total) by mouth daily.  30 capsule  3    Allergies  Allergen Reactions  . Percocet (Oxycodone-Acetaminophen) Itching    Review of Systems  Review of Systems  Constitutional: Negative for fever and malaise/fatigue.  HENT: Negative for congestion.   Eyes: Negative for discharge.  Respiratory: Negative for shortness of breath.   Cardiovascular: Negative for chest pain, palpitations and leg swelling.  Gastrointestinal: Negative for nausea, abdominal pain and diarrhea.  Genitourinary: Negative for dysuria.  Musculoskeletal: Negative for falls.  Skin: Negative for rash.    Neurological: Negative for loss of consciousness and headaches.  Endo/Heme/Allergies: Negative for polydipsia.  Psychiatric/Behavioral: Negative for depression and suicidal ideas. The patient is not nervous/anxious and does not have insomnia.     Objective  BP 123/81  Pulse 91  Temp(Src) 98.2 F (36.8 C) (Oral)  Ht 5\' 1"  (1.549 m)  Wt 243 lb 12.8 oz (110.587 kg)  BMI 46.07 kg/m2  SpO2 91%  Physical Exam  Physical Exam  Constitutional: She is oriented to person, place, and time and well-developed, well-nourished, and in no distress. No distress.       Obese, using cane  HENT:  Head: Normocephalic and atraumatic.  Eyes: Conjunctivae are normal.  Neck: Neck supple. No thyromegaly present.  Cardiovascular: Normal rate, regular rhythm and normal heart sounds.   Pulmonary/Chest: Effort normal and breath sounds normal. She has no wheezes.  Abdominal: She exhibits no distension and no mass.  Musculoskeletal: She exhibits no edema.  Lymphadenopathy:    She has no cervical adenopathy.  Neurological: She is alert and oriented to person, place, and time.  Skin: Skin is warm and dry. No rash noted. She is not diaphoretic.  Psychiatric: Memory, affect and judgment normal.    Lab Results  Component Value Date   TSH 3.28 11/20/2010   Lab Results  Component Value Date   WBC 10.4 11/20/2010   HGB 13.8 11/20/2010   HCT 43.2 11/20/2010   MCV 98.0 11/20/2010   PLT 260 11/20/2010   Lab Results  Component Value Date   CREATININE 0.9 11/20/2010   BUN 26* 11/20/2010   NA 142 11/20/2010   K 4.7 11/20/2010   CL 102 11/20/2010   CO2 32 11/20/2010   Lab Results  Component Value Date   ALT 19 11/20/2010   AST 20 11/20/2010   ALKPHOS 96 11/20/2010   BILITOT 0.3 11/20/2010   Lab Results  Component Value Date   CHOL 161 11/20/2010   Lab Results  Component Value Date   HDL 52.00 11/20/2010   Lab Results  Component Value Date   LDLCALC 77 11/20/2010   Lab Results  Component Value Date    TRIG 162.0* 11/20/2010   Lab Results  Component Value Date   CHOLHDL 3 11/20/2010     Assessment & Plan  UNSPECIFIED HYPOTHYROIDISM Patient and husband concerned regarding increased sweating and fatigue recently will reevaluate her thyroid and is asked to adjust lifestyle issues. She is to drink plenty of clear fluids eat small the pills to 8 hours of sleep whenever possible and to exercise as much as possible. She already does do many of the things was encouraged to redouble her efforts  Vitamin D deficiency Patient is supplementing and no treatment told in the past that her levels were low we'll recheck levels at this time.  DM The patient and husband know whenever she's felt poorly for sweaty recently they've been checking her sugars and getting numbers in the 110-120 range. It is time for hemoglobin A1c however so we will check this today and continue her meds at current dosing at this time  ANXIETY DEPRESSION Patient's twin brother died suddenly in the last week and she is very emotional and fall. She is given a small amount of diazepam to help with her grieving process and her psychotropic meds are reviewed with her. She's been on Cymbalta 60 mg twice a day and citalopram 40 mg daily for a long time. She is encouraged to consider letting  us decrease these doses are very slowly over time and not to do this herself do to her high level of medications we will discuss further at her next visit here  ANEMIA Will check CBC due to her fatigue and history  Back pain Was started on Celebrex recently by another physician. Feels it is helping some

## 2010-11-21 NOTE — Assessment & Plan Note (Signed)
Patient's twin brother died suddenly in the last week and she is very emotional and fall. She is given a small amount of diazepam to help with her grieving process and her psychotropic meds are reviewed with her. She's been on Cymbalta 60 mg twice a day and citalopram 40 mg daily for a long time. She is encouraged to consider letting us decrease these doses are very slowly over time and not to do this herself do to her high level of medications we will discuss further at her next visit here

## 2010-11-21 NOTE — Assessment & Plan Note (Signed)
Patient is supplementing and no treatment told in the past that her levels were low we'll recheck levels at this time.

## 2010-11-21 NOTE — Assessment & Plan Note (Signed)
Patient and husband concerned regarding increased sweating and fatigue recently will reevaluate her thyroid and is asked to adjust lifestyle issues. She is to drink plenty of clear fluids eat small the pills to 8 hours of sleep whenever possible and to exercise as much as possible. She already does do many of the things was encouraged to redouble her efforts

## 2010-11-21 NOTE — Assessment & Plan Note (Signed)
Was started on Celebrex recently by another physician. Feels it is helping some

## 2010-11-21 NOTE — Assessment & Plan Note (Signed)
The patient and husband know whenever she's felt poorly for sweaty recently they've been checking her sugars and getting numbers in the 110-120 range. It is time for hemoglobin A1c however so we will check this today and continue her meds at current dosing at this time

## 2010-11-22 NOTE — Progress Notes (Signed)
Addended by: Baldemar Lenis R on: 11/22/2010 08:46 AM   Modules accepted: Orders

## 2010-11-22 NOTE — Progress Notes (Signed)
Addended by: Baldemar Lenis R on: 11/22/2010 08:31 AM   Modules accepted: Orders

## 2010-11-24 LAB — NMR LIPOPROFILE WITHOUT LIPIDS
LDL Particle Number: 1824 nmol/L — ABNORMAL HIGH (ref <1000)
LDL Size: 20.5 nm — ABNORMAL LOW (ref >20.5)
LP-IR Score: 81 — ABNORMAL HIGH (ref <=45)

## 2010-11-27 ENCOUNTER — Other Ambulatory Visit: Payer: Self-pay | Admitting: *Deleted

## 2010-11-27 DIAGNOSIS — I10 Essential (primary) hypertension: Secondary | ICD-10-CM

## 2010-11-27 DIAGNOSIS — G609 Hereditary and idiopathic neuropathy, unspecified: Secondary | ICD-10-CM

## 2010-11-27 DIAGNOSIS — E039 Hypothyroidism, unspecified: Secondary | ICD-10-CM

## 2010-11-27 DIAGNOSIS — E119 Type 2 diabetes mellitus without complications: Secondary | ICD-10-CM

## 2010-11-27 DIAGNOSIS — F329 Major depressive disorder, single episode, unspecified: Secondary | ICD-10-CM

## 2010-11-27 LAB — VITAMIN D 1,25 DIHYDROXY
Vitamin D 1, 25 (OH)2 Total: 50 pg/mL (ref 18–72)
Vitamin D3 1, 25 (OH)2: 50 pg/mL

## 2010-11-27 MED ORDER — CARVEDILOL 12.5 MG PO TABS: 12.5000 mg | ORAL_TABLET | Freq: Two times a day (BID) | ORAL | Status: DC

## 2010-11-27 MED ORDER — PANTOPRAZOLE SODIUM 40 MG PO TBEC: 40.0000 mg | DELAYED_RELEASE_TABLET | Freq: Every day | ORAL | Status: DC

## 2010-11-27 MED ORDER — CITALOPRAM HYDROBROMIDE 40 MG PO TABS: 40.0000 mg | ORAL_TABLET | Freq: Every day | ORAL | Status: DC

## 2010-11-27 MED ORDER — ISOSORBIDE MONONITRATE ER 60 MG PO TB24: 60.0000 mg | ORAL_TABLET | Freq: Every day | ORAL | Status: DC

## 2010-11-27 MED ORDER — GABAPENTIN 300 MG PO CAPS: 600.0000 mg | ORAL_CAPSULE | Freq: Two times a day (BID) | ORAL | Status: DC

## 2010-11-27 MED ORDER — METFORMIN HCL 1000 MG PO TABS: 1000.0000 mg | ORAL_TABLET | Freq: Two times a day (BID) | ORAL | Status: DC

## 2010-11-27 MED ORDER — DULOXETINE HCL 60 MG PO CPEP: 60.0000 mg | ORAL_CAPSULE | Freq: Two times a day (BID) | ORAL | Status: DC

## 2010-11-27 MED ORDER — AMLODIPINE BESY-BENAZEPRIL HCL 5-20 MG PO CAPS: 1.0000 | ORAL_CAPSULE | Freq: Every day | ORAL | Status: DC

## 2010-11-27 MED ORDER — CLOPIDOGREL BISULFATE 75 MG PO TABS: 75.0000 mg | ORAL_TABLET | Freq: Every day | ORAL | Status: DC

## 2010-11-27 MED ORDER — LEVOTHYROXINE SODIUM 25 MCG PO TABS: 25.0000 ug | ORAL_TABLET | Freq: Every day | ORAL | Status: DC

## 2010-11-27 NOTE — Telephone Encounter (Signed)
Pt requested 90 day RX's to be sent to mail order for [place] trip [place].  RXs sent.

## 2010-11-28 ENCOUNTER — Other Ambulatory Visit: Payer: Self-pay | Admitting: *Deleted

## 2010-11-28 ENCOUNTER — Encounter: Payer: Self-pay | Admitting: Pulmonary Disease

## 2010-11-28 DIAGNOSIS — E119 Type 2 diabetes mellitus without complications: Secondary | ICD-10-CM

## 2010-11-28 MED ORDER — METFORMIN HCL 1000 MG PO TABS: 1000.0000 mg | ORAL_TABLET | Freq: Two times a day (BID) | ORAL | Status: DC

## 2010-11-28 NOTE — Telephone Encounter (Signed)
Fax from pharmacy requesting correction on quantity for metformin.  RX sent with correct sig/quantity.

## 2010-12-04 ENCOUNTER — Encounter: Payer: Self-pay | Admitting: Family Medicine

## 2010-12-04 ENCOUNTER — Ambulatory Visit (INDEPENDENT_AMBULATORY_CARE_PROVIDER_SITE_OTHER): Payer: Medicare Other | Admitting: Family Medicine

## 2010-12-04 DIAGNOSIS — T753XXA Motion sickness, initial encounter: Secondary | ICD-10-CM

## 2010-12-04 DIAGNOSIS — R110 Nausea: Secondary | ICD-10-CM

## 2010-12-04 DIAGNOSIS — I10 Essential (primary) hypertension: Secondary | ICD-10-CM

## 2010-12-04 DIAGNOSIS — F341 Dysthymic disorder: Secondary | ICD-10-CM

## 2010-12-04 DIAGNOSIS — G609 Hereditary and idiopathic neuropathy, unspecified: Secondary | ICD-10-CM

## 2010-12-04 DIAGNOSIS — I2510 Atherosclerotic heart disease of native coronary artery without angina pectoris: Secondary | ICD-10-CM

## 2010-12-04 DIAGNOSIS — F329 Major depressive disorder, single episode, unspecified: Secondary | ICD-10-CM

## 2010-12-04 HISTORY — DX: Motion sickness, initial encounter: T75.3XXA

## 2010-12-04 MED ORDER — SCOPOLAMINE 1 MG/3DAYS TD PT72: 1.0000 | MEDICATED_PATCH | TRANSDERMAL | Status: AC

## 2010-12-04 MED ORDER — DULOXETINE HCL 60 MG PO CPEP: 60.0000 mg | ORAL_CAPSULE | Freq: Two times a day (BID) | ORAL | Status: DC

## 2010-12-04 MED ORDER — DULOXETINE HCL 30 MG PO CPEP: ORAL_CAPSULE | ORAL | Status: DC

## 2010-12-04 NOTE — Assessment & Plan Note (Signed)
Well controlled on current doses, no changes

## 2010-12-04 NOTE — Patient Instructions (Signed)

## 2010-12-04 NOTE — Assessment & Plan Note (Signed)
She is struggling with heavy grief over the loss of her twin brother recently. She is sad but handling it. We are going to try and decrease Cymbalta 60mg  in am and 30mg  q pm.  And then titrate off the 30mg  dose after a month as tolerated

## 2010-12-04 NOTE — Assessment & Plan Note (Signed)
Saw cardiology yesterday and after testing they have concluded that her stents are patent. They have dropped her Carvedilol to 6.25mg  bid hoping to decrease her episodes of sweating. She has taken just 6.25mg  this am

## 2010-12-04 NOTE — Progress Notes (Signed)
Amber [name] [id number] Sep 28, 1945 12/04/2010      Progress Note-Follow Up  Subjective  Chief Complaint  Chief Complaint  Patient presents with  . Follow-up    2 week follow up    HPI  Patient is a 65 year old Caucasian female in today for followup. She continues to have episodes of sweating frequently throughout the day. Saw her cardiologist yesterday afternoon stress testing included cardiac condition but he acknowledged her blood pressures been running lower and this may be contributing to the drop for carvedilol 6.25 mg twice a day. She took Advil this morning and her blood pressure is good her visit today. They also agreed with titrating down her Cymbalta to see if that is helpful. She reports her sugars have not been high. She does not she still under great deal of stress secondary to recent death of her twin brother. She cries easily and acknowledges this is affecting her hot flashes as well. No fevers, chills, chest pain, palpitations, shortness of breath, GI or GU complaints noted today's visit. They leave early next month for a long trip to [place] and do have multiple medications in order for the trip. Had increased her metformin to 1000 mg tabs 2 daily and she is tolerating this well.  Past Medical History  Diagnosis Date  . Thyroid disease   . Hyperlipidemia   . Hypertension     5 stents  . Arthritis   . Depression   . Diabetes mellitus     type 2  . Hypoxia 10/07/2010  . Skin lesion of back 10/18/2010  . Viral infection characterized by skin and mucous membrane lesions 10/18/2010  . Vitamin D deficiency 11/21/2010  . Back pain 11/21/2010  . Motion sickness 12/04/2010    Past Surgical History  Procedure Date  . Angioplasty 1980  . Angioplasty 2006    3 stents  . Angioplasty 2009    2 stents  . Cholecystectomy   . Thyroidectomy 1950    for goiter  . Abdominal hysterectomy 1977    partial  . Diverticulitis 1978    exploratory  . Right rotator cuff repair 2004    . Left rotator cuff repair 2004  . Bone fusion left foot 2010    7 screws in foot, bunionectomy  . Hip surgery     large bone spur removed, right  . Back surgery 2004    lumbar discectomy for bulging disc    Family History  Problem Relation Age of Onset  . Heart disease Father   . Diabetes Sister   . Hypertension Sister   . Depression Sister   . Cancer Sister     colon cancer s/p colectomy  . Emphysema Brother     smoker  . Diabetes Daughter   . Hypertension Daughter   . Depression Daughter   . Diverticulitis Daughter   . Other Daughter     Trichotrilomania  . Aneurysm Maternal Grandmother     brain aneurysm  . Diabetes Paternal Grandmother   . Hypertension Paternal Grandmother   . Cancer Paternal Grandmother     colon  . Crohn's disease Paternal Grandfather   . Emphysema Brother     smoker  . Heart disease Brother   . Diabetes Brother   . Cancer Brother     thyroid  . Coronary artery disease Brother     s/p MI's and 7 stents  . Diabetes Brother   . Other Brother     back disease w/ bulging discs  .  Colon polyps Brother     esophageal and tongue polyps  . Diabetes Sister   . Other Sister     short term memory loss  . Aneurysm Sister     brain aneursm s/p coiling  . Diabetes Son   . Anxiety disorder Son     History   Social History  . Marital Status: Married    Spouse Name: N/A    Number of Children: N/A  . Years of Education: N/A   Occupational History  . retired Geologist, engineering    Social History Main Topics  . Smoking status: Never Smoker   . Smokeless tobacco: Never Used  . Alcohol Use: No  . Drug Use: No  . Sexually Active: Yes -- Female partner(s)   Other Topics Concern  . Not on file   Social History Narrative  . No narrative on file    Current Outpatient Prescriptions on File Prior to Visit  Medication Sig Dispense Refill  . amLODipine-benazepril (LOTREL) 5-20 MG per capsule Take 1 capsule by mouth daily.  90 capsule  1  .  aspirin 81 MG tablet Take 81 mg by mouth 2 (two) times daily.       Marland Kitchen BREWERS YEAST PO Take by mouth daily.        . celecoxib (CELEBREX) 200 MG capsule Take 1 capsule (200 mg total) by mouth 2 (two) times daily.  60 capsule  0  . Cholecalciferol (VITAMIN D-3 PO) Take 2 tablets by mouth daily.       . citalopram (CELEXA) 40 MG tablet Take 1 tablet (40 mg total) by mouth daily.  90 tablet  1  . clopidogrel (PLAVIX) 75 MG tablet Take 1 tablet (75 mg total) by mouth daily.  90 tablet  1  . diazepam (VALIUM) 5 MG tablet 1/2 to 2 tabs po bid prn anxiety or insomnia  30 tablet  0  . gabapentin (NEURONTIN) 300 MG capsule Take 2 capsules (600 mg total) by mouth 2 (two) times daily.  360 capsule  1  . isosorbide mononitrate (IMDUR) 60 MG 24 hr tablet Take 1 tablet (60 mg total) by mouth daily.  90 tablet  1  . levothyroxine (SYNTHROID, LEVOTHROID) 25 MCG tablet Take 1 tablet (25 mcg total) by mouth daily.  90 tablet  1  . magnesium oxide (MAG-OX) 400 MG tablet Take 400 mg by mouth daily.        . metFORMIN (GLUCOPHAGE) 1000 MG tablet Take 1 tablet (1,000 mg total) by mouth 2 (two) times daily with a meal.  60 tablet  1  . NON FORMULARY Gaba 500 mg- bid       . NON FORMULARY Kava Kava 150 mg- bid       . NON FORMULARY Rhodeola Rosea 500 mg- daily       . pantoprazole (PROTONIX) 40 MG tablet Take 1 tablet (40 mg total) by mouth daily.  90 tablet  1  . Probiotic Product (PROBIOTIC FORMULA PO) Take by mouth.        . sucralfate (CARAFATE) 1 G tablet Take 1 g by mouth as needed.        . vitamin B-12 (CYANOCOBALAMIN) 1000 MCG tablet Take 1,000 mcg by mouth daily.        Marland Kitchen DISCONTD: carvedilol (COREG) 12.5 MG tablet Take 1 tablet (12.5 mg total) by mouth 2 (two) times daily.  180 tablet  1  . DISCONTD: DULoxetine (CYMBALTA) 60 MG capsule Take 1 capsule (60 mg  total) by mouth 2 (two) times daily.  180 capsule  1    Allergies  Allergen Reactions  . Percocet (Oxycodone-Acetaminophen) Itching    Review of  Systems  Review of Systems  Constitutional: Negative for fever and malaise/fatigue.  HENT: Negative for congestion.   Eyes: Negative for discharge.  Respiratory: Negative for shortness of breath.   Cardiovascular: Negative for chest pain, palpitations and leg swelling.  Gastrointestinal: Negative for nausea, abdominal pain and diarrhea.  Genitourinary: Negative for dysuria.  Musculoskeletal: Negative for falls.  Skin: Negative for rash.  Neurological: Negative for loss of consciousness and headaches.  Endo/Heme/Allergies: Negative for polydipsia.  Psychiatric/Behavioral: Positive for depression. Negative for suicidal ideas. The patient is nervous/anxious. The patient does not have insomnia.     Objective  BP 119/74  Pulse 87  Temp(Src) 97.7 F (36.5 C) (Oral)  Ht 5\' 1"  (1.549 m)  Wt 253 lb 12.8 oz (115.123 kg)  BMI 47.96 kg/m2  SpO2 89%  Physical Exam  Physical Exam  Constitutional: She is oriented to person, place, and time and well-developed, well-nourished, and in no distress. No distress.       obese  HENT:  Head: Normocephalic and atraumatic.  Eyes: Conjunctivae are normal.  Neck: Neck supple. No thyromegaly present.  Cardiovascular: Normal rate, regular rhythm and normal heart sounds.   No murmur heard. Pulmonary/Chest: Effort normal and breath sounds normal. She has no wheezes.  Abdominal: She exhibits no distension and no mass.  Musculoskeletal: She exhibits no edema.  Lymphadenopathy:    She has no cervical adenopathy.  Neurological: She is alert and oriented to person, place, and time.  Skin: Skin is warm and dry. No rash noted. She is not diaphoretic.  Psychiatric: Memory, affect and judgment normal.    Lab Results  Component Value Date   TSH 3.28 11/20/2010   Lab Results  Component Value Date   WBC 10.4 11/20/2010   HGB 13.8 11/20/2010   HCT 43.2 11/20/2010   MCV 98.0 11/20/2010   PLT 260 11/20/2010   Lab Results  Component Value Date   CREATININE  0.9 11/20/2010   BUN 26* 11/20/2010   NA 142 11/20/2010   K 4.7 11/20/2010   CL 102 11/20/2010   CO2 32 11/20/2010   Lab Results  Component Value Date   ALT 19 11/20/2010   AST 20 11/20/2010   ALKPHOS 96 11/20/2010   BILITOT 0.3 11/20/2010   Lab Results  Component Value Date   CHOL 161 11/20/2010   Lab Results  Component Value Date   HDL 52.00 11/20/2010   Lab Results  Component Value Date   LDLCALC 77 11/20/2010   Lab Results  Component Value Date   TRIG 162.0* 11/20/2010   Lab Results  Component Value Date   CHOLHDL 3 11/20/2010     Assessment & Plan  ANXIETY DEPRESSION She is struggling with heavy grief over the loss of her twin brother recently. She is sad but handling it. We are going to try and decrease Cymbalta 60mg  in am and 30mg  q pm.  And then titrate off the 30mg  dose after a month as tolerated  ESSENTIAL HYPERTENSION, BENIGN Well controlled on current doses, no changes  CAD Saw cardiology yesterday and after testing they have concluded that her stents are patent. They have dropped her Carvedilol to 6.25mg  bid hoping to decrease her episodes of sweating. She has taken just 6.25mg  this am  Motion sickness Is traveling to [place] soon and has used Scopolamine  patches in the past so she is given an rx to use for her travels

## 2010-12-04 NOTE — Assessment & Plan Note (Signed)
Is traveling to [place] soon and has used Scopolamine patches in the past so she is given an rx to use for her travels

## 2010-12-09 ENCOUNTER — Telehealth: Payer: Self-pay | Admitting: Family Medicine

## 2010-12-09 MED ORDER — ISOSORBIDE MONONITRATE ER 60 MG PO TB24: 60.0000 mg | ORAL_TABLET | Freq: Every day | ORAL | Status: DC

## 2010-12-09 NOTE — Telephone Encounter (Signed)
Patient is requesting samples of isosorbide NER & Protonix to last for 2 weeks

## 2010-12-09 NOTE — Telephone Encounter (Signed)
Pt informed that RX is ready to pick up and can pick up Dexilant

## 2010-12-10 ENCOUNTER — Other Ambulatory Visit: Payer: Self-pay

## 2010-12-10 ENCOUNTER — Encounter: Payer: Self-pay | Admitting: Family Medicine

## 2010-12-10 ENCOUNTER — Ambulatory Visit (INDEPENDENT_AMBULATORY_CARE_PROVIDER_SITE_OTHER): Payer: Medicare Other | Admitting: Family Medicine

## 2010-12-10 VITALS — BP 130/75 | HR 104 | Temp 98.2°F | Ht 61.0 in | Wt 238.4 lb

## 2010-12-10 DIAGNOSIS — M549 Dorsalgia, unspecified: Secondary | ICD-10-CM

## 2010-12-10 DIAGNOSIS — I10 Essential (primary) hypertension: Secondary | ICD-10-CM

## 2010-12-10 MED ORDER — CLOPIDOGREL BISULFATE 75 MG PO TABS: 75.0000 mg | ORAL_TABLET | Freq: Every day | ORAL | Status: DC

## 2010-12-10 MED ORDER — BACLOFEN 10 MG PO TABS: ORAL_TABLET | ORAL | Status: DC

## 2010-12-10 NOTE — Assessment & Plan Note (Signed)
Patient has been struggling with right-sided upper back pain for about a week now. The pain comes and goes and has gone worse over the last 2 days. It is worse when she reaches forward with her right arm. She can see in the right position make the pain resolves. She has point tenderness over the lower trapezius muscle. We will place her on Lidoderm patches 1 daily she has some home move after 12 hours. She's given baclofen 10 mg 1-2 tabs twice a day as tolerated and may continue with Celebrex use as well maintaining activity level as tolerated and notify us if symptoms do not improve.

## 2010-12-10 NOTE — Assessment & Plan Note (Signed)
Adequate control despite hi pain level no change to therapy today

## 2010-12-10 NOTE — Progress Notes (Signed)
Amber [name] [id number] 1946/02/19 12/10/2010      Progress Note-Follow Up  Subjective  Chief Complaint  Chief Complaint  Patient presents with  . Back Pain    sharp pain in back    HPI  Patient is a 65 year old Caucasian female who is in today with a one to one and half week history of worsening right posterior shoulder pain. She denies any falls or trauma. Notes she can get a comfortable position when she sitting. With any extensive reaching especially for with her right arm she has a sharp pain in her right shoulder blade. She denies fevers, chills, chest pain, palpitations, shortness of breath, GI or GU complaints. They have tried some topical treatments without any effect. She does take Celebrex and it provides minimal relief. No radicular symptoms. No hematuria, dysuria or urinary concerns.  Past Medical History  Diagnosis Date  . Thyroid disease   . Hyperlipidemia   . Hypertension     5 stents  . Arthritis   . Depression   . Diabetes mellitus     type 2  . Hypoxia 10/07/2010  . Skin lesion of back 10/18/2010  . Viral infection characterized by skin and mucous membrane lesions 10/18/2010  . Vitamin D deficiency 11/21/2010  . Back pain 11/21/2010  . Motion sickness 12/04/2010    Past Surgical History  Procedure Date  . Angioplasty 1980  . Angioplasty 2006    3 stents  . Angioplasty 2009    2 stents  . Cholecystectomy   . Thyroidectomy 1950    for goiter  . Abdominal hysterectomy 1977    partial  . Diverticulitis 1978    exploratory  . Right rotator cuff repair 2004  . Left rotator cuff repair 2004  . Bone fusion left foot 2010    7 screws in foot, bunionectomy  . Hip surgery     large bone spur removed, right  . Back surgery 2004    lumbar discectomy for bulging disc    Family History  Problem Relation Age of Onset  . Heart disease Father   . Diabetes Sister   . Hypertension Sister   . Depression Sister   . Cancer Sister     colon cancer s/p  colectomy  . Emphysema Brother     smoker  . Diabetes Daughter   . Hypertension Daughter   . Depression Daughter   . Diverticulitis Daughter   . Other Daughter     Trichotrilomania  . Aneurysm Maternal Grandmother     brain aneurysm  . Diabetes Paternal Grandmother   . Hypertension Paternal Grandmother   . Cancer Paternal Grandmother     colon  . Crohn's disease Paternal Grandfather   . Emphysema Brother     smoker  . Heart disease Brother   . Diabetes Brother   . Cancer Brother     thyroid  . Coronary artery disease Brother     s/p MI's and 7 stents  . Diabetes Brother   . Other Brother     back disease w/ bulging discs  . Colon polyps Brother     esophageal and tongue polyps  . Diabetes Sister   . Other Sister     short term memory loss  . Aneurysm Sister     brain aneursm s/p coiling  . Diabetes Son   . Anxiety disorder Son     History   Social History  . Marital Status: Married    Spouse Name: N/A  Number of Children: N/A  . Years of Education: N/A   Occupational History  . retired Geologist, engineering    Social History Main Topics  . Smoking status: Never Smoker   . Smokeless tobacco: Never Used  . Alcohol Use: No  . Drug Use: No  . Sexually Active: Yes -- Female partner(s)   Other Topics Concern  . Not on file   Social History Narrative  . No narrative on file    Current Outpatient Prescriptions on File Prior to Visit  Medication Sig Dispense Refill  . amLODipine-benazepril (LOTREL) 5-20 MG per capsule Take 1 capsule by mouth daily.  90 capsule  1  . aspirin 81 MG tablet Take 81 mg by mouth 2 (two) times daily.       Marland Kitchen BREWERS YEAST PO Take by mouth daily.        . carvedilol (COREG) 12.5 MG tablet Take 6.25 mg by mouth 2 (two) times daily.        . celecoxib (CELEBREX) 200 MG capsule Take 1 capsule (200 mg total) by mouth 2 (two) times daily.  60 capsule  0  . Cholecalciferol (VITAMIN D) 2000 UNITS tablet Take 2,000 Units by mouth daily.         . Cholecalciferol (VITAMIN D-3 PO) Take 2 tablets by mouth daily.       . citalopram (CELEXA) 40 MG tablet Take 1 tablet (40 mg total) by mouth daily.  90 tablet  1  . clopidogrel (PLAVIX) 75 MG tablet Take 1 tablet (75 mg total) by mouth daily.  90 tablet  1  . diazepam (VALIUM) 5 MG tablet 1/2 to 2 tabs po bid prn anxiety or insomnia  30 tablet  0  . DULoxetine (CYMBALTA) 30 MG capsule 1 tab po once daily and a 60 mg tabs once daily as well  30 capsule  2  . DULoxetine (CYMBALTA) 60 MG capsule Take 1 capsule (60 mg total) by mouth 2 (two) times daily.  180 capsule  1  . ezetimibe-simvastatin (VYTORIN) 10-80 MG per tablet Take 1 tablet by mouth at bedtime.        . gabapentin (NEURONTIN) 300 MG capsule Take 2 capsules (600 mg total) by mouth 2 (two) times daily.  360 capsule  1  . isosorbide mononitrate (IMDUR) 60 MG 24 hr tablet Take 1 tablet (60 mg total) by mouth daily.  30 tablet  1  . levothyroxine (SYNTHROID, LEVOTHROID) 25 MCG tablet Take 1 tablet (25 mcg total) by mouth daily.  90 tablet  1  . magnesium oxide (MAG-OX) 400 MG tablet Take 400 mg by mouth daily.        . metFORMIN (GLUCOPHAGE) 1000 MG tablet Take 1 tablet (1,000 mg total) by mouth 2 (two) times daily with a meal.  60 tablet  1  . NON FORMULARY Gaba 500 mg- bid       . NON FORMULARY Kava Kava 150 mg- bid       . NON FORMULARY [place] Rosea 500 mg- daily       . pantoprazole (PROTONIX) 40 MG tablet Take 1 tablet (40 mg total) by mouth daily.  90 tablet  1  . Probiotic Product (PROBIOTIC FORMULA PO) Take by mouth.        Marland Kitchen scopolamine (TRANSDERM-SCOP) 1.5 MG Place 1 patch (1.5 mg total) onto the skin every 3 (three) days.  4 patch  0  . sucralfate (CARAFATE) 1 G tablet Take 1 g by mouth as needed.        Marland Kitchen  vitamin B-12 (CYANOCOBALAMIN) 1000 MCG tablet Take 1,000 mcg by mouth daily.          Allergies  Allergen Reactions  . Percocet (Oxycodone-Acetaminophen) Itching    Review of Systems  Review of Systems    Constitutional: Negative for fever and malaise/fatigue.  HENT: Negative for congestion.   Eyes: Negative for discharge.  Respiratory: Negative for shortness of breath.   Cardiovascular: Negative for chest pain, palpitations and leg swelling.  Gastrointestinal: Negative for nausea, abdominal pain and diarrhea.  Genitourinary: Negative for dysuria.  Musculoskeletal: Positive for back pain. Negative for falls.       Right upper back pain just over lower shoulder blade, pain for 1 to 1 1/2 weeks, worse over past 2 days, can get comfortable when sitting but with extension forward of right arm has sharp pain. No falls or radicular symptoms  Skin: Negative for rash.  Neurological: Negative for loss of consciousness and headaches.  Endo/Heme/Allergies: Negative for polydipsia.  Psychiatric/Behavioral: Negative for depression and suicidal ideas. The patient is not nervous/anxious and does not have insomnia.     Objective  BP 130/75  Pulse 104  Temp(Src) 98.2 F (36.8 C) (Oral)  Ht 5\' 1"  (1.549 m)  Wt 238 lb 6.4 oz (108.138 kg)  BMI 45.05 kg/m2  SpO2 90%  Physical Exam  Physical Exam  Constitutional: She is oriented to person, place, and time and well-developed, well-nourished, and in no distress. No distress.  HENT:  Head: Normocephalic and atraumatic.  Eyes: Conjunctivae are normal.  Neck: Neck supple. No thyromegaly present.  Cardiovascular: Normal rate, regular rhythm and normal heart sounds.   Pulmonary/Chest: Effort normal and breath sounds normal. She has no wheezes.  Abdominal: She exhibits no distension and no mass.  Musculoskeletal: She exhibits tenderness. She exhibits no edema.       Spasm noted with tenderness over muscle above lower right shoulder blade  Lymphadenopathy:    She has no cervical adenopathy.  Neurological: She is alert and oriented to person, place, and time.  Skin: Skin is warm and dry. No rash noted. She is not diaphoretic.  Psychiatric: Memory, affect  and judgment normal.    Lab Results  Component Value Date   TSH 3.28 11/20/2010   Lab Results  Component Value Date   WBC 10.4 11/20/2010   HGB 13.8 11/20/2010   HCT 43.2 11/20/2010   MCV 98.0 11/20/2010   PLT 260 11/20/2010   Lab Results  Component Value Date   CREATININE 0.9 11/20/2010   BUN 26* 11/20/2010   NA 142 11/20/2010   K 4.7 11/20/2010   CL 102 11/20/2010   CO2 32 11/20/2010   Lab Results  Component Value Date   ALT 19 11/20/2010   AST 20 11/20/2010   ALKPHOS 96 11/20/2010   BILITOT 0.3 11/20/2010   Lab Results  Component Value Date   CHOL 161 11/20/2010   Lab Results  Component Value Date   HDL 52.00 11/20/2010   Lab Results  Component Value Date   LDLCALC 77 11/20/2010   Lab Results  Component Value Date   TRIG 162.0* 11/20/2010   Lab Results  Component Value Date   CHOLHDL 3 11/20/2010     Assessment & Plan  Back pain Patient has been struggling with right-sided upper back pain for about a week now. The pain comes and goes and has gone worse over the last 2 days. It is worse when she reaches forward with her right arm. She can see  in the right position make the pain resolves. She has point tenderness over the lower trapezius muscle. We will place her on Lidoderm patches 1 daily she has some home move after 12 hours. She's given baclofen 10 mg 1-2 tabs twice a day as tolerated and may continue with Celebrex use as well maintaining activity level as tolerated and notify us if symptoms do not improve.  ESSENTIAL HYPERTENSION, BENIGN Adequate control despite hi pain level no change to therapy today

## 2010-12-10 NOTE — Patient Instructions (Signed)
Back Pain & Injury Your back pain is most likely caused by a strain of the muscles or ligaments supporting the spine. Back strains cause pain and trouble moving because of muscle spasms. They may take several weeks to heal. Usually they are better in days.  Treatment for back pain includes:  Rest - Get bed rest as needed over the next day or two. Use a firm mattress and lie on your side with your knees slightly bent. If you lie on your back, put a pillow under your knees.   Early movement - Back pain improves most rapidly if you remain active. It is much more stressful on the back to sit or stand in one place. Do not sit, drive or stand in one place for more than 30 minutes at a time. Take short walks on level surfaces as soon as pain allows.   Limit bending and lifting - Do not bend over or lift anything over 20 pounds until instructed otherwise. Lift by bending your knees. Use your leg muscles to help. Keep the load close to your body and avoid twisting. Do not reach or do overhead work.   Medicines - Medicine to reduce pain and inflammation are helpful. Muscle-relaxing drugs may be prescribed.   Therapy - Put ice packs on your back every few hours for the first 2-3 days after your injury or as instructed. After that ice or heat may be alternated to reduce pain and spasm. Back exercises and gentle massage may be of some benefit. You should be examined again if your back pain is not better in one week.  SEEK IMMEDIATE MEDICAL CARE IF:  You have pain that radiates from your back into your legs.   You develop new bowel or bladder control problems.   You have unusual weakness or numbness in your arms or legs.   You develop nausea or vomiting.   You develop abdominal pain.   You feel faint.  Document Released: 04/21/2005 Document Re-Released: 01/29/2008 [facility] Patient Information 2011 [place], [place].  Try placing one of her Lidoderm patches over the spasmed muscle daily,

## 2010-12-16 ENCOUNTER — Other Ambulatory Visit: Payer: Self-pay | Admitting: Family Medicine

## 2010-12-16 MED ORDER — ISOSORBIDE MONONITRATE ER 60 MG PO TB24: 60.0000 mg | ORAL_TABLET | Freq: Every day | ORAL | Status: DC

## 2010-12-16 NOTE — Telephone Encounter (Signed)
Patient wants prescription sent to [facility].

## 2010-12-16 NOTE — Telephone Encounter (Signed)
Is this due? If so OK to fill in 30 day or 90 day supply per patient preference. With refills to last 6 months in total

## 2010-12-16 NOTE — Telephone Encounter (Signed)
RX sent. Pt aware. 

## 2010-12-17 ENCOUNTER — Telehealth: Payer: Self-pay | Admitting: Pulmonary Disease

## 2010-12-17 DIAGNOSIS — R0902 Hypoxemia: Secondary | ICD-10-CM

## 2010-12-17 NOTE — Telephone Encounter (Signed)
Called spoke with patient's spouse who reports he has already spoken with pt's PCP (Dr Abner Greenspan who accepted pt after Dr Larina Bras moved to [facility] > this physician is who ordered the o2) and Dr Abner Greenspan rec'd that pt turn to [facility] for the order to requalify for qhs o2.  Pt's spouse understands that [facility] does not follow pt for her sleep apnea and pt "does not have an ongoing pulmonary issue that is being followed in the office."  When I rec'd pt's spouse contact PCP about KC's recs, he requested that we do this so that he is "not caught in the middle."  Dr Shelle Iron, since Dr Abner Greenspan is in epic, may this message be routed to her/her nurse or would you like to order the ONO ?  Thanks.

## 2010-12-17 NOTE — Telephone Encounter (Signed)
I do not follow her for her sleep apnea, and she does not have an ongoing pulmonary issue that is being followed in the office.  She really needs to get this done thru whoever originally ordered her oxygen.  ?primary md?

## 2010-12-17 NOTE — Telephone Encounter (Signed)
Lets go ahead and order ONO on room air to get the patient taken care of. Will send this to Dr. Rogelia Rohrer as well to review message string.

## 2010-12-17 NOTE — Telephone Encounter (Signed)
Order sent. Pt aware. Also sent to Dr. Rogelia Rohrer. Carron Curie, CMA

## 2010-12-17 NOTE — Telephone Encounter (Signed)
Called, spoke with pt.  States she uses o2 qhs with cpap.  States the o2 needs to be recertified - uses [facility].  Dr. Shelle Iron, are you ok with ordering ONO for the o2 recert?  Pls advise. Thanks!

## 2010-12-18 NOTE — Telephone Encounter (Signed)
Can we contact [facility] and make sure they have [place] in their system as new PMD and just follow this up so that next year this comes directly to [place] instead of being routed through the patient. Then we can automatically renew.

## 2010-12-18 NOTE — Telephone Encounter (Signed)
Duplicate PN in error.

## 2010-12-25 ENCOUNTER — Telehealth: Payer: Self-pay | Admitting: Pulmonary Disease

## 2010-12-25 NOTE — Telephone Encounter (Signed)
Please let pt know that low saturation overnight without oxygen was 80%. She spent during the night less than or equal to 88% This re-qualifies her for nighttime oxygen.

## 2010-12-27 ENCOUNTER — Encounter: Payer: Self-pay | Admitting: Family Medicine

## 2010-12-27 ENCOUNTER — Other Ambulatory Visit: Payer: Self-pay | Admitting: Family Medicine

## 2010-12-27 ENCOUNTER — Ambulatory Visit (INDEPENDENT_AMBULATORY_CARE_PROVIDER_SITE_OTHER): Payer: Medicare Other | Admitting: Family Medicine

## 2010-12-27 DIAGNOSIS — M549 Dorsalgia, unspecified: Secondary | ICD-10-CM

## 2010-12-27 DIAGNOSIS — E039 Hypothyroidism, unspecified: Secondary | ICD-10-CM

## 2010-12-27 DIAGNOSIS — M545 Low back pain, unspecified: Secondary | ICD-10-CM

## 2010-12-27 DIAGNOSIS — R5381 Other malaise: Secondary | ICD-10-CM

## 2010-12-27 DIAGNOSIS — I10 Essential (primary) hypertension: Secondary | ICD-10-CM

## 2010-12-27 DIAGNOSIS — F341 Dysthymic disorder: Secondary | ICD-10-CM

## 2010-12-27 DIAGNOSIS — R10A1 Flank pain, right side: Secondary | ICD-10-CM

## 2010-12-27 DIAGNOSIS — R5383 Other fatigue: Secondary | ICD-10-CM

## 2010-12-27 DIAGNOSIS — R109 Unspecified abdominal pain: Secondary | ICD-10-CM

## 2010-12-27 DIAGNOSIS — G4730 Sleep apnea, unspecified: Secondary | ICD-10-CM

## 2010-12-27 LAB — POCT URINALYSIS DIPSTICK
Protein, UA: NEGATIVE
Urobilinogen, UA: 1
pH, UA: 5.5

## 2010-12-27 NOTE — Telephone Encounter (Signed)
9 weeks without her oxygen may be a little too long Her dme here can arrange for a company in [place] to deliver a concentrator to where they are staying until they leave. They need to discuss with [place].

## 2010-12-27 NOTE — Assessment & Plan Note (Signed)
Due to her sudden increase in fatigue and her looming trip overseas we will recheck her TSH level and reassess once results are ready

## 2010-12-27 NOTE — Assessment & Plan Note (Signed)
Have decreased her Cymbalta to 60mg  just once a day and she is definitely very sad over the loss of her brother this is contributing to her fatigue.

## 2010-12-27 NOTE — Progress Notes (Signed)
Amber [name] [id number] 06-Aug-1945 12/27/2010      Progress Note-Follow Up  Subjective  Chief Complaint  Chief Complaint  Patient presents with  . Fatigue    groggy all the time  . Nausea    upset stomach    HPI  Patient is a 65 year old Caucasian female in today accompanied by her husband with concerns of excessive and worsening fatigue. She is also struggling with right flank and right back pain as well. The right sided pain is constant but with certain movements and twisting the pain becomes sharper. He tried baclofen but that made her too groggy so he stopped it. He did get some mildly paternal and old thalamic her too groggy. They have not tried a Lidoderm patches recently. R. taking the Celebrex. He denies any new urinary symptoms such as dysuria or hematuria. He denies any fevers, chills, chest pain, palpitations, shortness of breath, congestion. She is having some frequent loose stool but it is not excessive and multiple times a day. No bloody or tarry stool. Her appetite is decreased but present. She recently had a sleep oxygen test is awaiting results. Denies feeling dyspneic or struggling with any paroxysmal nocturnal dyspnea. Past Medical History  Diagnosis Date  . Thyroid disease   . Hyperlipidemia   . Hypertension     5 stents  . Arthritis   . Depression   . Diabetes mellitus     type 2  . Hypoxia 10/07/2010  . Skin lesion of back 10/18/2010  . Viral infection characterized by skin and mucous membrane lesions 10/18/2010  . Vitamin D deficiency 11/21/2010  . Back pain 11/21/2010  . Motion sickness 12/04/2010    Past Surgical History  Procedure Date  . Angioplasty 1980  . Angioplasty 2006    3 stents  . Angioplasty 2009    2 stents  . Cholecystectomy   . Thyroidectomy 1950    for goiter  . Abdominal hysterectomy 1977    partial  . Diverticulitis 1978    exploratory  . Right rotator cuff repair 2004  . Left rotator cuff repair 2004  . Bone fusion left  foot 2010    7 screws in foot, bunionectomy  . Hip surgery     large bone spur removed, right  . Back surgery 2004    lumbar discectomy for bulging disc    Family History  Problem Relation Age of Onset  . Heart disease Father   . Diabetes Sister   . Hypertension Sister   . Depression Sister   . Cancer Sister     colon cancer s/p colectomy  . Emphysema Brother     smoker  . Diabetes Daughter   . Hypertension Daughter   . Depression Daughter   . Diverticulitis Daughter   . Other Daughter     Trichotrilomania  . Aneurysm Maternal Grandmother     brain aneurysm  . Diabetes Paternal Grandmother   . Hypertension Paternal Grandmother   . Cancer Paternal Grandmother     colon  . Crohn's disease Paternal Grandfather   . Emphysema Brother     smoker  . Heart disease Brother   . Diabetes Brother   . Cancer Brother     thyroid  . Coronary artery disease Brother     s/p MI's and 7 stents  . Diabetes Brother   . Other Brother     back disease w/ bulging discs  . Colon polyps Brother     esophageal and tongue polyps  .  Diabetes Sister   . Other Sister     short term memory loss  . Aneurysm Sister     brain aneursm s/p coiling  . Diabetes Son   . Anxiety disorder Son     History   Social History  . Marital Status: Married    Spouse Name: N/A    Number of Children: N/A  . Years of Education: N/A   Occupational History  . retired Geologist, engineering    Social History Main Topics  . Smoking status: Never Smoker   . Smokeless tobacco: Never Used  . Alcohol Use: No  . Drug Use: No  . Sexually Active: Yes -- Female partner(s)   Other Topics Concern  . Not on file   Social History Narrative  . No narrative on file    Current Outpatient Prescriptions on File Prior to Visit  Medication Sig Dispense Refill  . amLODipine-benazepril (LOTREL) 5-20 MG per capsule Take 1 capsule by mouth daily.  90 capsule  1  . aspirin 81 MG tablet Take 81 mg by mouth 2 (two) times  daily.       . baclofen (LIORESAL) 10 MG tablet 1-2 tabs po bid prn back pain  120 tablet  1  . BREWERS YEAST PO Take by mouth daily.        . carvedilol (COREG) 12.5 MG tablet Take 6.25 mg by mouth 2 (two) times daily.        . celecoxib (CELEBREX) 200 MG capsule Take 1 capsule (200 mg total) by mouth 2 (two) times daily.  60 capsule  0  . Cholecalciferol (VITAMIN D) 2000 UNITS tablet Take 2,000 Units by mouth daily.        . Cholecalciferol (VITAMIN D-3 PO) Take 2 tablets by mouth daily.       . citalopram (CELEXA) 40 MG tablet Take 1 tablet (40 mg total) by mouth daily.  90 tablet  1  . clopidogrel (PLAVIX) 75 MG tablet Take 1 tablet (75 mg total) by mouth daily.  90 tablet  2  . diazepam (VALIUM) 5 MG tablet 1/2 to 2 tabs po bid prn anxiety or insomnia  30 tablet  0  . DULoxetine (CYMBALTA) 60 MG capsule Take 1 capsule (60 mg total) by mouth 2 (two) times daily.  180 capsule  1  . ezetimibe-simvastatin (VYTORIN) 10-80 MG per tablet Take 1 tablet by mouth at bedtime.        . gabapentin (NEURONTIN) 300 MG capsule Take 2 capsules (600 mg total) by mouth 2 (two) times daily.  360 capsule  1  . isosorbide mononitrate (IMDUR) 60 MG 24 hr tablet Take 1 tablet (60 mg total) by mouth daily.  30 tablet  5  . levothyroxine (SYNTHROID, LEVOTHROID) 25 MCG tablet Take 1 tablet (25 mcg total) by mouth daily.  90 tablet  1  . magnesium oxide (MAG-OX) 400 MG tablet Take 400 mg by mouth daily.        . metFORMIN (GLUCOPHAGE) 1000 MG tablet Take 1 tablet (1,000 mg total) by mouth 2 (two) times daily with a meal.  60 tablet  1  . NON FORMULARY Gaba 500 mg- bid       . NON FORMULARY Kava Kava 150 mg- bid       . NON FORMULARY [place] Rosea 500 mg- daily       . pantoprazole (PROTONIX) 40 MG tablet Take 1 tablet (40 mg total) by mouth daily.  90 tablet  1  .  Probiotic Product (PROBIOTIC FORMULA PO) Take by mouth.        Marland Kitchen scopolamine (TRANSDERM-SCOP) 1.5 MG Place 1 patch (1.5 mg total) onto the skin every 3  (three) days.  4 patch  0  . sucralfate (CARAFATE) 1 G tablet Take 1 g by mouth as needed.        . vitamin B-12 (CYANOCOBALAMIN) 1000 MCG tablet Take 1,000 mcg by mouth daily.        Marland Kitchen DISCONTD: DULoxetine (CYMBALTA) 30 MG capsule 1 tab po once daily and a 60 mg tabs once daily as well  30 capsule  2    Allergies  Allergen Reactions  . Percocet (Oxycodone-Acetaminophen) Itching    Review of Systems  Review of Systems  Constitutional: Positive for malaise/fatigue. Negative for fever.  HENT: Negative for congestion.   Eyes: Negative for discharge.  Respiratory: Negative for shortness of breath.   Cardiovascular: Negative for chest pain, palpitations and leg swelling.  Gastrointestinal: Negative for nausea, abdominal pain and diarrhea.  Genitourinary: Positive for flank pain. Negative for dysuria, frequency and hematuria.       Right, constant pain then with certain position changes especially twisting pain gets sharper  Musculoskeletal: Positive for back pain. Negative for falls.       Right side back pain in flank and over posterior hip, worse with position changes. Tramadol gives some relief  Skin: Negative for rash.  Neurological: Negative for loss of consciousness and headaches.  Endo/Heme/Allergies: Negative for polydipsia.  Psychiatric/Behavioral: Positive for depression. Negative for suicidal ideas, hallucinations and substance abuse. The patient is not nervous/anxious and does not have insomnia.        Patient very sad over the loss of her twin brother recently. Sleeping more and feeling very tired, having trouble concentrating. Likely multifactorial, We did recently decrease her Cymbalta to once daily and she has noted a decrease in her hot flashes to one every couple of days as opposed to 3-4 daily    Objective  BP 126/81  Pulse 90  Temp(Src) 97.5 F (36.4 C) (Oral)  Ht 5\' 1"  (1.549 m)  Wt 241 lb (109.317 kg)  BMI 45.54 kg/m2  SpO2 89%  Physical Exam  Physical  Exam  Constitutional: She is oriented to person, place, and time and well-developed, well-nourished, and in no distress. No distress.       obese  HENT:  Head: Normocephalic and atraumatic.  Eyes: Conjunctivae are normal.  Neck: Neck supple. No thyromegaly present.  Cardiovascular: Normal rate and regular rhythm.   Murmur heard.      1/6 systolic murmur  Pulmonary/Chest: Effort normal and breath sounds normal. She has no wheezes.  Abdominal: She exhibits no distension and no mass.  Musculoskeletal: She exhibits no edema.  Lymphadenopathy:    She has no cervical adenopathy.  Neurological: She is alert and oriented to person, place, and time.  Skin: Skin is warm and dry. No rash noted. She is not diaphoretic.  Psychiatric: Memory and judgment normal.       Depressed affect    Lab Results  Component Value Date   TSH 3.28 11/20/2010   Lab Results  Component Value Date   WBC 10.4 11/20/2010   HGB 13.8 11/20/2010   HCT 43.2 11/20/2010   MCV 98.0 11/20/2010   PLT 260 11/20/2010   Lab Results  Component Value Date   CREATININE 0.9 11/20/2010   BUN 26* 11/20/2010   NA 142 11/20/2010   K 4.7 11/20/2010  CL 102 11/20/2010   CO2 32 11/20/2010   Lab Results  Component Value Date   ALT 19 11/20/2010   AST 20 11/20/2010   ALKPHOS 96 11/20/2010   BILITOT 0.3 11/20/2010   Lab Results  Component Value Date   CHOL 161 11/20/2010   Lab Results  Component Value Date   HDL 52.00 11/20/2010   Lab Results  Component Value Date   LDLCALC 77 11/20/2010   Lab Results  Component Value Date   TRIG 162.0* 11/20/2010   Lab Results  Component Value Date   CHOLHDL 3 11/20/2010     Assessment & Plan  ESSENTIAL HYPERTENSION, BENIGN Adequately controlled today no changes to meds  UNSPECIFIED HYPOTHYROIDISM Due to her sudden increase in fatigue and her looming trip overseas we will recheck her TSH level and reassess once results are ready  SLEEP APNEA Just had her O2 level assessed qhs she  believes by [facility]. We have agreed that if she does not need pulmonary over this next year we will take over annual certification of her nocturnal O2. To that end we will try and obtain her study results and help recertify her if she does not hear anything by early next week  Back pain Really right flank pain and some pain over right posterior hip, encouraged her to use her Lidoderm patches and to stop Baclofen due to it causing some Sedation, may use Tramadol sparingly and cont Celebrex prn. Will check a UA and culture and xrays of her chest and lumbar spine.  ANXIETY DEPRESSION Have decreased her Cymbalta to 60mg  just once a day and she is definitely very sad over the loss of her brother this is contributing to her fatigue.     Physical Exam  Constitutional: She is oriented to person, place, and time and well-developed, well-nourished, and in no distress. No distress.       obese  HENT:  Head: Normocephalic and atraumatic.  Eyes: Conjunctivae are normal.  Neck: Neck supple. No thyromegaly present.  Cardiovascular: Normal rate and regular rhythm.   Murmur heard.      1/6 systolic murmur  Pulmonary/Chest: Effort normal and breath sounds normal. She has no wheezes.  Abdominal: She exhibits no distension and no mass.  Musculoskeletal: She exhibits no edema.  Lymphadenopathy:    She has no cervical adenopathy.  Neurological: She is alert and oriented to person, place, and time.  Skin: Skin is warm and dry. No rash noted. She is not diaphoretic.  Psychiatric: Memory and judgment normal.       Depressed affect

## 2010-12-27 NOTE — Assessment & Plan Note (Signed)
Really right flank pain and some pain over right posterior hip, encouraged her to use her Lidoderm patches and to stop Baclofen due to it causing some Sedation, may use Tramadol sparingly and cont Celebrex prn. Will check a UA and culture and xrays of her chest and lumbar spine.

## 2010-12-27 NOTE — Assessment & Plan Note (Signed)
Just had her O2 level assessed qhs she believes by [facility]. We have agreed that if she does not need pulmonary over this next year we will take over annual certification of her nocturnal O2. To that end we will try and obtain her study results and help recertify her if she does not hear anything by early next week

## 2010-12-27 NOTE — Assessment & Plan Note (Signed)
Adequately controlled today no changes to meds

## 2010-12-27 NOTE — Patient Instructions (Signed)
Back Pain (Lumbosacral Strain) Back pain is one of the most common causes of pain. There are many causes of back pain. Most are not serious conditions.  CAUSES Your backbone (spinal column) is made up of 24 main vertebral bodies, the sacrum, and the coccyx. These are held together by muscles and tough, fibrous tissue (ligaments). Nerve roots pass through the openings between the vertebrae. A sudden move or injury to the back may cause injury to, or pressure on, these nerves. This may result in localized back pain or pain movement (radiation) into the buttocks, down the leg, and into the foot. Sharp, shooting pain from the buttock down the back of the leg (sciatica) is frequently associated with a ruptured (herniated) disc. Pain may be caused by muscle spasm alone. Your caregiver can often find the cause of your pain by the details of your symptoms and an exam. In some cases, you may need tests (such as X-rays). Your caregiver will work with you to decide if any tests are needed based on your specific exam. HOME CARE INSTRUCTIONS  Avoid an underactive lifestyle. Active exercise, as directed by your caregiver, is your greatest weapon against back pain.   Avoid hard physical activities (tennis, racquetball, water-skiing) if you are not in proper physical condition for it. This may aggravate and/or create problems.   If you have a back problem, avoid sports requiring sudden body movements. Swimming and walking are generally safer activities.   Maintain good posture.   Avoid becoming overweight (obese).   Use bed rest for only the most extreme, sudden (acute) episode. Your caregiver will help you determine how much bed rest is necessary.   For acute conditions, you may put ice on the injured area.   Put ice in a plastic bag.   Place a towel between your skin and the bag.   Leave the ice on for 15 minutes at a time, every 2 hours, or as needed. May alternate with heat  After you are improved  and more active, it may help to apply heat for 30 minutes before activities.  See your caregiver if you are having pain that lasts longer than expected. Your caregiver can advise appropriate exercises and/or therapy if needed. With conditioning, most back problems can be avoided. SEEK IMMEDIATE MEDICAL CARE IF:  You have numbness, tingling, weakness, or problems with the use of your arms or legs.   You experience severe back pain not relieved with medicines.   There is a change in bowel or bladder control.   You have increasing pain in any area of the body, including your belly (abdomen).   You notice shortness of breath, dizziness, or feel faint.   You feel sick to your stomach (nauseous), are throwing up (vomiting), or become sweaty.   You notice discoloration of your toes or legs, or your feet get very cold.   Your back pain is getting worse.   You have an oral temperature above 104, not controlled by medicine.  MAKE SURE YOU:   Understand these instructions.   Will watch your condition.   Will get help right away if you are not doing well or get worse.  Document Released: 01/29/2005 Document Re-Released: 07/16/2009 [facility] Patient Information 2011 [place], [place].

## 2010-12-27 NOTE — Telephone Encounter (Signed)
Called and spoke with pt's husband.  Husband aware of results and KC's recs.  Husband verbalized understanding and stated he would relay message to pt.  Husband did have a question regarding oxygen:  He states pt will be going to [place] in Sept x 9 weeks.  She will bring her cpap machine with her but he is wanting to know if pt needs to bring oxygen as well.  And if so, how do they go about doing this.  KC, please advise.  Thanks.

## 2010-12-28 LAB — CBC WITH DIFFERENTIAL/PLATELET
Hemoglobin: 12.9 g/dL (ref 12.0–15.0)
Lymphocytes Relative: 23 % (ref 12–46)
Lymphs Abs: 2.9 10*3/uL (ref 0.7–4.0)
MCH: 30.8 pg (ref 26.0–34.0)
Monocytes Relative: 7 % (ref 3–12)
Neutro Abs: 8.6 10*3/uL — ABNORMAL HIGH (ref 1.7–7.7)
Neutrophils Relative %: 68 % (ref 43–77)
Platelets: 279 10*3/uL (ref 150–400)
RBC: 4.19 MIL/uL (ref 3.87–5.11)
WBC: 12.6 10*3/uL — ABNORMAL HIGH (ref 4.0–10.5)

## 2010-12-28 LAB — BASIC METABOLIC PANEL
CO2: 32 mEq/L (ref 19–32)
Calcium: 9.6 mg/dL (ref 8.4–10.5)
Chloride: 100 mEq/L (ref 96–112)
Glucose, Bld: 142 mg/dL — ABNORMAL HIGH (ref 70–99)
Potassium: 4.5 mEq/L (ref 3.5–5.3)
Sodium: 141 mEq/L (ref 135–145)

## 2010-12-28 LAB — HEPATIC FUNCTION PANEL
ALT: 19 U/L (ref 0–35)
AST: 21 U/L (ref 0–37)
Albumin: 4.1 g/dL (ref 3.5–5.2)
Alkaline Phosphatase: 91 U/L (ref 39–117)
Indirect Bilirubin: 0.2 mg/dL (ref 0.0–0.9)
Total Protein: 6.6 g/dL (ref 6.0–8.3)

## 2010-12-29 LAB — URINE CULTURE

## 2010-12-30 ENCOUNTER — Telehealth: Payer: Self-pay | Admitting: Pulmonary Disease

## 2010-12-30 MED ORDER — AMOXICILLIN-POT CLAVULANATE 875-125 MG PO TABS: 1.0000 | ORAL_TABLET | Freq: Two times a day (BID) | ORAL | Status: DC

## 2010-12-30 NOTE — Telephone Encounter (Signed)
Hand written rx given to [facility] to sign.

## 2010-12-30 NOTE — Telephone Encounter (Signed)
PATIENT'S Amber [name] CALL BACK AT [phone]

## 2010-12-30 NOTE — Telephone Encounter (Signed)
i think she will be ok at rest, even on a long flight

## 2010-12-30 NOTE — Progress Notes (Signed)
Patient's spouse informed.

## 2010-12-30 NOTE — Telephone Encounter (Signed)
Hand written rx has been signed for portable oxygen concentrator 2 liters. Pt husband is going to pick this up. He is aware it is up fornt for pick up. Nothing further was needed

## 2010-12-30 NOTE — Telephone Encounter (Signed)
Spoke with pt's husband and he states he will call the DME company and see if thye can arrange to get her a concentrator in [place]. Pt husband also wants to know if Dr. Shelle Iron thinks if pt will need her oxygen during the plane ride. Pt husband states it is a 12 hour flight. He also states during the day w/o her oxygen she ranges from 88% to 94%. Please advise Dr. Shelle Iron. Thanks  Carver Fila, CMA

## 2010-12-30 NOTE — Telephone Encounter (Signed)
Pt's husband returning call from last week can be reached at [phone].Raylene Everts

## 2010-12-30 NOTE — Telephone Encounter (Signed)
Error.Amber [name] ° °

## 2010-12-30 NOTE — Telephone Encounter (Signed)
Spoke with pt and made him aware that pt should be okay at rest during flight. Husband verbalized understanding. Pt husband now states he spoke with pt's dme company and they do not have an affiliate place in [place] to supply pt with a Insurance underwriter. Pt husband is wanting know how he would go about getting her a concentrator in [place]. Please advise Dr. Shelle Iron. Thanks  'Carver Fila, [place]

## 2010-12-30 NOTE — Progress Notes (Signed)
Addended by: Court Joy on: 12/30/2010 08:31 AM   Modules accepted: Orders

## 2011-01-28 LAB — CARDIAC PANEL(CRET KIN+CKTOT+MB+TROPI)
CK, MB: 3.3
Relative Index: INVALID
Total CK: 84

## 2011-01-28 LAB — CBC
HCT: 35.1 — ABNORMAL LOW
Hemoglobin: 12
RBC: 3.81 — ABNORMAL LOW
RDW: 14.5
WBC: 9.4

## 2011-01-28 LAB — CK TOTAL AND CKMB (NOT AT ARMC)
CK, MB: 3.3
Relative Index: INVALID

## 2011-01-28 LAB — TROPONIN I: Troponin I: 0.1 — ABNORMAL HIGH

## 2011-01-28 LAB — BASIC METABOLIC PANEL
BUN: 12
Chloride: 103
Creatinine, Ser: 0.75
GFR calc Af Amer: >60
GFR calc non Af Amer: >60

## 2011-01-28 LAB — TSH: TSH: 7.355 — ABNORMAL HIGH

## 2011-02-07 LAB — URINALYSIS, ROUTINE W REFLEX MICROSCOPIC
Bilirubin Urine: NEGATIVE
Glucose, UA: NEGATIVE
Ketones, ur: NEGATIVE
pH: 5

## 2011-02-07 LAB — MAGNESIUM: Magnesium: 2

## 2011-02-07 LAB — CK TOTAL AND CKMB (NOT AT ARMC)
CK, MB: 4.4 — ABNORMAL HIGH
Relative Index: 3.3 — ABNORMAL HIGH
Relative Index: 3.9 — ABNORMAL HIGH
Total CK: 114
Total CK: 82

## 2011-02-07 LAB — CBC
HCT: 39.3
Hemoglobin: 13.4
MCHC: 34
MCHC: 34.1
RBC: 4.73
RDW: 14.5
WBC: 14.9 — ABNORMAL HIGH

## 2011-02-07 LAB — TROPONIN I
Troponin I: 0.01
Troponin I: 0.01

## 2011-02-07 LAB — COMPREHENSIVE METABOLIC PANEL
ALT: 20
Albumin: 3.7
Calcium: 9.4
Glucose, Bld: 160 — ABNORMAL HIGH
Sodium: 138
Total Protein: 6.4

## 2011-02-07 LAB — DIFFERENTIAL
Eosinophils Absolute: 0.1 — ABNORMAL LOW
Lymphs Abs: 2.9
Monocytes Relative: 5
Neutro Abs: 11.1 — ABNORMAL HIGH
Neutrophils Relative %: 74

## 2011-02-07 LAB — BASIC METABOLIC PANEL
BUN: 19
CO2: 32
Chloride: 104
Glucose, Bld: 168 — ABNORMAL HIGH
Potassium: 3.8

## 2011-02-07 LAB — LIPID PANEL
Cholesterol: 136
HDL: 39 — ABNORMAL LOW

## 2011-02-07 LAB — APTT: aPTT: 25

## 2011-02-07 LAB — PROTIME-INR: Prothrombin Time: 12.3

## 2011-02-18 ENCOUNTER — Telehealth: Payer: Self-pay

## 2011-02-18 NOTE — Telephone Encounter (Signed)
Received paperwork for Oxygen? MD needs to know if patient has results from an O2 study or does this still need to be done? I left a message for patient to return my call.

## 2011-02-18 NOTE — Telephone Encounter (Signed)
I left you a copy of the O2 study which just showed up today. The test last year was on 11/11/1. The form states she will need a repeat study prior to that date to continue her O2 rx. See if she liked that company and wants to use them again or someone else

## 2011-02-19 NOTE — Telephone Encounter (Signed)
I left her a message yesterday to return my call but I believe patient was out of the country for several months?

## 2011-03-05 NOTE — Telephone Encounter (Signed)
md had me fax information to [facility]

## 2011-03-18 ENCOUNTER — Ambulatory Visit: Payer: Medicare Other | Admitting: Family Medicine

## 2011-03-19 ENCOUNTER — Encounter: Payer: Self-pay | Admitting: Family Medicine

## 2011-03-19 ENCOUNTER — Ambulatory Visit (INDEPENDENT_AMBULATORY_CARE_PROVIDER_SITE_OTHER): Payer: Medicare Other | Admitting: Family Medicine

## 2011-03-19 VITALS — BP 92/56 | HR 84 | Temp 98.1°F | Ht 61.0 in | Wt 240.1 lb

## 2011-03-19 DIAGNOSIS — E119 Type 2 diabetes mellitus without complications: Secondary | ICD-10-CM

## 2011-03-19 DIAGNOSIS — I10 Essential (primary) hypertension: Secondary | ICD-10-CM

## 2011-03-19 DIAGNOSIS — Z79899 Other long term (current) drug therapy: Secondary | ICD-10-CM

## 2011-03-19 DIAGNOSIS — D649 Anemia, unspecified: Secondary | ICD-10-CM

## 2011-03-19 DIAGNOSIS — Z9981 Dependence on supplemental oxygen: Secondary | ICD-10-CM

## 2011-03-19 DIAGNOSIS — E785 Hyperlipidemia, unspecified: Secondary | ICD-10-CM

## 2011-03-19 DIAGNOSIS — Z23 Encounter for immunization: Secondary | ICD-10-CM

## 2011-03-19 DIAGNOSIS — F341 Dysthymic disorder: Secondary | ICD-10-CM

## 2011-03-19 LAB — HEPATIC FUNCTION PANEL
ALT: 21 U/L (ref 0–35)
AST: 23 U/L (ref 0–37)
Bilirubin, Direct: 0 mg/dL (ref 0.0–0.3)
Total Bilirubin: 0.4 mg/dL (ref 0.3–1.2)

## 2011-03-19 LAB — TSH: TSH: 1.2 u[IU]/mL (ref 0.35–5.50)

## 2011-03-19 LAB — LIPID PANEL
Cholesterol: 142 mg/dL (ref 0–200)
VLDL: 37.4 mg/dL (ref 0.0–40.0)

## 2011-03-19 LAB — RENAL FUNCTION PANEL
CO2: 30 mEq/L (ref 19–32)
Calcium: 9.2 mg/dL (ref 8.4–10.5)
GFR: 54.35 mL/min — ABNORMAL LOW (ref >60.00)
Potassium: 4.4 mEq/L (ref 3.5–5.1)
Sodium: 141 mEq/L (ref 135–145)

## 2011-03-19 LAB — HEMOGLOBIN A1C: Hgb A1c MFr Bld: 6.4 % (ref 4.6–6.5)

## 2011-03-19 NOTE — Patient Instructions (Signed)

## 2011-03-19 NOTE — Assessment & Plan Note (Signed)
Well controlled today, no change in meds

## 2011-03-19 NOTE — Assessment & Plan Note (Signed)
Doing much better, no change in meds

## 2011-03-19 NOTE — Assessment & Plan Note (Signed)
Repeat a lipid panel

## 2011-03-19 NOTE — Assessment & Plan Note (Signed)
Minimize simple carbs, continue current meds repeat [id number] today

## 2011-03-19 NOTE — Assessment & Plan Note (Signed)
Only using oxygen qhs with good results

## 2011-03-19 NOTE — Progress Notes (Signed)
Amber [name] [id number] Sep 04, 1945 03/19/2011      Progress Note-Follow Up  Subjective  Chief Complaint  Chief Complaint  Patient presents with  . Follow-up    3 month follow up  . Injections    flu vaccination    HPI   Patient is a 65 year old Caucasian female comes in today for followup of multiple medical problems. She is in good spirits and feels well today. The report for endurance is much better after recent trip to [place] with increased walking. She is now walking 2 miles a day. She has less trouble shortness of breath and fatigue. She is using oxygen only at night. She wakes up refreshed. She did have some mild trouble constipation during her travels but that has resolved. She's not taking any extra medicines at this time. Her mood is good. She's been trying to maintain a well-balanced diet. No recent illness, fevers, chills, chest pain, palpitations, shortness of breath, GI or GU complaints noted at today's visit. Physical Exam  Constitutional: She is oriented to person, place, and time and well-developed, well-nourished, and in no distress. No distress.       Obese, using cane to ambulate  HENT:  Head: Normocephalic and atraumatic.  Eyes: Conjunctivae are normal.  Neck: Neck supple. No thyromegaly present.  Cardiovascular: Normal rate, regular rhythm and normal heart sounds.   Pulmonary/Chest: Effort normal and breath sounds normal. She has no wheezes.  Abdominal: She exhibits no distension and no mass.  Musculoskeletal: She exhibits no edema.  Lymphadenopathy:    She has no cervical adenopathy.  Neurological: She is alert and oriented to person, place, and time.  Skin: Skin is warm and dry. No rash noted. She is not diaphoretic.  Psychiatric: Memory, affect and judgment normal.    Past Medical History  Diagnosis Date  . Thyroid disease   . Hyperlipidemia   . Hypertension     5 stents  . Arthritis   . Depression   . Diabetes mellitus     type 2  .  Hypoxia 10/07/2010  . Skin lesion of back 10/18/2010  . Viral infection characterized by skin and mucous membrane lesions 10/18/2010  . Vitamin D deficiency 11/21/2010  . Back pain 11/21/2010  . Motion sickness 12/04/2010    Past Surgical History  Procedure Date  . Angioplasty 1980  . Angioplasty 2006    3 stents  . Angioplasty 2009    2 stents  . Cholecystectomy   . Thyroidectomy 1950    for goiter  . Abdominal hysterectomy 1977    partial  . Diverticulitis 1978    exploratory  . Right rotator cuff repair 2004  . Left rotator cuff repair 2004  . Bone fusion left foot 2010    7 screws in foot, bunionectomy  . Hip surgery     large bone spur removed, right  . Back surgery 2004    lumbar discectomy for bulging disc    Family History  Problem Relation Age of Onset  . Heart disease Father   . Diabetes Sister   . Hypertension Sister   . Depression Sister   . Cancer Sister     colon cancer s/p colectomy  . Emphysema Brother     smoker  . Diabetes Daughter   . Hypertension Daughter   . Depression Daughter   . Diverticulitis Daughter   . Other Daughter     Trichotrilomania  . Aneurysm Maternal Grandmother     brain aneurysm  . Diabetes  Paternal Grandmother   . Hypertension Paternal Grandmother   . Cancer Paternal Grandmother     colon  . Crohn's disease Paternal Grandfather   . Emphysema Brother     smoker  . Heart disease Brother   . Diabetes Brother   . Cancer Brother     thyroid  . Coronary artery disease Brother     s/p MI's and 7 stents  . Diabetes Brother   . Other Brother     back disease w/ bulging discs  . Colon polyps Brother     esophageal and tongue polyps  . Diabetes Sister   . Other Sister     short term memory loss  . Aneurysm Sister     brain aneursm s/p coiling  . Diabetes Son   . Anxiety disorder Son     History   Social History  . Marital Status: Married    Spouse Name: N/A    Number of Children: N/A  . Years of Education: N/A    Occupational History  . retired Geologist, engineering    Social History Main Topics  . Smoking status: Never Smoker   . Smokeless tobacco: Never Used  . Alcohol Use: No  . Drug Use: No  . Sexually Active: Yes -- Female partner(s)   Other Topics Concern  . Not on file   Social History Narrative  . No narrative on file    Current Outpatient Prescriptions on File Prior to Visit  Medication Sig Dispense Refill  . amLODipine-benazepril (LOTREL) 5-20 MG per capsule Take 1 capsule by mouth daily.  90 capsule  1  . aspirin 81 MG tablet Take 81 mg by mouth 2 (two) times daily.       . carvedilol (COREG) 12.5 MG tablet Take 6.25 mg by mouth 2 (two) times daily.        . Cholecalciferol (VITAMIN D-3 PO) Take 2 tablets by mouth daily.       . citalopram (CELEXA) 40 MG tablet Take 1 tablet (40 mg total) by mouth daily.  90 tablet  1  . clopidogrel (PLAVIX) 75 MG tablet Take 1 tablet (75 mg total) by mouth daily.  90 tablet  2  . DULoxetine (CYMBALTA) 60 MG capsule Take 1 capsule (60 mg total) by mouth 2 (two) times daily.  180 capsule  1  . ezetimibe-simvastatin (VYTORIN) 10-80 MG per tablet Take 1 tablet by mouth at bedtime.        . gabapentin (NEURONTIN) 300 MG capsule Take 2 capsules (600 mg total) by mouth 2 (two) times daily.  360 capsule  1  . isosorbide mononitrate (IMDUR) 60 MG 24 hr tablet Take 1 tablet (60 mg total) by mouth daily.  30 tablet  5  . levothyroxine (SYNTHROID, LEVOTHROID) 25 MCG tablet Take 1 tablet (25 mcg total) by mouth daily.  90 tablet  1  . magnesium oxide (MAG-OX) 400 MG tablet Take 400 mg by mouth daily.        . metFORMIN (GLUCOPHAGE) 1000 MG tablet Take 1 tablet (1,000 mg total) by mouth 2 (two) times daily with a meal.  60 tablet  1  . pantoprazole (PROTONIX) 40 MG tablet Take 1 tablet (40 mg total) by mouth daily.  90 tablet  1  . Probiotic Product (PROBIOTIC FORMULA PO) Take by mouth.        . sucralfate (CARAFATE) 1 G tablet Take 1 g by mouth as needed.         Marland Kitchen  vitamin B-12 (CYANOCOBALAMIN) 1000 MCG tablet Take 1,000 mcg by mouth daily.        . baclofen (LIORESAL) 10 MG tablet 1-2 tabs po bid prn back pain  120 tablet  1  . celecoxib (CELEBREX) 200 MG capsule Take 1 capsule (200 mg total) by mouth 2 (two) times daily.  60 capsule  0  . scopolamine (TRANSDERM-SCOP) 1.5 MG Place 1 patch (1.5 mg total) onto the skin every 3 (three) days.  4 patch  0    Allergies  Allergen Reactions  . Percocet (Oxycodone-Acetaminophen) Itching    Review of Systems  Review of Systems  Constitutional: Negative for fever and malaise/fatigue.  HENT: Negative for congestion.   Eyes: Negative for discharge.  Respiratory: Negative for shortness of breath and wheezing.   Cardiovascular: Negative for chest pain, palpitations and leg swelling.  Gastrointestinal: Negative for nausea, abdominal pain and diarrhea.  Genitourinary: Negative for dysuria.  Musculoskeletal: Negative for falls.  Skin: Negative for rash.  Neurological: Negative for loss of consciousness and headaches.  Endo/Heme/Allergies: Negative for polydipsia.  Psychiatric/Behavioral: Negative for depression and suicidal ideas. The patient is not nervous/anxious and does not have insomnia.     Objective  BP 92/56  Pulse 84  Temp(Src) 98.1 F (36.7 C) (Oral)  Ht 5\' 1"  (1.549 m)  Wt 240 lb 1.9 oz (108.918 kg)  BMI 45.37 kg/m2  SpO2 91%  Physical Exam  See above  Lab Results  Component Value Date   TSH 4.403 12/27/2010   Lab Results  Component Value Date   WBC 12.6* 12/27/2010   HGB 12.9 12/27/2010   HCT 40.1 12/27/2010   MCV 95.7 12/27/2010   PLT 279 12/27/2010   Lab Results  Component Value Date   CREATININE 0.85 12/27/2010   BUN 20 12/27/2010   NA 141 12/27/2010   K 4.5 12/27/2010   CL 100 12/27/2010   CO2 32 12/27/2010   Lab Results  Component Value Date   ALT 19 12/27/2010   AST 21 12/27/2010   ALKPHOS 91 12/27/2010   BILITOT 0.3 12/27/2010   Lab Results  Component Value Date    CHOL 161 11/20/2010   Lab Results  Component Value Date   HDL 52.00 11/20/2010   Lab Results  Component Value Date   LDLCALC 77 11/20/2010   Lab Results  Component Value Date   TRIG 162.0* 11/20/2010   Lab Results  Component Value Date   CHOLHDL 3 11/20/2010     Assessment & Plan  ESSENTIAL HYPERTENSION, BENIGN Well controlled today, no change in meds  HYPERLIPIDEMIA Repeat a lipid panel  DM Minimize simple carbs, continue current meds repeat [id number] today  ANXIETY DEPRESSION Doing much better, no change in meds  ANEMIA Repeat cbc today  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

## 2011-03-19 NOTE — Assessment & Plan Note (Signed)
Repeat cbc today 

## 2011-03-20 MED ORDER — EZETIMIBE-SIMVASTATIN 10-40 MG PO TABS: 1.0000 | ORAL_TABLET | Freq: Every day | ORAL | Status: DC

## 2011-03-20 NOTE — Progress Notes (Signed)
Addended by: Court Joy on: 03/20/2011 08:51 AM   Modules accepted: Orders

## 2011-05-06 HISTORY — PX: OTHER SURGICAL HISTORY: SHX169

## 2011-05-12 ENCOUNTER — Other Ambulatory Visit: Payer: Self-pay

## 2011-05-12 DIAGNOSIS — E039 Hypothyroidism, unspecified: Secondary | ICD-10-CM

## 2011-05-12 DIAGNOSIS — I10 Essential (primary) hypertension: Secondary | ICD-10-CM

## 2011-05-12 DIAGNOSIS — G609 Hereditary and idiopathic neuropathy, unspecified: Secondary | ICD-10-CM

## 2011-05-12 DIAGNOSIS — F329 Major depressive disorder, single episode, unspecified: Secondary | ICD-10-CM

## 2011-05-12 MED ORDER — LEVOTHYROXINE SODIUM 25 MCG PO TABS: 25.0000 ug | ORAL_TABLET | Freq: Every day | ORAL | Status: DC

## 2011-05-12 MED ORDER — CARVEDILOL 12.5 MG PO TABS: 6.2500 mg | ORAL_TABLET | Freq: Two times a day (BID) | ORAL | Status: DC

## 2011-05-12 MED ORDER — PANTOPRAZOLE SODIUM 40 MG PO TBEC: 40.0000 mg | DELAYED_RELEASE_TABLET | Freq: Every day | ORAL | Status: DC

## 2011-05-12 MED ORDER — AMLODIPINE BESY-BENAZEPRIL HCL 5-20 MG PO CAPS: 1.0000 | ORAL_CAPSULE | Freq: Every day | ORAL | Status: DC

## 2011-05-12 MED ORDER — GABAPENTIN 300 MG PO CAPS: 600.0000 mg | ORAL_CAPSULE | Freq: Two times a day (BID) | ORAL | Status: DC

## 2011-05-12 MED ORDER — DULOXETINE HCL 60 MG PO CPEP: 60.0000 mg | ORAL_CAPSULE | Freq: Two times a day (BID) | ORAL | Status: DC

## 2011-05-12 MED ORDER — ISOSORBIDE MONONITRATE ER 60 MG PO TB24: 60.0000 mg | ORAL_TABLET | Freq: Every day | ORAL | Status: DC

## 2011-05-12 MED ORDER — CITALOPRAM HYDROBROMIDE 40 MG PO TABS: 40.0000 mg | ORAL_TABLET | Freq: Every day | ORAL | Status: DC

## 2011-05-19 ENCOUNTER — Other Ambulatory Visit: Payer: Self-pay | Admitting: Obstetrics and Gynecology

## 2011-05-19 ENCOUNTER — Other Ambulatory Visit: Payer: Self-pay

## 2011-05-19 DIAGNOSIS — Z1231 Encounter for screening mammogram for malignant neoplasm of breast: Secondary | ICD-10-CM

## 2011-05-19 DIAGNOSIS — G609 Hereditary and idiopathic neuropathy, unspecified: Secondary | ICD-10-CM

## 2011-05-19 DIAGNOSIS — I10 Essential (primary) hypertension: Secondary | ICD-10-CM

## 2011-05-19 DIAGNOSIS — F329 Major depressive disorder, single episode, unspecified: Secondary | ICD-10-CM

## 2011-05-19 DIAGNOSIS — E039 Hypothyroidism, unspecified: Secondary | ICD-10-CM

## 2011-05-19 MED ORDER — PANTOPRAZOLE SODIUM 40 MG PO TBEC: 40.0000 mg | DELAYED_RELEASE_TABLET | Freq: Every day | ORAL | Status: DC

## 2011-05-19 MED ORDER — AMLODIPINE BESY-BENAZEPRIL HCL 5-20 MG PO CAPS: 1.0000 | ORAL_CAPSULE | Freq: Every day | ORAL | Status: DC

## 2011-05-19 MED ORDER — LEVOTHYROXINE SODIUM 25 MCG PO TABS: 25.0000 ug | ORAL_TABLET | Freq: Every day | ORAL | Status: DC

## 2011-05-19 MED ORDER — CITALOPRAM HYDROBROMIDE 40 MG PO TABS: 40.0000 mg | ORAL_TABLET | Freq: Every day | ORAL | Status: DC

## 2011-05-19 MED ORDER — ISOSORBIDE MONONITRATE ER 60 MG PO TB24: 60.0000 mg | ORAL_TABLET | Freq: Every day | ORAL | Status: DC

## 2011-05-19 MED ORDER — DULOXETINE HCL 60 MG PO CPEP
60.0000 mg | ORAL_CAPSULE | Freq: Two times a day (BID) | ORAL | Status: DC
Start: 2011-05-19 — End: 2011-06-09

## 2011-05-19 MED ORDER — GABAPENTIN 300 MG PO CAPS: 600.0000 mg | ORAL_CAPSULE | Freq: Two times a day (BID) | ORAL | Status: DC

## 2011-05-19 MED ORDER — CARVEDILOL 12.5 MG PO TABS: 6.2500 mg | ORAL_TABLET | Freq: Two times a day (BID) | ORAL | Status: DC

## 2011-06-09 ENCOUNTER — Other Ambulatory Visit: Payer: Self-pay

## 2011-06-09 DIAGNOSIS — M549 Dorsalgia, unspecified: Secondary | ICD-10-CM

## 2011-06-09 DIAGNOSIS — G609 Hereditary and idiopathic neuropathy, unspecified: Secondary | ICD-10-CM

## 2011-06-09 DIAGNOSIS — R110 Nausea: Secondary | ICD-10-CM

## 2011-06-09 DIAGNOSIS — F32A Depression, unspecified: Secondary | ICD-10-CM

## 2011-06-09 DIAGNOSIS — F329 Major depressive disorder, single episode, unspecified: Secondary | ICD-10-CM

## 2011-06-09 DIAGNOSIS — E039 Hypothyroidism, unspecified: Secondary | ICD-10-CM

## 2011-06-09 DIAGNOSIS — I10 Essential (primary) hypertension: Secondary | ICD-10-CM

## 2011-06-09 DIAGNOSIS — E119 Type 2 diabetes mellitus without complications: Secondary | ICD-10-CM

## 2011-06-09 MED ORDER — SUCRALFATE 1 G PO TABS: 1.0000 g | ORAL_TABLET | ORAL | Status: DC | PRN

## 2011-06-09 MED ORDER — LEVOTHYROXINE SODIUM 25 MCG PO TABS: 25.0000 ug | ORAL_TABLET | Freq: Every day | ORAL | Status: DC

## 2011-06-09 MED ORDER — BACLOFEN 10 MG PO TABS: ORAL_TABLET | ORAL | Status: DC

## 2011-06-09 MED ORDER — EZETIMIBE-SIMVASTATIN 10-40 MG PO TABS: 1.0000 | ORAL_TABLET | Freq: Every day | ORAL | Status: DC

## 2011-06-09 MED ORDER — ISOSORBIDE MONONITRATE ER 60 MG PO TB24: 60.0000 mg | ORAL_TABLET | Freq: Every day | ORAL | Status: DC

## 2011-06-09 MED ORDER — DULOXETINE HCL 60 MG PO CPEP: 60.0000 mg | ORAL_CAPSULE | Freq: Two times a day (BID) | ORAL | Status: DC

## 2011-06-09 MED ORDER — CARVEDILOL 12.5 MG PO TABS: 6.2500 mg | ORAL_TABLET | Freq: Two times a day (BID) | ORAL | Status: DC

## 2011-06-09 MED ORDER — CLOPIDOGREL BISULFATE 75 MG PO TABS: 75.0000 mg | ORAL_TABLET | Freq: Every day | ORAL | Status: DC

## 2011-06-09 MED ORDER — AMLODIPINE BESY-BENAZEPRIL HCL 5-20 MG PO CAPS: 1.0000 | ORAL_CAPSULE | Freq: Every day | ORAL | Status: DC

## 2011-06-09 MED ORDER — GABAPENTIN 300 MG PO CAPS: 600.0000 mg | ORAL_CAPSULE | Freq: Two times a day (BID) | ORAL | Status: DC

## 2011-06-09 MED ORDER — METFORMIN HCL 1000 MG PO TABS: 1000.0000 mg | ORAL_TABLET | Freq: Two times a day (BID) | ORAL | Status: DC

## 2011-06-09 MED ORDER — CELECOXIB 200 MG PO CAPS: 200.0000 mg | ORAL_CAPSULE | Freq: Two times a day (BID) | ORAL | Status: DC

## 2011-06-09 MED ORDER — PANTOPRAZOLE SODIUM 40 MG PO TBEC: 40.0000 mg | DELAYED_RELEASE_TABLET | Freq: Every day | ORAL | Status: DC

## 2011-06-09 MED ORDER — CITALOPRAM HYDROBROMIDE 40 MG PO TABS: 40.0000 mg | ORAL_TABLET | Freq: Every day | ORAL | Status: DC

## 2011-06-26 ENCOUNTER — Ambulatory Visit
Admission: RE | Admit: 2011-06-26 | Discharge: 2011-06-26 | Disposition: A | Discharge status: Home / Self Care | Payer: Medicare Other | Source: Ambulatory Visit | Attending: Obstetrics and Gynecology | Admitting: Obstetrics and Gynecology

## 2011-06-26 DIAGNOSIS — Z1231 Encounter for screening mammogram for malignant neoplasm of breast: Secondary | ICD-10-CM

## 2011-07-05 ENCOUNTER — Other Ambulatory Visit: Payer: Self-pay | Admitting: Family Medicine

## 2011-09-08 ENCOUNTER — Ambulatory Visit: Payer: Medicare Other | Admitting: Family Medicine

## 2011-09-08 ENCOUNTER — Ambulatory Visit (INDEPENDENT_AMBULATORY_CARE_PROVIDER_SITE_OTHER): Payer: Medicare Other | Admitting: Family Medicine

## 2011-09-08 ENCOUNTER — Encounter: Payer: Self-pay | Admitting: Family Medicine

## 2011-09-08 VITALS — BP 124/82 | HR 88 | Temp 97.4°F | Ht 61.0 in | Wt 257.0 lb

## 2011-09-08 DIAGNOSIS — I10 Essential (primary) hypertension: Secondary | ICD-10-CM

## 2011-09-08 DIAGNOSIS — M549 Dorsalgia, unspecified: Secondary | ICD-10-CM

## 2011-09-08 DIAGNOSIS — N390 Urinary tract infection, site not specified: Secondary | ICD-10-CM

## 2011-09-08 DIAGNOSIS — R300 Dysuria: Secondary | ICD-10-CM

## 2011-09-08 DIAGNOSIS — N200 Calculus of kidney: Secondary | ICD-10-CM

## 2011-09-08 HISTORY — DX: Urinary tract infection, site not specified: N39.0

## 2011-09-08 LAB — POCT URINALYSIS DIPSTICK
Glucose, UA: NEGATIVE
Nitrite, UA: POSITIVE
Urobilinogen, UA: 1

## 2011-09-08 MED ORDER — ALIGN PO CAPS: 1.0000 | ORAL_CAPSULE | Freq: Every day | ORAL | Status: DC

## 2011-09-08 MED ORDER — CEFDINIR 300 MG PO CAPS: 300.0000 mg | ORAL_CAPSULE | Freq: Two times a day (BID) | ORAL | Status: AC

## 2011-09-08 MED ORDER — MEPERIDINE HCL 50 MG PO TABS: 50.0000 mg | ORAL_TABLET | Freq: Four times a day (QID) | ORAL | Status: AC | PRN

## 2011-09-08 NOTE — Patient Instructions (Signed)

## 2011-09-08 NOTE — Assessment & Plan Note (Addendum)
Cefdinir 300 mg po bid x 5 days, start cranberry tabs. Increase hydration, report concerning symptoms. Due to history of stones and travel starting tomorrow, is given a small prescription of Demerol to use prn, has used previously

## 2011-09-08 NOTE — Assessment & Plan Note (Signed)
Try pain patches to back for car ride this week. Is taking a long car ride back to [place] to bury her twin brother.

## 2011-09-08 NOTE — Assessment & Plan Note (Signed)
Adequately controlled, no change in meds today 

## 2011-09-08 NOTE — Progress Notes (Signed)
Addended by: Baldemar Lenis R on: 09/08/2011 12:05 PM   Modules accepted: Orders

## 2011-09-08 NOTE — Progress Notes (Signed)
Patient ID: Amber [name], female   DOB: [birth year], 66 y.o.   MRN: [id number] Amber [name] [id number] 12/01/1945 09/08/2011      Progress Note-Follow Up  Subjective  Chief Complaint  Chief Complaint  Patient presents with  . Urinary Tract Infection    lower back pain, burning when urinating    HPI  Patient is a 66 year old Caucasian female in today for increased urinary symptoms. Symptoms for the last 24 hours. She is struggling with intermittent dysuria, urinary frequency and urgency. She awoke during the night and felt the need to go in and was unable to urinate. She denies incontinence. She denies change in bowels. She denies fevers and chills. She has chronic back pain and myalgias but these are not worse. She does have increased abdominal discomfort. She has a distant history of a kidney stone and is concerned that this may recur. They're traveling up to [place] tomorrow for the funeral of her twin brother and her concern she'll develop kidney stones on their trip. No chest pain or palpitations no anorexia, nausea or vomiting are noted  Past Medical History  Diagnosis Date  . Thyroid disease   . Hyperlipidemia   . Hypertension     5 stents  . Arthritis   . Depression   . Diabetes mellitus     type 2  . Hypoxia 10/07/2010  . Skin lesion of back 10/18/2010  . Viral infection characterized by skin and mucous membrane lesions 10/18/2010  . Vitamin d deficiency 11/21/2010  . Back pain 11/21/2010  . Motion sickness 12/04/2010  . UTI (lower urinary tract infection) 09/08/2011    Past Surgical History  Procedure Date  . Angioplasty 1980  . Angioplasty 2006    3 stents  . Angioplasty 2009    2 stents  . Cholecystectomy   . Thyroidectomy 1950    for goiter  . Abdominal hysterectomy 1977    partial  . Diverticulitis 1978    exploratory  . Right rotator cuff repair 2004  . Left rotator cuff repair 2004  . Bone fusion left foot 2010    7 screws in foot, bunionectomy    . Hip surgery     large bone spur removed, right  . Back surgery 2004    lumbar discectomy for bulging disc    Family History  Problem Relation Age of Onset  . Heart disease Father   . Diabetes Sister   . Hypertension Sister   . Depression Sister   . Cancer Sister     colon cancer s/p colectomy  . Emphysema Brother     smoker  . Diabetes Daughter   . Hypertension Daughter   . Depression Daughter   . Diverticulitis Daughter   . Other Daughter     Trichotrilomania  . Aneurysm Maternal Grandmother     brain aneurysm  . Diabetes Paternal Grandmother   . Hypertension Paternal Grandmother   . Cancer Paternal Grandmother     colon  . Crohn's disease Paternal Grandfather   . Emphysema Brother     smoker  . Heart disease Brother   . Diabetes Brother   . Cancer Brother     thyroid  . Coronary artery disease Brother     s/p MI's and 7 stents  . Diabetes Brother   . Other Brother     back disease w/ bulging discs  . Colon polyps Brother     esophageal and tongue polyps  . Diabetes Sister   .  Other Sister     short term memory loss  . Aneurysm Sister     brain aneursm s/p coiling  . Diabetes Son   . Anxiety disorder Son     History   Social History  . Marital Status: Married    Spouse Name: N/A    Number of Children: N/A  . Years of Education: N/A   Occupational History  . retired Geologist, engineering    Social History Main Topics  . Smoking status: Never Smoker   . Smokeless tobacco: Never Used  . Alcohol Use: No  . Drug Use: No  . Sexually Active: Yes -- Female partner(s)   Other Topics Concern  . Not on file   Social History Narrative  . No narrative on file    Current Outpatient Prescriptions on File Prior to Visit  Medication Sig Dispense Refill  . amLODipine-benazepril (LOTREL) 5-20 MG per capsule Take 1 capsule by mouth daily.  90 capsule  1  . aspirin 81 MG tablet Take 81 mg by mouth 2 (two) times daily.       . carvedilol (COREG) 12.5 MG tablet  Take 0.5 tablets (6.25 mg total) by mouth 2 (two) times daily.  180 tablet  1  . Cholecalciferol (VITAMIN D-3 PO) Take 2 tablets by mouth daily.       . citalopram (CELEXA) 40 MG tablet Take 1 tablet (40 mg total) by mouth daily.  90 tablet  1  . clopidogrel (PLAVIX) 75 MG tablet Take 1 tablet (75 mg total) by mouth daily.  90 tablet  1  . DULoxetine (CYMBALTA) 60 MG capsule Take 1 capsule (60 mg total) by mouth 2 (two) times daily.  180 capsule  1  . ezetimibe-simvastatin (VYTORIN) 10-40 MG per tablet Take 1 tablet by mouth at bedtime.  90 tablet  1  . gabapentin (NEURONTIN) 300 MG capsule Take 2 capsules (600 mg total) by mouth 2 (two) times daily.  360 capsule  1  . isosorbide mononitrate (IMDUR) 60 MG 24 hr tablet Take 1 tablet (60 mg total) by mouth daily.  90 tablet  1  . levothyroxine (SYNTHROID, LEVOTHROID) 25 MCG tablet Take 1 tablet (25 mcg total) by mouth daily.  90 tablet  1  . metFORMIN (GLUCOPHAGE) 1000 MG tablet Take 1 tablet (1,000 mg total) by mouth 2 (two) times daily with a meal.  180 tablet  1  . pantoprazole (PROTONIX) 40 MG tablet Take 1 tablet (40 mg total) by mouth daily.  90 tablet  1  . Probiotic Product (PROBIOTIC FORMULA PO) Take by mouth.        . sucralfate (CARAFATE) 1 G tablet Take 1 tablet (1 g total) by mouth as needed.  90 tablet  1  . scopolamine (TRANSDERM-SCOP) 1.5 MG Place 1 patch (1.5 mg total) onto the skin every 3 (three) days.  4 patch  0  . DISCONTD: metFORMIN (GLUCOPHAGE) 1000 MG tablet TAKE 1 TABLET TWICE A DAY WITH FOOD  60 tablet  1    Allergies  Allergen Reactions  . Percocet (Oxycodone-Acetaminophen) Itching    Review of Systems  Review of Systems  Constitutional: Negative for fever, chills and malaise/fatigue.  HENT: Negative for congestion.   Eyes: Negative for discharge.  Respiratory: Negative for shortness of breath.   Cardiovascular: Negative for chest pain, palpitations and leg swelling.  Gastrointestinal: Negative for nausea,  abdominal pain and diarrhea.  Genitourinary: Positive for dysuria, urgency, frequency and hematuria. Negative for flank pain.  Musculoskeletal: Positive for myalgias and back pain. Negative for falls.       Baseline pain, no increase  Skin: Negative for rash.  Neurological: Negative for loss of consciousness and headaches.  Endo/Heme/Allergies: Negative for polydipsia.  Psychiatric/Behavioral: Negative for depression and suicidal ideas. The patient is not nervous/anxious and does not have insomnia.     Objective  BP 141/85  Pulse 88  Temp(Src) 97.4 F (36.3 C) (Temporal)  Ht 5\' 1"  (1.549 m)  Wt 257 lb (116.574 kg)  BMI 48.56 kg/m2  SpO2 91%  Physical Exam  Physical Exam  Constitutional: She is oriented to person, place, and time and well-developed, well-nourished, and in no distress. No distress.  HENT:  Head: Normocephalic and atraumatic.  Eyes: Conjunctivae are normal.  Neck: Neck supple. No thyromegaly present.  Cardiovascular: Normal rate, regular rhythm and normal heart sounds.   No murmur heard. Pulmonary/Chest: Effort normal and breath sounds normal. She has no wheezes.  Abdominal: She exhibits no distension and no mass.  Musculoskeletal: She exhibits no edema.  Lymphadenopathy:    She has no cervical adenopathy.  Neurological: She is alert and oriented to person, place, and time.  Skin: Skin is warm and dry. No rash noted. She is not diaphoretic.  Psychiatric: Memory, affect and judgment normal.    Lab Results  Component Value Date   TSH 1.20 03/19/2011   Lab Results  Component Value Date   WBC 12.6* 12/27/2010   HGB 12.9 12/27/2010   HCT 40.1 12/27/2010   MCV 95.7 12/27/2010   PLT 279 12/27/2010   Lab Results  Component Value Date   CREATININE 1.1 03/19/2011   BUN 19 03/19/2011   NA 141 03/19/2011   K 4.4 03/19/2011   CL 104 03/19/2011   CO2 30 03/19/2011   Lab Results  Component Value Date   ALT 21 03/19/2011   AST 23 03/19/2011   ALKPHOS 75  03/19/2011   BILITOT 0.4 03/19/2011   Lab Results  Component Value Date   CHOL 142 03/19/2011   Lab Results  Component Value Date   HDL 34.90* 03/19/2011   Lab Results  Component Value Date   LDLCALC 70 03/19/2011   Lab Results  Component Value Date   TRIG 187.0* 03/19/2011   Lab Results  Component Value Date   CHOLHDL 4 03/19/2011     Assessment & Plan  UTI (lower urinary tract infection) Cefdinir 300 mg po bid x 5 days, start cranberry tabs. Increase hydration, report concerning symptoms. Due to history of stones and travel starting tomorrow, is given a small prescription of Demerol to use prn, has used previously  Back pain Try pain patches to back for car ride this week. Is taking a long car ride back to [place] to bury her twin brother.   ESSENTIAL HYPERTENSION, BENIGN Adequately controlled, no change in meds today

## 2011-10-06 ENCOUNTER — Encounter: Payer: Self-pay | Admitting: Family Medicine

## 2011-10-06 ENCOUNTER — Ambulatory Visit (INDEPENDENT_AMBULATORY_CARE_PROVIDER_SITE_OTHER): Payer: Medicare Other | Admitting: Family Medicine

## 2011-10-06 VITALS — BP 104/65 | HR 88 | Temp 97.0°F | Ht 61.0 in | Wt 261.4 lb

## 2011-10-06 DIAGNOSIS — R232 Flushing: Secondary | ICD-10-CM

## 2011-10-06 DIAGNOSIS — E039 Hypothyroidism, unspecified: Secondary | ICD-10-CM

## 2011-10-06 DIAGNOSIS — E119 Type 2 diabetes mellitus without complications: Secondary | ICD-10-CM

## 2011-10-06 DIAGNOSIS — I10 Essential (primary) hypertension: Secondary | ICD-10-CM

## 2011-10-06 DIAGNOSIS — N390 Urinary tract infection, site not specified: Secondary | ICD-10-CM

## 2011-10-06 DIAGNOSIS — N951 Menopausal and female climacteric states: Secondary | ICD-10-CM

## 2011-10-06 HISTORY — DX: Flushing: R23.2

## 2011-10-06 LAB — CBC
Hemoglobin: 12.1 g/dL (ref 12.0–15.0)
MCHC: 33.1 g/dL (ref 30.0–36.0)
MCV: 93.2 fl (ref 78.0–100.0)
Platelets: 215 10*3/uL (ref 150.0–400.0)
RBC: 3.92 Mil/uL (ref 3.87–5.11)

## 2011-10-06 LAB — RENAL FUNCTION PANEL
BUN: 22 mg/dL (ref 6–23)
CO2: 30 mEq/L (ref 19–32)
Calcium: 9.3 mg/dL (ref 8.4–10.5)
Chloride: 102 mEq/L (ref 96–112)
Creatinine, Ser: 0.8 mg/dL (ref 0.4–1.2)
Sodium: 140 mEq/L (ref 135–145)

## 2011-10-06 LAB — POCT URINALYSIS DIPSTICK
Blood, UA: NEGATIVE
Protein, UA: NEGATIVE
Spec Grav, UA: 1.025
Urobilinogen, UA: 2

## 2011-10-06 LAB — HEMOGLOBIN A1C: Hgb A1c MFr Bld: 6.9 % — ABNORMAL HIGH (ref 4.6–6.5)

## 2011-10-06 MED ORDER — ONETOUCH ULTRA MINI W/DEVICE KIT: PACK | Status: DC

## 2011-10-06 MED ORDER — ONETOUCH LANCETS MISC: Status: DC

## 2011-10-06 MED ORDER — GLUCOSE BLOOD VI STRP: ORAL_STRIP | Status: DC

## 2011-10-06 NOTE — Assessment & Plan Note (Signed)
Adequately controlled, no changes 

## 2011-10-06 NOTE — Progress Notes (Signed)
Addended by: Baldemar Lenis R on: 10/06/2011 04:32 PM   Modules accepted: Orders

## 2011-10-06 NOTE — Assessment & Plan Note (Signed)
Recheck thyroid today due to symptoms

## 2011-10-06 NOTE — Assessment & Plan Note (Addendum)
Worst in am and likely related to only drinking hot caffeinated coffe in am and eating a big, white bagel. Encouraged to try bagel thins, add a healthy proteins, cut back on caffeind and hot liquids and add a [employer] cap daily. Consider hormones if no improvement

## 2011-10-06 NOTE — Progress Notes (Signed)
Patient ID: Amber [name], female   DOB: [birth year], 66 y.o.   MRN: [id number] Amber [name] [id number] Sep 25, 1945 10/06/2011      Progress Note-Follow Up  Subjective  Chief Complaint  Chief Complaint  Patient presents with  . hormonal complication    sweats and exhausted    HPI  Patient is a 66-year-old Caucasian female in today by her husband. They're concerned about her increasing hot flashes. She has roughly 4 each morning. Tends to be better in the afternoon and does not awaken her at night. On questioning she is alert white flour bagel in the morning and drinks coffee does not eat anything else. Her hot flashes are flat. Recently they've begun to recheck her blood sugars in the morning are running around 160 tablet before and after eating. No chest pain or palpitations or shortness of breath does struggle with fatigue. Denies any urinary symptoms such as polyuria but has been treated for recent UTI. No abdominal pain change in bowels or other concerns noted today  Past Medical History  Diagnosis Date  . Thyroid disease   . Hyperlipidemia   . Hypertension     5 stents  . Arthritis   . Depression   . Diabetes mellitus     type 2  . Hypoxia 10/07/2010  . Skin lesion of back 10/18/2010  . Viral infection characterized by skin and mucous membrane lesions 10/18/2010  . Vitamin d deficiency 11/21/2010  . Back pain 11/21/2010  . Motion sickness 12/04/2010  . UTI (lower urinary tract infection) 09/08/2011  . Hot flashes 10/06/2011    Past Surgical History  Procedure Date  . Angioplasty 1980  . Angioplasty 2006    3 stents  . Angioplasty 2009    2 stents  . Cholecystectomy   . Thyroidectomy 1950    for goiter  . Abdominal hysterectomy 1977    partial  . Diverticulitis 1978    exploratory  . Right rotator cuff repair 2004  . Left rotator cuff repair 2004  . Bone fusion left foot 2010    7 screws in foot, bunionectomy  . Hip surgery     large bone spur removed, right    . Back surgery 2004    lumbar discectomy for bulging disc    Family History  Problem Relation Age of Onset  . Heart disease Father   . Diabetes Sister   . Hypertension Sister   . Depression Sister   . Cancer Sister     colon cancer s/p colectomy  . Emphysema Brother     smoker  . Diabetes Daughter   . Hypertension Daughter   . Depression Daughter   . Diverticulitis Daughter   . Other Daughter     Trichotrilomania  . Aneurysm Maternal Grandmother     brain aneurysm  . Diabetes Paternal Grandmother   . Hypertension Paternal Grandmother   . Cancer Paternal Grandmother     colon  . Crohn's disease Paternal Grandfather   . Emphysema Brother     smoker  . Heart disease Brother   . Diabetes Brother   . Cancer Brother     thyroid  . Coronary artery disease Brother     s/p MI's and 7 stents  . Diabetes Brother   . Other Brother     back disease w/ bulging discs  . Colon polyps Brother     esophageal and tongue polyps  . Diabetes Sister   . Other Sister  short term memory loss  . Aneurysm Sister     brain aneursm s/p coiling  . Diabetes Son   . Anxiety disorder Son     History   Social History  . Marital Status: Married    Spouse Name: N/A    Number of Children: N/A  . Years of Education: N/A   Occupational History  . retired Geologist, engineering    Social History Main Topics  . Smoking status: Never Smoker   . Smokeless tobacco: Never Used  . Alcohol Use: No  . Drug Use: No  . Sexually Active: Yes -- Female partner(s)   Other Topics Concern  . Not on file   Social History Narrative  . No narrative on file    Current Outpatient Prescriptions on File Prior to Visit  Medication Sig Dispense Refill  . amLODipine-benazepril (LOTREL) 5-20 MG per capsule Take 1 capsule by mouth daily.  90 capsule  1  . aspirin 81 MG tablet Take 81 mg by mouth 2 (two) times daily.       . carvedilol (COREG) 12.5 MG tablet Take 0.5 tablets (6.25 mg total) by mouth 2 (two)  times daily.  180 tablet  1  . Cholecalciferol (VITAMIN D-3 PO) Take 2 tablets by mouth daily.       . citalopram (CELEXA) 40 MG tablet Take 1 tablet (40 mg total) by mouth daily.  90 tablet  1  . clopidogrel (PLAVIX) 75 MG tablet Take 1 tablet (75 mg total) by mouth daily.  90 tablet  1  . DULoxetine (CYMBALTA) 60 MG capsule Take 1 capsule (60 mg total) by mouth 2 (two) times daily.  180 capsule  1  . ezetimibe-simvastatin (VYTORIN) 10-40 MG per tablet Take 1 tablet by mouth at bedtime.  90 tablet  1  . gabapentin (NEURONTIN) 300 MG capsule Take 2 capsules (600 mg total) by mouth 2 (two) times daily.  360 capsule  1  . isosorbide mononitrate (IMDUR) 60 MG 24 hr tablet Take 1 tablet (60 mg total) by mouth daily.  90 tablet  1  . levothyroxine (SYNTHROID, LEVOTHROID) 25 MCG tablet Take 1 tablet (25 mcg total) by mouth daily.  90 tablet  1  . metFORMIN (GLUCOPHAGE) 1000 MG tablet Take 1 tablet (1,000 mg total) by mouth 2 (two) times daily with a meal.  180 tablet  1  . pantoprazole (PROTONIX) 40 MG tablet Take 1 tablet (40 mg total) by mouth daily.  90 tablet  1  . Probiotic Product (PROBIOTIC FORMULA PO) Take by mouth.        . sucralfate (CARAFATE) 1 G tablet Take 1 tablet (1 g total) by mouth as needed.  90 tablet  1  . scopolamine (TRANSDERM-SCOP) 1.5 MG Place 1 patch (1.5 mg total) onto the skin every 3 (three) days.  4 patch  0    Allergies  Allergen Reactions  . Percocet (Oxycodone-Acetaminophen) Itching    Review of Systems  Review of Systems  Constitutional: Positive for malaise/fatigue. Negative for fever.  HENT: Negative for congestion.   Eyes: Negative for discharge.  Respiratory: Negative for shortness of breath.   Cardiovascular: Negative for chest pain, palpitations and leg swelling.  Gastrointestinal: Negative for nausea, abdominal pain and diarrhea.  Genitourinary: Negative for dysuria.  Musculoskeletal: Negative for falls.  Skin: Negative for rash.  Neurological:  Negative for loss of consciousness and headaches.  Endo/Heme/Allergies: Negative for polydipsia.  Psychiatric/Behavioral: Negative for depression and suicidal ideas. The patient is not  nervous/anxious and does not have insomnia.     Objective  BP 104/65  Pulse 88  Temp(Src) 97 F (36.1 C) (Temporal)  Ht 5\' 1"  (1.549 m)  Wt 261 lb 6.4 oz (118.57 kg)  BMI 49.39 kg/m2  SpO2 89%  Physical Exam  Physical Exam  Constitutional: She is oriented to person, place, and time and well-developed, well-nourished, and in no distress. No distress.       obese  HENT:  Head: Normocephalic and atraumatic.  Eyes: Conjunctivae are normal.  Neck: Neck supple. No thyromegaly present.  Cardiovascular: Normal rate, regular rhythm and normal heart sounds.   No murmur heard. Pulmonary/Chest: Effort normal and breath sounds normal. She has no wheezes.  Abdominal: She exhibits no distension and no mass.  Musculoskeletal: She exhibits no edema.  Lymphadenopathy:    She has no cervical adenopathy.  Neurological: She is alert and oriented to person, place, and time.  Skin: Skin is warm and dry. No rash noted. She is not diaphoretic.  Psychiatric: Memory, affect and judgment normal.    Lab Results  Component Value Date   TSH 1.20 03/19/2011   Lab Results  Component Value Date   WBC 12.6* 12/27/2010   HGB 12.9 12/27/2010   HCT 40.1 12/27/2010   MCV 95.7 12/27/2010   PLT 279 12/27/2010   Lab Results  Component Value Date   CREATININE 1.1 03/19/2011   BUN 19 03/19/2011   NA 141 03/19/2011   K 4.4 03/19/2011   CL 104 03/19/2011   CO2 30 03/19/2011   Lab Results  Component Value Date   ALT 21 03/19/2011   AST 23 03/19/2011   ALKPHOS 75 03/19/2011   BILITOT 0.4 03/19/2011   Lab Results  Component Value Date   CHOL 142 03/19/2011   Lab Results  Component Value Date   HDL 34.90* 03/19/2011   Lab Results  Component Value Date   LDLCALC 70 03/19/2011   Lab Results  Component Value Date     TRIG 187.0* 03/19/2011   Lab Results  Component Value Date   CHOLHDL 4 03/19/2011     Assessment & Plan  DM Patient has been seeing blood sugars in the 150s and 160s in the am. Sometimes she has eaten and sometimes she has not. Encouraged to change her diet and use more complex carbs. Check hgba1c  ESSENTIAL HYPERTENSION, BENIGN Adequately controlled, no changes  Hot flashes Worst in am and likely related to only drinking hot caffeinated coffe in am and eating a big, white bagel. Encouraged to try bagel thins, add a healthy proteins, cut back on caffeind and hot liquids and add a [employer] cap daily. Consider hormones if no improvement  UTI (lower urinary tract infection) Repeat UA and culture today due to recent treatment  Unspecified hypothyroidism Recheck thyroid today due to symptoms

## 2011-10-06 NOTE — Patient Instructions (Signed)
Hypoglycemia (Low Blood Sugar) Hypoglycemia is when the glucose (sugar) in your blood is too low. Hypoglycemia can happen for many reasons. It can happen to people with or without diabetes. Hypoglycemia can develop quickly and can be a medical emergency.  CAUSES  Having hypoglycemia does not mean that you will develop diabetes. Different causes include:  Missed or delayed meals or not enough carbohydrates eaten.   Medication overdose. This could be by accident or deliberate. If by accident, your medication may need to be adjusted or changed.   Exercise or increased activity without adjustments in carbohydrates or medications.   A nerve disorder that affects body functions like your heart rate, blood pressure and digestion (autonomic neuropathy).   A condition where the stomach muscles do not function properly (gastroparesis). Therefore, medications may not absorb properly.   The inability to recognize the signs of hypoglycemia (hypoglycemic unawareness).   Absorption of insulin - may be altered.   Alcohol consumption.   Pregnancy/menstrual cycles/postpartum. This may be due to hormones.   Certain kinds of tumors. This is very rare.  SYMPTOMS   Sweating.   Hunger.   Dizziness.   Blurred vision.   Drowsiness.   Weakness.   Headache.   Rapid heart beat.   Shakiness.   Nervousness.  DIAGNOSIS  Diagnosis is made by monitoring blood glucose in one or all of the following ways:  Fingerstick blood glucose monitoring.   Laboratory results.  TREATMENT  If you think your blood glucose is low:  Check your blood glucose, if possible. If it is less than 70 mg/dl, take one of the following:   3-4 glucose tablets.    cup juice (prefer clear like apple).    cup "regular" soda pop.   1 cup milk.   -1 tube of glucose gel.   5-6 hard candies.   Do not over treat because your blood glucose (sugar) will only go too high.   Wait 15 minutes and recheck your blood  glucose. If it is still less than 70 mg/dl (or below your target range), repeat treatment.   Eat a snack if it is more than one hour until your next meal.  Sometimes, your blood glucose may go so low that you are unable to treat yourself. You may need someone to help you. You may even pass out or be unable to swallow. This may require you to get an injection of glucagon, which raises the blood glucose. HOME CARE INSTRUCTIONS  Check blood glucose as recommended by your caregiver.   Take medication as prescribed by your caregiver.   Follow your meal plan. Do not skip meals. Eat on time.   If you are going to drink alcohol, drink it only with meals.   Check your blood glucose before driving.   Check your blood glucose before and after exercise. If you exercise longer or different than usual, be sure to check blood glucose more frequently.   Always carry treatment with you. Glucose tablets are the easiest to carry.   Always wear medical alert jewelry or carry some form of identification that states that you have diabetes. This will alert people that you have diabetes. If you have hypoglycemia, they will have a better idea on what to do.  SEEK MEDICAL CARE IF:   You are having problems keeping your blood sugar at target range.   You are having frequent episodes of hypoglycemia.   You feel you might be having side effects from your medicines.  You have symptoms of an illness that is not improving after 3-4 days.   You notice a change in vision or a new problem with your vision.  SEEK IMMEDIATE MEDICAL CARE IF:   You are a family member or friend of a person whose blood glucose goes below 70 mg/dl and is accompanied by:   Confusion.   A change in mental status.   The inability to swallow.   Passing out.  Document Released: 04/21/2005 Document Revised: 04/10/2011 Document Reviewed: 12/14/2008 [facility] Patient Information 2012 [place], [place].   Try [employer] caps  daily Cut back on the caffeine. Add a healthy protein to your morning meals. Call if hot flashes continue

## 2011-10-06 NOTE — Progress Notes (Signed)
Addended by: Court Joy on: 10/06/2011 04:20 PM   Modules accepted: Orders

## 2011-10-06 NOTE — Assessment & Plan Note (Signed)
Patient has been seeing blood sugars in the 150s and 160s in the am. Sometimes she has eaten and sometimes she has not. Encouraged to change her diet and use more complex carbs. Check hgba1c

## 2011-10-06 NOTE — Assessment & Plan Note (Signed)
Repeat UA and culture today due to recent treatment

## 2011-10-09 ENCOUNTER — Telehealth: Payer: Self-pay | Admitting: Family Medicine

## 2011-10-09 LAB — URINE CULTURE: Colony Count: >=100000

## 2011-10-09 MED ORDER — CIPROFLOXACIN HCL 250 MG PO TABS: 250.0000 mg | ORAL_TABLET | Freq: Two times a day (BID) | ORAL | Status: DC

## 2011-10-09 NOTE — Telephone Encounter (Signed)
Diane informed.

## 2011-10-09 NOTE — Telephone Encounter (Signed)
Notify urine looks to be infected again, have her start Ciprofloxacin 250 mg po bid x 7 days, we will get the urine sensitivities in a day or 2 ------  Notes Recorded by Bradd Canary, MD on 10/06/2011 at 8:32 PM Labs look good. Wbc normal, thyroid normal, sugar up to hgba1c of 6.9 but still below 7.0, avoid simple carbs, increase activity and recheck in 3 months Read notes to patient, patient understands & will pick up Rx at [facility] [address].

## 2011-10-09 NOTE — Progress Notes (Signed)
Addended by: Court Joy on: 10/09/2011 12:11 PM   Modules accepted: Orders

## 2011-10-21 ENCOUNTER — Other Ambulatory Visit: Payer: Self-pay

## 2011-10-21 MED ORDER — EZETIMIBE-SIMVASTATIN 10-40 MG PO TABS: 1.0000 | ORAL_TABLET | Freq: Every day | ORAL | Status: DC

## 2011-11-11 ENCOUNTER — Ambulatory Visit (INDEPENDENT_AMBULATORY_CARE_PROVIDER_SITE_OTHER): Payer: Medicare Other | Admitting: Family Medicine

## 2011-11-11 ENCOUNTER — Encounter: Payer: Self-pay | Admitting: Family Medicine

## 2011-11-11 VITALS — BP 107/72 | HR 83 | Temp 98.1°F | Resp 18 | Wt 262.1 lb

## 2011-11-11 DIAGNOSIS — F341 Dysthymic disorder: Secondary | ICD-10-CM

## 2011-11-11 DIAGNOSIS — I10 Essential (primary) hypertension: Secondary | ICD-10-CM

## 2011-11-11 DIAGNOSIS — R0902 Hypoxemia: Secondary | ICD-10-CM

## 2011-11-11 DIAGNOSIS — N951 Menopausal and female climacteric states: Secondary | ICD-10-CM

## 2011-11-11 DIAGNOSIS — E785 Hyperlipidemia, unspecified: Secondary | ICD-10-CM

## 2011-11-11 DIAGNOSIS — E119 Type 2 diabetes mellitus without complications: Secondary | ICD-10-CM

## 2011-11-11 DIAGNOSIS — R232 Flushing: Secondary | ICD-10-CM

## 2011-11-11 LAB — LIPID PANEL
Cholesterol: 149 mg/dL (ref 0–200)
LDL Cholesterol: 78 mg/dL (ref 0–99)

## 2011-11-11 NOTE — Assessment & Plan Note (Signed)
Adequately controlled despite her daughter's family recently moving in with them

## 2011-11-11 NOTE — Patient Instructions (Addendum)

## 2011-11-11 NOTE — Assessment & Plan Note (Signed)
Check lipids today, cont current meds for now

## 2011-11-11 NOTE — Assessment & Plan Note (Signed)
Encouraged her to use her O2 as needed during the day and qhs

## 2011-11-11 NOTE — Progress Notes (Signed)
Patient ID: Amber [name], female   DOB: June 24, 1945, 66 y.o.   MRN: [id number] XIANNA SIVERLING [id number] 07/24/1945 11/11/2011      Progress Note-Follow Up  Subjective  Chief Complaint  Chief Complaint  Patient presents with  . Follow-up    DM    HPI  Patient is a 66 year old Caucasian female who is in today for followup on multiple medical problems. Her sugars have been adequately controlled and she denies any polyuria or polydipsia. She's not had any fevers, chills, chest pain, palpitations, GI or GU complaints since last seen. She continues to have roughly 1 daily usually around 11 AM after she exerts herself cleaning the house. She feels winded and when she rests for a few minutes she feels better. She notes since increasing the amount she exercises or exerts herself the symptoms have improved. No associated chest pain, palpitations, nausea. Her adult daughter with her husband children and pets have moved in recently and the stresses been great but overall she feels her depression and anxiety are doing well.   Past Medical History  Diagnosis Date  . Thyroid disease   . Hyperlipidemia   . Hypertension     5 stents  . Arthritis   . Depression   . Diabetes mellitus     type 2  . Hypoxia 10/07/2010  . Skin lesion of back 10/18/2010  . Viral infection characterized by skin and mucous membrane lesions 10/18/2010  . Vitamin d deficiency 11/21/2010  . Back pain 11/21/2010  . Motion sickness 12/04/2010  . UTI (lower urinary tract infection) 09/08/2011  . Hot flashes 10/06/2011    Past Surgical History  Procedure Date  . Angioplasty 1980  . Angioplasty 2006    3 stents  . Angioplasty 2009    2 stents  . Cholecystectomy   . Thyroidectomy 1950    for goiter  . Abdominal hysterectomy 1977    partial  . Diverticulitis 1978    exploratory  . Right rotator cuff repair 2004  . Left rotator cuff repair 2004  . Bone fusion left foot 2010    7 screws in foot, bunionectomy  . Hip  surgery     large bone spur removed, right  . Back surgery 2004    lumbar discectomy for bulging disc    Family History  Problem Relation Age of Onset  . Heart disease Father   . Diabetes Sister   . Hypertension Sister   . Depression Sister   . Cancer Sister     colon cancer s/p colectomy  . Emphysema Brother     smoker  . Diabetes Daughter   . Hypertension Daughter   . Depression Daughter   . Diverticulitis Daughter   . Other Daughter     Trichotrilomania  . Aneurysm Maternal Grandmother     brain aneurysm  . Diabetes Paternal Grandmother   . Hypertension Paternal Grandmother   . Cancer Paternal Grandmother     colon  . Crohn's disease Paternal Grandfather   . Emphysema Brother     smoker  . Heart disease Brother   . Diabetes Brother   . Cancer Brother     thyroid  . Coronary artery disease Brother     s/p MI's and 7 stents  . Diabetes Brother   . Other Brother     back disease w/ bulging discs  . Colon polyps Brother     esophageal and tongue polyps  . Diabetes Sister   . Other Sister  short term memory loss  . Aneurysm Sister     brain aneursm s/p coiling  . Diabetes Son   . Anxiety disorder Son     History   Social History  . Marital Status: Married    Spouse Name: N/A    Number of Children: N/A  . Years of Education: N/A   Occupational History  . retired Geologist, engineering    Social History Main Topics  . Smoking status: Never Smoker   . Smokeless tobacco: Never Used  . Alcohol Use: No  . Drug Use: No  . Sexually Active: Yes -- Female partner(s)   Other Topics Concern  . Not on file   Social History Narrative  . No narrative on file    Current Outpatient Prescriptions on File Prior to Visit  Medication Sig Dispense Refill  . amLODipine-benazepril (LOTREL) 5-20 MG per capsule Take 1 capsule by mouth daily.  90 capsule  1  . aspirin 81 MG tablet Take 81 mg by mouth 2 (two) times daily.       . Blood Glucose Monitoring Suppl (ONE TOUCH  ULTRA MINI) W/DEVICE KIT Check BS qam and as needed  1 each  3  . carvedilol (COREG) 12.5 MG tablet Take 0.5 tablets (6.25 mg total) by mouth 2 (two) times daily.  180 tablet  1  . Cholecalciferol (VITAMIN D-3 PO) Take 2 tablets by mouth daily.       . ciprofloxacin (CIPRO) 250 MG tablet Take 1 tablet (250 mg total) by mouth 2 (two) times daily.  14 tablet  0  . citalopram (CELEXA) 40 MG tablet Take 1 tablet (40 mg total) by mouth daily.  90 tablet  1  . DULoxetine (CYMBALTA) 60 MG capsule Take 1 capsule (60 mg total) by mouth 2 (two) times daily.  180 capsule  1  . ezetimibe-simvastatin (VYTORIN) 10-40 MG per tablet Take 1 tablet by mouth at bedtime.  90 tablet  1  . gabapentin (NEURONTIN) 300 MG capsule Take 2 capsules (600 mg total) by mouth 2 (two) times daily.  360 capsule  1  . glucose blood test strip Check in qam and as needed  100 each  3  . isosorbide mononitrate (IMDUR) 60 MG 24 hr tablet Take 1 tablet (60 mg total) by mouth daily.  90 tablet  1  . levothyroxine (SYNTHROID, LEVOTHROID) 25 MCG tablet Take 1 tablet (25 mcg total) by mouth daily.  90 tablet  1  . metFORMIN (GLUCOPHAGE) 1000 MG tablet Take 1 tablet (1,000 mg total) by mouth 2 (two) times daily with a meal.  180 tablet  1  . ONE TOUCH LANCETS MISC Use every am and as needed  200 each  3  . pantoprazole (PROTONIX) 40 MG tablet Take 1 tablet (40 mg total) by mouth daily.  90 tablet  1  . Probiotic Product (PROBIOTIC FORMULA PO) Take by mouth.        . sucralfate (CARAFATE) 1 G tablet Take 1 tablet (1 g total) by mouth as needed.  90 tablet  1  . clopidogrel (PLAVIX) 75 MG tablet Take 1 tablet (75 mg total) by mouth daily.  90 tablet  1  . scopolamine (TRANSDERM-SCOP) 1.5 MG Place 1 patch (1.5 mg total) onto the skin every 3 (three) days.  4 patch  0    Allergies  Allergen Reactions  . Percocet (Oxycodone-Acetaminophen) Itching    Review of Systems  Review of Systems  Constitutional: Negative for fever and  malaise/fatigue.  HENT: Negative for congestion.   Eyes: Negative for discharge.  Respiratory: Positive for shortness of breath. Negative for wheezing.   Cardiovascular: Negative for chest pain, palpitations and leg swelling.  Gastrointestinal: Negative for nausea, abdominal pain and diarrhea.  Genitourinary: Negative for dysuria.  Musculoskeletal: Negative for falls.  Skin: Negative for rash.  Neurological: Negative for loss of consciousness and headaches.  Endo/Heme/Allergies: Negative for polydipsia.  Psychiatric/Behavioral: Negative for depression and suicidal ideas. The patient is not nervous/anxious and does not have insomnia.     Objective  BP 107/72  Pulse 83  Temp 98.1 F (36.7 C) (Oral)  Resp 18  Wt 262 lb 1.9 oz (118.897 kg)  SpO2 90%  Physical Exam  Physical Exam  Constitutional: She is oriented to person, place, and time and well-developed, well-nourished, and in no distress. No distress.  HENT:  Head: Normocephalic and atraumatic.  Eyes: Conjunctivae are normal.  Neck: Neck supple. No thyromegaly present.  Cardiovascular: Normal rate, regular rhythm and normal heart sounds.   No murmur heard. Pulmonary/Chest: Effort normal and breath sounds normal. She has no wheezes.  Abdominal: She exhibits no distension and no mass.  Musculoskeletal: She exhibits no edema.  Lymphadenopathy:    She has no cervical adenopathy.  Neurological: She is alert and oriented to person, place, and time.  Skin: Skin is warm and dry. No rash noted. She is not diaphoretic.  Psychiatric: Memory, affect and judgment normal.    Lab Results  Component Value Date   TSH 1.89 10/06/2011   Lab Results  Component Value Date   WBC 9.0 10/06/2011   HGB 12.1 10/06/2011   HCT 36.6 10/06/2011   MCV 93.2 10/06/2011   PLT 215.0 10/06/2011   Lab Results  Component Value Date   CREATININE 0.8 10/06/2011   BUN 22 10/06/2011   NA 140 10/06/2011   K 4.7 10/06/2011   CL 102 10/06/2011   CO2 30 10/06/2011   Lab  Results  Component Value Date   ALT 21 03/19/2011   AST 23 03/19/2011   ALKPHOS 75 03/19/2011   BILITOT 0.4 03/19/2011   Lab Results  Component Value Date   CHOL 142 03/19/2011   Lab Results  Component Value Date   HDL 34.90* 03/19/2011   Lab Results  Component Value Date   LDLCALC 70 03/19/2011   Lab Results  Component Value Date   TRIG 187.0* 03/19/2011   Lab Results  Component Value Date   CHOLHDL 4 03/19/2011     Assessment & Plan  DM hgba1c 6.9 at last check, reinforced need for decreased simple carbs, increased exercise and recheck labs prior to next visit.  Hot flashes Only occurs once daily after physically exerting herself with cleaning, if she rests it improves. Lately she has begun to stretch her chores out and her symptoms have lessened. She in encouraged to try and use O2 which she already has when she feels this way and see if it helps  Hypoxia Encouraged her to use her O2 as needed during the day and qhs  ESSENTIAL HYPERTENSION, BENIGN Adequately controlled at this time  HYPERLIPIDEMIA Check lipids today, cont current meds for now  ANXIETY DEPRESSION Adequately controlled despite her daughter's family recently moving in with them

## 2011-11-11 NOTE — Assessment & Plan Note (Signed)
Only occurs once daily after physically exerting herself with cleaning, if she rests it improves. Lately she has begun to stretch her chores out and her symptoms have lessened. She in encouraged to try and use O2 which she already has when she feels this way and see if it helps

## 2011-11-11 NOTE — Assessment & Plan Note (Signed)
Adequately controlled at this time.

## 2011-11-11 NOTE — Assessment & Plan Note (Signed)
hgba1c 6.9 at last check, reinforced need for decreased simple carbs, increased exercise and recheck labs prior to next visit.

## 2011-11-20 ENCOUNTER — Telehealth: Payer: Self-pay | Admitting: Family Medicine

## 2011-11-20 NOTE — Telephone Encounter (Signed)
Patient's husband called he said can Cecia take a higher dose of lexapro or does she need to change her med? He said it was mentioned in her last OV. He also said this can wait until Dr Abner Greenspan returns back to the office on 11/24/11.

## 2011-11-21 NOTE — Telephone Encounter (Signed)
Please advise 

## 2011-11-24 MED ORDER — ESCITALOPRAM OXALATE 20 MG PO TABS: 20.0000 mg | ORAL_TABLET | Freq: Every day | ORAL | Status: DC

## 2011-11-24 NOTE — Telephone Encounter (Signed)
Have her stop Celexa 40 mg and start Lexapro 20 mg daily the next day. These are roughly equivalent doses but she might do better on Lexapro. Come in in 4-6 weeks to assess improvement or consider further changes. Disp # 30 2 rf

## 2011-11-24 NOTE — Telephone Encounter (Signed)
pts husband informed and RX sent to pharmacy

## 2011-12-01 ENCOUNTER — Telehealth: Payer: Self-pay | Admitting: *Deleted

## 2011-12-01 MED ORDER — ESCITALOPRAM OXALATE 20 MG PO TABS: 40.0000 mg | ORAL_TABLET | Freq: Every day | ORAL | Status: DC

## 2011-12-01 NOTE — Telephone Encounter (Signed)
Pt husband notified.  Lexapro 20-2 QD sent to [facility] per his request.  No 40 mg dose.

## 2011-12-01 NOTE — Telephone Encounter (Signed)
Pt's husband requesting "compressor unit" Oxygen for pt to have when they are traveling/[place] Please advise.

## 2011-12-01 NOTE — Telephone Encounter (Signed)
So please figure out which company she uses for her O2 then we can call them and find out what they have available for O2 when traveling

## 2011-12-01 NOTE — Addendum Note (Signed)
Addended by: Luisa Dago on: 12/01/2011 05:17 PM   Modules accepted: Orders

## 2011-12-01 NOTE — Telephone Encounter (Signed)
This medication refill was sent to me in error and the offices are aware of this error. CLS

## 2011-12-01 NOTE — Telephone Encounter (Signed)
Patient picked up Rx for 20 mg, is taking 2 tablets a day. Is doing very well. The Rx was written for 1 tablet a a day. Can patient get 40 mg tablets?

## 2011-12-02 NOTE — Telephone Encounter (Signed)
I spoke with Thayer Ohm (Engineer, site) and he states that Mr Schwinn spoke with TJ on Friday 11-28-11, and TJ suggested that pt call back the travel dept and ask for portable travel concentrator instead of a bunch of tanks. Thayer Ohm is going to double check on a couple of things and call me back.

## 2011-12-02 NOTE — Telephone Encounter (Signed)
Per pts spouse [facility], [phone]

## 2011-12-03 NOTE — Telephone Encounter (Signed)
Thayer Ohm left a message stating that he would contact Mr [place]wood. Thayer Ohm stated that it was a misunderstanding on there end and will get O2 to the patient

## 2011-12-17 ENCOUNTER — Other Ambulatory Visit: Payer: Self-pay

## 2011-12-17 DIAGNOSIS — E119 Type 2 diabetes mellitus without complications: Secondary | ICD-10-CM

## 2011-12-17 MED ORDER — PANTOPRAZOLE SODIUM 40 MG PO TBEC: 40.0000 mg | DELAYED_RELEASE_TABLET | Freq: Every day | ORAL | Status: DC

## 2011-12-17 MED ORDER — METFORMIN HCL 1000 MG PO TABS: 1000.0000 mg | ORAL_TABLET | Freq: Two times a day (BID) | ORAL | Status: DC

## 2012-01-12 ENCOUNTER — Other Ambulatory Visit: Payer: Self-pay

## 2012-01-12 MED ORDER — CLOPIDOGREL BISULFATE 75 MG PO TABS: 75.0000 mg | ORAL_TABLET | Freq: Every day | ORAL | Status: DC

## 2012-01-13 ENCOUNTER — Ambulatory Visit (INDEPENDENT_AMBULATORY_CARE_PROVIDER_SITE_OTHER): Payer: Medicare Other | Admitting: Family Medicine

## 2012-01-13 ENCOUNTER — Encounter: Payer: Self-pay | Admitting: Family Medicine

## 2012-01-13 VITALS — BP 130/82 | HR 86 | Temp 97.5°F | Ht 61.0 in | Wt 264.0 lb

## 2012-01-13 DIAGNOSIS — E119 Type 2 diabetes mellitus without complications: Secondary | ICD-10-CM

## 2012-01-13 DIAGNOSIS — IMO0001 Reserved for inherently not codable concepts without codable children: Secondary | ICD-10-CM

## 2012-01-13 DIAGNOSIS — N951 Menopausal and female climacteric states: Secondary | ICD-10-CM

## 2012-01-13 DIAGNOSIS — R232 Flushing: Secondary | ICD-10-CM

## 2012-01-13 DIAGNOSIS — I10 Essential (primary) hypertension: Secondary | ICD-10-CM

## 2012-01-13 LAB — CBC
HCT: 37.1 % (ref 36.0–46.0)
Hemoglobin: 12.1 g/dL (ref 12.0–15.0)
MCHC: 32.7 g/dL (ref 30.0–36.0)
RDW: 14.7 % — ABNORMAL HIGH (ref 11.5–14.6)

## 2012-01-13 LAB — HEPATIC FUNCTION PANEL
ALT: 20 U/L (ref 0–35)
Albumin: 3.7 g/dL (ref 3.5–5.2)
Total Protein: 6.9 g/dL (ref 6.0–8.3)

## 2012-01-13 LAB — RENAL FUNCTION PANEL
BUN: 19 mg/dL (ref 6–23)
Chloride: 105 mEq/L (ref 96–112)
GFR: 79.26 mL/min (ref >60.00)
Glucose, Bld: 137 mg/dL — ABNORMAL HIGH (ref 70–99)
Phosphorus: 3 mg/dL (ref 2.3–4.6)
Potassium: 4.6 mEq/L (ref 3.5–5.1)

## 2012-01-13 NOTE — Assessment & Plan Note (Signed)
Recently diagnosed with fibromyalgia by Dr Modesto Charon at [facility] orthopaedics and switched from Cymbalta to [facility] and has had a good response, reports her chronic pain is notably better

## 2012-01-13 NOTE — Assessment & Plan Note (Signed)
Adequately controlled at today's visit no changes  

## 2012-01-13 NOTE — Assessment & Plan Note (Signed)
Improved since using O2 more routinely

## 2012-01-13 NOTE — Progress Notes (Signed)
Patient ID: Amber [name], female   DOB: 01/29/1946, 66 y.o.   MRN: [id number] ELLASYN SWILLING [id number] [birth year] 01/13/2012      Progress Note-Follow Up  Subjective  Chief Complaint  Chief Complaint  Patient presents with  . Diabetes    HPI  Patient is a 66 year old Caucasian female who is in today accompanied by her husband. They are noting for pathologic her blood sugars have been trending up. She seen a low of 190 and a high of 270. They deny any changes in diet. They deny any signs of systemic illness such as congestion, cough, GI or GU complaints. No fevers or malaise. They note that a week ago she started Cipro after stopping Cymbalta. They did this at the suggestion of Dr Modesto Charon for fibromyalgia and she is pleased with the improvement in her chronic pain. No CP, palp, SOB  Past Medical History  Diagnosis Date  . Thyroid disease   . Hyperlipidemia   . Hypertension     5 stents  . Arthritis   . Depression   . Diabetes mellitus     type 2  . Hypoxia 10/07/2010  . Skin lesion of back 10/18/2010  . Viral infection characterized by skin and mucous membrane lesions 10/18/2010  . Vitamin d deficiency 11/21/2010  . Back pain 11/21/2010  . Motion sickness 12/04/2010  . UTI (lower urinary tract infection) 09/08/2011  . Hot flashes 10/06/2011    Past Surgical History  Procedure Date  . Angioplasty 1980  . Angioplasty 2006    3 stents  . Angioplasty 2009    2 stents  . Cholecystectomy   . Thyroidectomy 1950    for goiter  . Abdominal hysterectomy 1977    partial  . Diverticulitis 1978    exploratory  . Right rotator cuff repair 2004  . Left rotator cuff repair 2004  . Bone fusion left foot 2010    7 screws in foot, bunionectomy  . Hip surgery     large bone spur removed, right  . Back surgery 2004    lumbar discectomy for bulging disc    Family History  Problem Relation Age of Onset  . Heart disease Father   . Diabetes Sister   . Hypertension Sister   .  Depression Sister   . Cancer Sister     colon cancer s/p colectomy  . Emphysema Brother     smoker  . Diabetes Daughter   . Hypertension Daughter   . Depression Daughter   . Diverticulitis Daughter   . Other Daughter     Trichotrilomania  . Aneurysm Maternal Grandmother     brain aneurysm  . Diabetes Paternal Grandmother   . Hypertension Paternal Grandmother   . Cancer Paternal Grandmother     colon  . Crohn's disease Paternal Grandfather   . Emphysema Brother     smoker  . Heart disease Brother   . Diabetes Brother   . Cancer Brother     thyroid  . Coronary artery disease Brother     s/p MI's and 7 stents  . Diabetes Brother   . Other Brother     back disease w/ bulging discs  . Colon polyps Brother     esophageal and tongue polyps  . Diabetes Sister   . Other Sister     short term memory loss  . Aneurysm Sister     brain aneursm s/p coiling  . Diabetes Son   . Anxiety disorder Son  History   Social History  . Marital Status: Married    Spouse Name: N/A    Number of Children: N/A  . Years of Education: N/A   Occupational History  . retired Geologist, engineering    Social History Main Topics  . Smoking status: Never Smoker   . Smokeless tobacco: Never Used  . Alcohol Use: No  . Drug Use: No  . Sexually Active: Yes -- Female partner(s)   Other Topics Concern  . Not on file   Social History Narrative  . No narrative on file    Current Outpatient Prescriptions on File Prior to Visit  Medication Sig Dispense Refill  . amLODipine-benazepril (LOTREL) 5-20 MG per capsule Take 1 capsule by mouth daily.  90 capsule  1  . aspirin 81 MG tablet Take 81 mg by mouth 2 (two) times daily.       . Blood Glucose Monitoring Suppl (ONE TOUCH ULTRA MINI) W/DEVICE KIT Check BS qam and as needed  1 each  3  . carvedilol (COREG) 12.5 MG tablet Take 0.5 tablets (6.25 mg total) by mouth 2 (two) times daily.  180 tablet  1  . Cholecalciferol (VITAMIN D-3 PO) Take 2 tablets by  mouth daily.       . clopidogrel (PLAVIX) 75 MG tablet Take 1 tablet (75 mg total) by mouth daily.  90 tablet  2  . escitalopram (LEXAPRO) 20 MG tablet Take 2 tablets (40 mg total) by mouth daily.  60 tablet  2  . ezetimibe-simvastatin (VYTORIN) 10-40 MG per tablet Take 1 tablet by mouth at bedtime.  90 tablet  1  . gabapentin (NEURONTIN) 300 MG capsule Take 2 capsules (600 mg total) by mouth 2 (two) times daily.  360 capsule  1  . glucose blood test strip Check in qam and as needed  100 each  3  . isosorbide mononitrate (IMDUR) 60 MG 24 hr tablet Take 1 tablet (60 mg total) by mouth daily.  90 tablet  1  . levothyroxine (SYNTHROID, LEVOTHROID) 25 MCG tablet Take 1 tablet (25 mcg total) by mouth daily.  90 tablet  1  . metFORMIN (GLUCOPHAGE) 1000 MG tablet Take 1 tablet (1,000 mg total) by mouth 2 (two) times daily with a meal.  180 tablet  2  . ONE TOUCH LANCETS MISC Use every am and as needed  200 each  3  . pantoprazole (PROTONIX) 40 MG tablet Take 1 tablet (40 mg total) by mouth daily.  90 tablet  2  . Probiotic Product (PROBIOTIC FORMULA PO) Take by mouth.        . sucralfate (CARAFATE) 1 G tablet Take 1 tablet (1 g total) by mouth as needed.  90 tablet  1    Allergies  Allergen Reactions  . Percocet (Oxycodone-Acetaminophen) Itching    Review of Systems  Review of Systems  Constitutional: Negative for fever and malaise/fatigue.  HENT: Negative for congestion.   Eyes: Negative for discharge.  Respiratory: Negative for shortness of breath.   Cardiovascular: Negative for chest pain, palpitations and leg swelling.  Gastrointestinal: Negative for nausea, abdominal pain and diarrhea.  Genitourinary: Negative for dysuria.  Musculoskeletal: Negative for falls.  Skin: Negative for rash.  Neurological: Negative for loss of consciousness and headaches.  Endo/Heme/Allergies: Negative for polydipsia.  Psychiatric/Behavioral: Negative for depression and suicidal ideas. The patient is not  nervous/anxious and does not have insomnia.     Objective  BP 130/82  Pulse 86  Temp 97.5 F (36.4  C) (Temporal)  Ht 5\' 1"  (1.549 m)  Wt 264 lb (119.75 kg)  BMI 49.88 kg/m2  SpO2 92%  Physical Exam  Physical Exam  Constitutional: She is oriented to person, place, and time and well-developed, well-nourished, and in no distress. No distress.  HENT:  Head: Normocephalic and atraumatic.  Eyes: Conjunctivae are normal.  Neck: Neck supple. No thyromegaly present.  Cardiovascular: Normal rate, regular rhythm and normal heart sounds.   No murmur heard. Pulmonary/Chest: Effort normal and breath sounds normal. She has no wheezes.  Abdominal: She exhibits no distension and no mass.  Musculoskeletal: She exhibits no edema.  Lymphadenopathy:    She has no cervical adenopathy.  Neurological: She is alert and oriented to person, place, and time.  Skin: Skin is warm and dry. No rash noted. She is not diaphoretic.  Psychiatric: Memory, affect and judgment normal.    Lab Results  Component Value Date   TSH 1.89 10/06/2011   Lab Results  Component Value Date   WBC 9.0 10/06/2011   HGB 12.1 10/06/2011   HCT 36.6 10/06/2011   MCV 93.2 10/06/2011   PLT 215.0 10/06/2011   Lab Results  Component Value Date   CREATININE 0.8 10/06/2011   BUN 22 10/06/2011   NA 140 10/06/2011   K 4.7 10/06/2011   CL 102 10/06/2011   CO2 30 10/06/2011   Lab Results  Component Value Date   ALT 21 03/19/2011   AST 23 03/19/2011   ALKPHOS 75 03/19/2011   BILITOT 0.4 03/19/2011   Lab Results  Component Value Date   CHOL 149 11/11/2011   Lab Results  Component Value Date   HDL 46.20 11/11/2011   Lab Results  Component Value Date   LDLCALC 78 11/11/2011   Lab Results  Component Value Date   TRIG 125.0 11/11/2011   Lab Results  Component Value Date   CHOLHDL 3 11/11/2011     Assessment & Plan  UNSPECIFIED MYALGIA AND MYOSITIS Recently diagnosed with fibromyalgia by Dr Modesto Charon at [facility] orthopaedics and switched from  Cymbalta to [facility] and has had a good response, reports her chronic pain is notably better  ESSENTIAL HYPERTENSION, BENIGN Adequately controlled at today's visit no changes  Hot flashes Improved since using O2 more routinely    DM They deny any changes in diet or signs of acute illness and have noted an increase in her blood sugars in past week. Numbers ranging from 190 to 270. Will check hgba1c today and will consider adding further meds as indicated

## 2012-01-13 NOTE — Assessment & Plan Note (Signed)
They deny any changes in diet or signs of acute illness and have noted an increase in her blood sugars in past week. Numbers ranging from 190 to 270. Will check hgba1c today and will consider adding further meds as indicated

## 2012-01-19 ENCOUNTER — Encounter: Payer: Self-pay | Admitting: Family Medicine

## 2012-01-19 ENCOUNTER — Ambulatory Visit (INDEPENDENT_AMBULATORY_CARE_PROVIDER_SITE_OTHER): Payer: Medicare Other | Admitting: Family Medicine

## 2012-01-19 VITALS — BP 113/80 | HR 94 | Temp 97.2°F | Ht 61.0 in | Wt 263.1 lb

## 2012-01-19 DIAGNOSIS — R319 Hematuria, unspecified: Secondary | ICD-10-CM

## 2012-01-19 DIAGNOSIS — E119 Type 2 diabetes mellitus without complications: Secondary | ICD-10-CM

## 2012-01-19 DIAGNOSIS — I10 Essential (primary) hypertension: Secondary | ICD-10-CM

## 2012-01-19 DIAGNOSIS — B379 Candidiasis, unspecified: Secondary | ICD-10-CM

## 2012-01-19 DIAGNOSIS — N390 Urinary tract infection, site not specified: Secondary | ICD-10-CM

## 2012-01-19 DIAGNOSIS — R350 Frequency of micturition: Secondary | ICD-10-CM

## 2012-01-19 LAB — POCT URINALYSIS DIPSTICK
Glucose, UA: NEGATIVE
Nitrite, UA: NEGATIVE
Protein, UA: 30
Urobilinogen, UA: 0.2

## 2012-01-19 MED ORDER — ALOGLIPTIN BENZOATE 25 MG PO TABS: 25.0000 mg | ORAL_TABLET | Freq: Every day | ORAL | Status: DC

## 2012-01-19 MED ORDER — FLUCONAZOLE 150 MG PO TABS: ORAL_TABLET | ORAL | Status: DC

## 2012-01-19 MED ORDER — AMOXICILLIN-POT CLAVULANATE 875-125 MG PO TABS: 1.0000 | ORAL_TABLET | Freq: Two times a day (BID) | ORAL | Status: DC

## 2012-01-19 NOTE — Assessment & Plan Note (Addendum)
Sugars remain elevated in the hi 100s and low 200s since adding Nesina. Is given one packet of Kazana 12.09/998 to take bid after the Suzzette Righter is out and it will replace the Nesina and Metformin, she should also minmize simple carbs. May need to change again if nymbers do not improve.

## 2012-01-19 NOTE — Assessment & Plan Note (Signed)
Encouraged to start cranberry tabs, increase hydration. Given Augmentin bid and continue probiotic, urine sent for culture. Given a paper prescription for Diflucan to use prn if any concerning symptoms, ie discharge, itching, while they are on their short trip.

## 2012-01-19 NOTE — Assessment & Plan Note (Signed)
Well controlled on current meds, no changes 

## 2012-01-19 NOTE — Progress Notes (Signed)
Patient ID: Amber [name], female   DOB: 02-09-1946, 66 y.o.   MRN: [id number] Amber [name] [id number] 1946-02-19 01/19/2012      Progress Note-Follow Up  Subjective  Chief Complaint  Chief Complaint  Patient presents with  . Vaginitis    urine frequency X 3 days    HPI  Patient is a 66 year old Caucasian female who is in today complaining of 2 days worth of urinary urgency and frequency. She feels the urge to go when she goes she is only able to pass small amount of urine. She denies any dysuria or hematuria. No fevers or chills. No increased malaise, myalgias, fatigue, abdominal or back pain. Her bowels are moving normal. They're leaving on a three-day vacation and she was concerned about her symptoms before hitting the road. They report also that her blood sugars have not improved greatly since adding new diabetic medication. They bring a log which shows a low of 150 and a high of 246 in the last week. Denies chest pain, palpitations, shortness of breath.  Past Medical History  Diagnosis Date  . Thyroid disease   . Hyperlipidemia   . Hypertension     5 stents  . Arthritis   . Depression   . Diabetes mellitus     type 2  . Hypoxia 10/07/2010  . Skin lesion of back 10/18/2010  . Viral infection characterized by skin and mucous membrane lesions 10/18/2010  . Vitamin d deficiency 11/21/2010  . Back pain 11/21/2010  . Motion sickness 12/04/2010  . UTI (lower urinary tract infection) 09/08/2011  . Hot flashes 10/06/2011    Past Surgical History  Procedure Date  . Angioplasty 1980  . Angioplasty 2006    3 stents  . Angioplasty 2009    2 stents  . Cholecystectomy   . Thyroidectomy 1950    for goiter  . Abdominal hysterectomy 1977    partial  . Diverticulitis 1978    exploratory  . Right rotator cuff repair 2004  . Left rotator cuff repair 2004  . Bone fusion left foot 2010    7 screws in foot, bunionectomy  . Hip surgery     large bone spur removed, right  . Back  surgery 2004    lumbar discectomy for bulging disc    Family History  Problem Relation Age of Onset  . Heart disease Father   . Diabetes Sister   . Hypertension Sister   . Depression Sister   . Cancer Sister     colon cancer s/p colectomy  . Emphysema Brother     smoker  . Diabetes Daughter   . Hypertension Daughter   . Depression Daughter   . Diverticulitis Daughter   . Other Daughter     Trichotrilomania  . Aneurysm Maternal Grandmother     brain aneurysm  . Diabetes Paternal Grandmother   . Hypertension Paternal Grandmother   . Cancer Paternal Grandmother     colon  . Crohn's disease Paternal Grandfather   . Emphysema Brother     smoker  . Heart disease Brother   . Diabetes Brother   . Cancer Brother     thyroid  . Coronary artery disease Brother     s/p MI's and 7 stents  . Diabetes Brother   . Other Brother     back disease w/ bulging discs  . Colon polyps Brother     esophageal and tongue polyps  . Diabetes Sister   . Other Sister  short term memory loss  . Aneurysm Sister     brain aneursm s/p coiling  . Diabetes Son   . Anxiety disorder Son     History   Social History  . Marital Status: Married    Spouse Name: N/A    Number of Children: N/A  . Years of Education: N/A   Occupational History  . retired Geologist, engineering    Social History Main Topics  . Smoking status: Never Smoker   . Smokeless tobacco: Never Used  . Alcohol Use: No  . Drug Use: No  . Sexually Active: Yes -- Female partner(s)   Other Topics Concern  . Not on file   Social History Narrative  . No narrative on file    Current Outpatient Prescriptions on File Prior to Visit  Medication Sig Dispense Refill  . amLODipine-benazepril (LOTREL) 5-20 MG per capsule Take 1 capsule by mouth daily.  90 capsule  1  . aspirin 81 MG tablet Take 81 mg by mouth 2 (two) times daily.       . Blood Glucose Monitoring Suppl (ONE TOUCH ULTRA MINI) W/DEVICE KIT Check BS qam and as needed   1 each  3  . carvedilol (COREG) 12.5 MG tablet Take 0.5 tablets (6.25 mg total) by mouth 2 (two) times daily.  180 tablet  1  . Cholecalciferol (VITAMIN D-3 PO) Take 2 tablets by mouth daily.       . clopidogrel (PLAVIX) 75 MG tablet Take 1 tablet (75 mg total) by mouth daily.  90 tablet  2  . escitalopram (LEXAPRO) 20 MG tablet Take 2 tablets (40 mg total) by mouth daily.  60 tablet  2  . ezetimibe-simvastatin (VYTORIN) 10-40 MG per tablet Take 1 tablet by mouth at bedtime.  90 tablet  1  . gabapentin (NEURONTIN) 300 MG capsule Take 2 capsules (600 mg total) by mouth 2 (two) times daily.  360 capsule  1  . glucose blood test strip Check in qam and as needed  100 each  3  . isosorbide mononitrate (IMDUR) 60 MG 24 hr tablet Take 1 tablet (60 mg total) by mouth daily.  90 tablet  1  . levothyroxine (SYNTHROID, LEVOTHROID) 25 MCG tablet Take 1 tablet (25 mcg total) by mouth daily.  90 tablet  1  . metFORMIN (GLUCOPHAGE) 1000 MG tablet Take 1 tablet (1,000 mg total) by mouth 2 (two) times daily with a meal.  180 tablet  2  . Milnacipran (SAVELLA) 50 MG TABS Take 50 mg by mouth 2 (two) times daily.      . ONE TOUCH LANCETS MISC Use every am and as needed  200 each  3  . pantoprazole (PROTONIX) 40 MG tablet Take 1 tablet (40 mg total) by mouth daily.  90 tablet  2  . Probiotic Product (PROBIOTIC FORMULA PO) Take by mouth.        . sucralfate (CARAFATE) 1 G tablet Take 1 tablet (1 g total) by mouth as needed.  90 tablet  1  . Alogliptin Benzoate (NESINA) 25 MG TABS Take 25 mg by mouth daily.  14 tablet  0    Allergies  Allergen Reactions  . Percocet (Oxycodone-Acetaminophen) Itching    Review of Systems  Review of Systems  Constitutional: Negative for fever and malaise/fatigue.  HENT: Negative for congestion.   Eyes: Negative for discharge.  Respiratory: Negative for shortness of breath.   Cardiovascular: Negative for chest pain, palpitations and leg swelling.  Gastrointestinal: Negative for  nausea, abdominal pain and diarrhea.  Genitourinary: Positive for urgency and frequency. Negative for dysuria, hematuria and flank pain.  Musculoskeletal: Negative for falls.  Skin: Negative for rash.  Neurological: Negative for loss of consciousness and headaches.  Endo/Heme/Allergies: Negative for polydipsia.  Psychiatric/Behavioral: Negative for depression and suicidal ideas. The patient is not nervous/anxious and does not have insomnia.     Objective  BP 113/80  Pulse 94  Temp 97.2 F (36.2 C) (Temporal)  Ht 5\' 1"  (1.549 m)  Wt 263 lb 1.9 oz (119.35 kg)  BMI 49.72 kg/m2  SpO2 91%  Physical Exam  Physical Exam  Constitutional: She is oriented to person, place, and time and well-developed, well-nourished, and in no distress. No distress.  HENT:  Head: Normocephalic and atraumatic.  Eyes: Conjunctivae normal are normal.  Neck: Neck supple. No thyromegaly present.  Cardiovascular: Normal rate, regular rhythm and normal heart sounds.   No murmur heard. Pulmonary/Chest: Effort normal and breath sounds normal. She has no wheezes.  Abdominal: She exhibits no distension and no mass.  Musculoskeletal: She exhibits no edema.  Lymphadenopathy:    She has no cervical adenopathy.  Neurological: She is alert and oriented to person, place, and time.  Skin: Skin is warm and dry. No rash noted. She is not diaphoretic.  Psychiatric: Memory, affect and judgment normal.    Lab Results  Component Value Date   TSH 1.89 10/06/2011   Lab Results  Component Value Date   WBC 8.1 01/13/2012   HGB 12.1 01/13/2012   HCT 37.1 01/13/2012   MCV 92.9 01/13/2012   PLT 230.0 01/13/2012   Lab Results  Component Value Date   CREATININE 0.8 01/13/2012   BUN 19 01/13/2012   NA 141 01/13/2012   K 4.6 01/13/2012   CL 105 01/13/2012   CO2 30 01/13/2012   Lab Results  Component Value Date   ALT 20 01/13/2012   AST 20 01/13/2012   ALKPHOS 93 01/13/2012   BILITOT 0.1* 01/13/2012   Lab Results  Component  Value Date   CHOL 149 11/11/2011   Lab Results  Component Value Date   HDL 46.20 11/11/2011   Lab Results  Component Value Date   LDLCALC 78 11/11/2011   Lab Results  Component Value Date   TRIG 125.0 11/11/2011   Lab Results  Component Value Date   CHOLHDL 3 11/11/2011     Assessment & Plan  UTI (lower urinary tract infection) Encouraged to start cranberry tabs, increase hydration. Given Augmentin bid and continue probiotic, urine sent for culture. Given a paper prescription for Diflucan to use prn if any concerning symptoms, ie discharge, itching, while they are on their short trip.   DM Sugars remain elevated in the hi 100s and low 200s since adding Nesina. Is given one packet of Kazana 12.09/998 to take bid after the Suzzette Righter is out and it will replace the Nesina and Metformin, she should also minmize simple carbs. May need to change again if nymbers do not improve.  ESSENTIAL HYPERTENSION, BENIGN Well controlled on current meds, no changes

## 2012-01-19 NOTE — Patient Instructions (Addendum)

## 2012-01-21 LAB — URINE CULTURE

## 2012-01-22 NOTE — Progress Notes (Signed)
Quick Note:  Patient Informed and voiced understanding ______ 

## 2012-01-26 ENCOUNTER — Ambulatory Visit (INDEPENDENT_AMBULATORY_CARE_PROVIDER_SITE_OTHER): Payer: Medicare Other | Admitting: Family Medicine

## 2012-01-26 ENCOUNTER — Encounter: Payer: Self-pay | Admitting: Family Medicine

## 2012-01-26 VITALS — BP 106/67 | HR 88 | Temp 97.8°F | Ht 61.0 in | Wt 262.1 lb

## 2012-01-26 DIAGNOSIS — E039 Hypothyroidism, unspecified: Secondary | ICD-10-CM

## 2012-01-26 DIAGNOSIS — E119 Type 2 diabetes mellitus without complications: Secondary | ICD-10-CM

## 2012-01-26 MED ORDER — LEVOTHYROXINE SODIUM 25 MCG PO TABS: 25.0000 ug | ORAL_TABLET | Freq: Every day | ORAL | Status: DC

## 2012-01-26 MED ORDER — ALOGLIPTIN-METFORMIN HCL 12.5-1000 MG PO TABS: 1.0000 | ORAL_TABLET | Freq: Two times a day (BID) | ORAL | Status: DC

## 2012-01-26 NOTE — Assessment & Plan Note (Addendum)
Patient has seen a slow improvement in her sugar numbers since starting the alogliptin Metformin 12.09/998 1 tab po bid. Avoid simple carbs and they are going to check with the pharmacist to see if this is covered better than the Janumet we will proceed with which ever one has the better coverage.

## 2012-01-26 NOTE — Progress Notes (Signed)
Patient ID: Amber [name], female   DOB: 04/14/1946, 66 y.o.   MRN: [id number]   Amber [name] [id number] [birth year] 01/26/2012      Progress Note-Follow Up  Subjective  Chief Complaint  Chief Complaint  Patient presents with  . Follow-up    1 week     HPI  Patient is a 66 year old Caucasian female who is in today to discuss diabetes. She had a fasting sugar of 117 snoring. The highest number since last week was 175. She has had many numbers in the 140s 150s since last seen. Denies polyuria polydipsia, chest pain, palpitations, shortness of breath, GI or GU complaints. Has not had any nausea or concerns with the medication.  Past Medical History  Diagnosis Date  . Thyroid disease   . Hyperlipidemia   . Hypertension     5 stents  . Arthritis   . Depression   . Diabetes mellitus     type 2  . Hypoxia 10/07/2010  . Skin lesion of back 10/18/2010  . Viral infection characterized by skin and mucous membrane lesions 10/18/2010  . Vitamin d deficiency 11/21/2010  . Back pain 11/21/2010  . Motion sickness 12/04/2010  . UTI (lower urinary tract infection) 09/08/2011  . Hot flashes 10/06/2011    Past Surgical History  Procedure Date  . Angioplasty 1980  . Angioplasty 2006    3 stents  . Angioplasty 2009    2 stents  . Cholecystectomy   . Thyroidectomy 1950    for goiter  . Abdominal hysterectomy 1977    partial  . Diverticulitis 1978    exploratory  . Right rotator cuff repair 2004  . Left rotator cuff repair 2004  . Bone fusion left foot 2010    7 screws in foot, bunionectomy  . Hip surgery     large bone spur removed, right  . Back surgery 2004    lumbar discectomy for bulging disc    Family History  Problem Relation Age of Onset  . Heart disease Father   . Diabetes Sister   . Hypertension Sister   . Depression Sister   . Cancer Sister     colon cancer s/p colectomy  . Emphysema Brother     smoker  . Diabetes Daughter   . Hypertension Daughter   .  Depression Daughter   . Diverticulitis Daughter   . Other Daughter     Trichotrilomania  . Aneurysm Maternal Grandmother     brain aneurysm  . Diabetes Paternal Grandmother   . Hypertension Paternal Grandmother   . Cancer Paternal Grandmother     colon  . Crohn's disease Paternal Grandfather   . Emphysema Brother     smoker  . Heart disease Brother   . Diabetes Brother   . Cancer Brother     thyroid  . Coronary artery disease Brother     s/p MI's and 7 stents  . Diabetes Brother   . Other Brother     back disease w/ bulging discs  . Colon polyps Brother     esophageal and tongue polyps  . Diabetes Sister   . Other Sister     short term memory loss  . Aneurysm Sister     brain aneursm s/p coiling  . Diabetes Son   . Anxiety disorder Son     History   Social History  . Marital Status: Married    Spouse Name: N/A    Number of Children: N/A  . Years  of Education: N/A   Occupational History  . retired Geologist, engineering    Social History Main Topics  . Smoking status: Never Smoker   . Smokeless tobacco: Never Used  . Alcohol Use: No  . Drug Use: No  . Sexually Active: Yes -- Female partner(s)   Other Topics Concern  . Not on file   Social History Narrative  . No narrative on file    Current Outpatient Prescriptions on File Prior to Visit  Medication Sig Dispense Refill  . amLODipine-benazepril (LOTREL) 5-20 MG per capsule Take 1 capsule by mouth daily.  90 capsule  1  . amoxicillin-clavulanate (AUGMENTIN) 875-125 MG per tablet Take 1 tablet by mouth 2 (two) times daily.  14 tablet  0  . aspirin 81 MG tablet Take 81 mg by mouth 2 (two) times daily.       . Blood Glucose Monitoring Suppl (ONE TOUCH ULTRA MINI) W/DEVICE KIT Check BS qam and as needed  1 each  3  . carvedilol (COREG) 12.5 MG tablet Take 0.5 tablets (6.25 mg total) by mouth 2 (two) times daily.  180 tablet  1  . Cholecalciferol (VITAMIN D-3 PO) Take 2 tablets by mouth daily.       . clopidogrel  (PLAVIX) 75 MG tablet Take 1 tablet (75 mg total) by mouth daily.  90 tablet  2  . escitalopram (LEXAPRO) 20 MG tablet Take 2 tablets (40 mg total) by mouth daily.  60 tablet  2  . ezetimibe-simvastatin (VYTORIN) 10-40 MG per tablet Take 1 tablet by mouth at bedtime.  90 tablet  1  . fluconazole (DIFLUCAN) 150 MG tablet 1 tab po weekly x 2 as directed  2 tablet  0  . gabapentin (NEURONTIN) 300 MG capsule Take 2 capsules (600 mg total) by mouth 2 (two) times daily.  360 capsule  1  . glucose blood test strip Check in qam and as needed  100 each  3  . isosorbide mononitrate (IMDUR) 60 MG 24 hr tablet Take 1 tablet (60 mg total) by mouth daily.  90 tablet  1  . Milnacipran (SAVELLA) 50 MG TABS Take 50 mg by mouth 2 (two) times daily.      . ONE TOUCH LANCETS MISC Use every am and as needed  200 each  3  . pantoprazole (PROTONIX) 40 MG tablet Take 1 tablet (40 mg total) by mouth daily.  90 tablet  2  . Probiotic Product (PROBIOTIC FORMULA PO) Take by mouth.        . sucralfate (CARAFATE) 1 G tablet Take 1 tablet (1 g total) by mouth as needed.  90 tablet  1  . DISCONTD: levothyroxine (SYNTHROID, LEVOTHROID) 25 MCG tablet Take 1 tablet (25 mcg total) by mouth daily.  90 tablet  1  . Alogliptin-Metformin HCl 12.09-998 MG TABS Take 1 tablet by mouth 2 (two) times daily.  42 tablet  0    Allergies  Allergen Reactions  . Percocet (Oxycodone-Acetaminophen) Itching    Review of Systems  Review of Systems  Constitutional: Positive for malaise/fatigue. Negative for fever.  HENT: Negative for congestion.   Eyes: Negative for discharge.  Respiratory: Negative for shortness of breath.   Cardiovascular: Negative for chest pain, palpitations and leg swelling.  Gastrointestinal: Negative for nausea, abdominal pain and diarrhea.  Genitourinary: Negative for dysuria.  Musculoskeletal: Negative for falls.  Skin: Negative for rash.  Neurological: Negative for loss of consciousness and headaches.    Endo/Heme/Allergies: Negative for polydipsia.  Psychiatric/Behavioral: Negative for depression and suicidal ideas. The patient is not nervous/anxious and does not have insomnia.     Objective  BP 106/67  Pulse 88  Temp 97.8 F (36.6 C) (Temporal)  Ht 5\' 1"  (1.549 m)  Wt 262 lb 1.9 oz (118.897 kg)  BMI 49.53 kg/m2  SpO2 93%  Physical Exam  Physical Exam  Constitutional: She is oriented to person, place, and time and well-developed, well-nourished, and in no distress. No distress.       obese  HENT:  Head: Normocephalic and atraumatic.  Eyes: Conjunctivae normal are normal.  Neck: Neck supple. No thyromegaly present.  Cardiovascular: Normal rate, regular rhythm and normal heart sounds.   No murmur heard. Pulmonary/Chest: Effort normal and breath sounds normal. She has no wheezes.  Abdominal: She exhibits no distension and no mass.  Musculoskeletal: She exhibits no edema.  Lymphadenopathy:    She has no cervical adenopathy.  Neurological: She is alert and oriented to person, place, and time.  Skin: Skin is warm and dry. No rash noted. She is not diaphoretic.  Psychiatric: Memory, affect and judgment normal.    Lab Results  Component Value Date   TSH 1.89 10/06/2011   Lab Results  Component Value Date   WBC 8.1 01/13/2012   HGB 12.1 01/13/2012   HCT 37.1 01/13/2012   MCV 92.9 01/13/2012   PLT 230.0 01/13/2012   Lab Results  Component Value Date   CREATININE 0.8 01/13/2012   BUN 19 01/13/2012   NA 141 01/13/2012   K 4.6 01/13/2012   CL 105 01/13/2012   CO2 30 01/13/2012   Lab Results  Component Value Date   ALT 20 01/13/2012   AST 20 01/13/2012   ALKPHOS 93 01/13/2012   BILITOT 0.1* 01/13/2012   Lab Results  Component Value Date   CHOL 149 11/11/2011   Lab Results  Component Value Date   HDL 46.20 11/11/2011   Lab Results  Component Value Date   LDLCALC 78 11/11/2011   Lab Results  Component Value Date   TRIG 125.0 11/11/2011   Lab Results  Component Value Date    CHOLHDL 3 11/11/2011     Assessment & Plan  DM Patient has seen a slow improvement in her sugar numbers since starting the alogliptin Metformin 12.09/998 1 tab po bid. Avoid simple carbs and they are going to check with the pharmacist to see if this is covered better than the Janumet we will proceed with which ever one has the better coverage.

## 2012-02-19 ENCOUNTER — Ambulatory Visit: Payer: Medicare Other

## 2012-02-23 ENCOUNTER — Ambulatory Visit (INDEPENDENT_AMBULATORY_CARE_PROVIDER_SITE_OTHER): Payer: Medicare Other | Admitting: Family Medicine

## 2012-02-23 ENCOUNTER — Other Ambulatory Visit: Payer: Self-pay

## 2012-02-23 ENCOUNTER — Encounter: Payer: Self-pay | Admitting: Family Medicine

## 2012-02-23 VITALS — BP 137/70 | HR 98 | Temp 98.2°F | Ht 61.0 in | Wt 260.4 lb

## 2012-02-23 DIAGNOSIS — I10 Essential (primary) hypertension: Secondary | ICD-10-CM

## 2012-02-23 DIAGNOSIS — J40 Bronchitis, not specified as acute or chronic: Secondary | ICD-10-CM

## 2012-02-23 DIAGNOSIS — E119 Type 2 diabetes mellitus without complications: Secondary | ICD-10-CM

## 2012-02-23 MED ORDER — AZITHROMYCIN 250 MG PO TABS: ORAL_TABLET | ORAL | Status: DC

## 2012-02-23 MED ORDER — HYDROCOD POLST-CHLORPHEN POLST 10-8 MG/5ML PO LQCR: 5.0000 mL | Freq: Two times a day (BID) | ORAL | Status: DC | PRN

## 2012-02-23 MED ORDER — SITAGLIP PHOS-METFORMIN HCL ER 50-1000 MG PO TB24: 1.0000 | ORAL_TABLET | Freq: Two times a day (BID) | ORAL | Status: DC

## 2012-02-23 MED ORDER — METHYLPREDNISOLONE 4 MG PO KIT: PACK | ORAL | Status: DC

## 2012-02-23 MED ORDER — ESCITALOPRAM OXALATE 20 MG PO TABS: 40.0000 mg | ORAL_TABLET | Freq: Every day | ORAL | Status: DC

## 2012-02-23 NOTE — Patient Instructions (Addendum)

## 2012-02-28 ENCOUNTER — Encounter: Payer: Self-pay | Admitting: Family Medicine

## 2012-02-28 DIAGNOSIS — J40 Bronchitis, not specified as acute or chronic: Secondary | ICD-10-CM

## 2012-02-28 HISTORY — DX: Bronchitis, not specified as acute or chronic: J40

## 2012-02-28 NOTE — Assessment & Plan Note (Signed)
Had to change meds to Janumet secondary to insurance coverage. Avoid simple carbs

## 2012-02-28 NOTE — Assessment & Plan Note (Signed)
Adequately controlled today, no changes. 

## 2012-02-28 NOTE — Progress Notes (Signed)
Patient ID: Amber [name], female   DOB: 01/29/1946, 66 y.o.   MRN: [id number] ELAINE RATTERREE [id number] June 20, 1945 02/28/2012      Progress Note-Follow Up  Subjective  Chief Complaint  Chief Complaint  Patient presents with  . Cough    sinus headaches X 3 weeks    HPI  Patient is a 66 year old Caucasian female who is in today with a headache. She's been struggling with increased sinus congestion and some headache for about 3 weeks throat and a dry cough. Has a sense of feeling lightheaded and did have a post tragal episode of syncope that was fleeting recently. The cough is not leading to vomiting but there is some gagging. She denies chest pain, palpitations, shortness of breath. The cough is keeping her up at night frequently. Blood sugars had not spiked tremendously despite her illness.  Past Medical History  Diagnosis Date  . Thyroid disease   . Hyperlipidemia   . Hypertension     5 stents  . Arthritis   . Depression   . Diabetes mellitus     type 2  . Hypoxia 10/07/2010  . Skin lesion of back 10/18/2010  . Viral infection characterized by skin and mucous membrane lesions 10/18/2010  . Vitamin D deficiency 11/21/2010  . Back pain 11/21/2010  . Motion sickness 12/04/2010  . UTI (lower urinary tract infection) 09/08/2011  . Hot flashes 10/06/2011  . Bronchitis 02/28/2012    Past Surgical History  Procedure Date  . Angioplasty 1980  . Angioplasty 2006    3 stents  . Angioplasty 2009    2 stents  . Cholecystectomy   . Thyroidectomy 1950    for goiter  . Abdominal hysterectomy 1977    partial  . Diverticulitis 1978    exploratory  . Right rotator cuff repair 2004  . Left rotator cuff repair 2004  . Bone fusion left foot 2010    7 screws in foot, bunionectomy  . Hip surgery     large bone spur removed, right  . Back surgery 2004    lumbar discectomy for bulging disc    Family History  Problem Relation Age of Onset  . Heart disease Father   . Diabetes  Sister   . Hypertension Sister   . Depression Sister   . Cancer Sister     colon cancer s/p colectomy  . Emphysema Brother     smoker  . Diabetes Daughter   . Hypertension Daughter   . Depression Daughter   . Diverticulitis Daughter   . Other Daughter     Trichotrilomania  . Aneurysm Maternal Grandmother     brain aneurysm  . Diabetes Paternal Grandmother   . Hypertension Paternal Grandmother   . Cancer Paternal Grandmother     colon  . Crohn's disease Paternal Grandfather   . Emphysema Brother     smoker  . Heart disease Brother   . Diabetes Brother   . Cancer Brother     thyroid  . Coronary artery disease Brother     s/p MI's and 7 stents  . Diabetes Brother   . Other Brother     back disease w/ bulging discs  . Colon polyps Brother     esophageal and tongue polyps  . Diabetes Sister   . Other Sister     short term memory loss  . Aneurysm Sister     brain aneursm s/p coiling  . Diabetes Son   . Anxiety disorder Son  History   Social History  . Marital Status: Married    Spouse Name: N/A    Number of Children: N/A  . Years of Education: N/A   Occupational History  . retired Geologist, engineering    Social History Main Topics  . Smoking status: Never Smoker   . Smokeless tobacco: Never Used  . Alcohol Use: No  . Drug Use: No  . Sexually Active: Yes -- Female partner(s)   Other Topics Concern  . Not on file   Social History Narrative  . No narrative on file    Current Outpatient Prescriptions on File Prior to Visit  Medication Sig Dispense Refill  . amLODipine-benazepril (LOTREL) 5-20 MG per capsule Take 1 capsule by mouth daily.  90 capsule  1  . aspirin 81 MG tablet Take 81 mg by mouth 2 (two) times daily.       . Blood Glucose Monitoring Suppl (ONE TOUCH ULTRA MINI) W/DEVICE KIT Check BS qam and as needed  1 each  3  . carvedilol (COREG) 12.5 MG tablet Take 0.5 tablets (6.25 mg total) by mouth 2 (two) times daily.  180 tablet  1  .  Cholecalciferol (VITAMIN D-3 PO) Take 2 tablets by mouth daily.       . clopidogrel (PLAVIX) 75 MG tablet Take 1 tablet (75 mg total) by mouth daily.  90 tablet  2  . ezetimibe-simvastatin (VYTORIN) 10-40 MG per tablet Take 1 tablet by mouth at bedtime.  90 tablet  1  . gabapentin (NEURONTIN) 300 MG capsule Take 2 capsules (600 mg total) by mouth 2 (two) times daily.  360 capsule  1  . glucose blood test strip Check in qam and as needed  100 each  3  . isosorbide mononitrate (IMDUR) 60 MG 24 hr tablet Take 1 tablet (60 mg total) by mouth daily.  90 tablet  1  . levothyroxine (SYNTHROID, LEVOTHROID) 25 MCG tablet Take 1 tablet (25 mcg total) by mouth daily.  90 tablet  2  . Milnacipran (SAVELLA) 50 MG TABS Take 50 mg by mouth 2 (two) times daily.      . ONE TOUCH LANCETS MISC Use every am and as needed  200 each  3  . pantoprazole (PROTONIX) 40 MG tablet Take 1 tablet (40 mg total) by mouth daily.  90 tablet  2  . Probiotic Product (PROBIOTIC FORMULA PO) Take by mouth.        . sucralfate (CARAFATE) 1 G tablet Take 1 tablet (1 g total) by mouth as needed.  90 tablet  1  . fluconazole (DIFLUCAN) 150 MG tablet 1 tab po weekly x 2 as directed  2 tablet  0  . SitaGLIPtin-MetFORMIN HCl (JANUMET XR) 50-1000 MG TB24 Take 1 tablet by mouth 2 (two) times daily.  60 tablet  1    Allergies  Allergen Reactions  . Percocet (Oxycodone-Acetaminophen) Itching    Review of Systems  Review of Systems  Constitutional: Positive for malaise/fatigue. Negative for fever.  HENT: Positive for congestion.   Eyes: Negative for discharge.  Respiratory: Positive for cough. Negative for shortness of breath.   Cardiovascular: Negative for chest pain, palpitations and leg swelling.  Gastrointestinal: Negative for nausea, abdominal pain and diarrhea.  Genitourinary: Negative for dysuria.  Musculoskeletal: Negative for falls.  Skin: Negative for rash.  Neurological: Positive for dizziness and headaches. Negative for  loss of consciousness.  Endo/Heme/Allergies: Negative for polydipsia.  Psychiatric/Behavioral: Negative for depression and suicidal ideas. The patient is not  nervous/anxious and does not have insomnia.     Objective  BP 137/70  Pulse 98  Temp 98.2 F (36.8 C) (Temporal)  Ht 5\' 1"  (1.549 m)  Wt 260 lb 6.4 oz (118.117 kg)  BMI 49.20 kg/m2  SpO2 88%  Physical Exam  Physical Exam  Constitutional: She is oriented to person, place, and time and well-developed, well-nourished, and in no distress. No distress.  HENT:  Head: Normocephalic and atraumatic.  Eyes: Conjunctivae normal are normal.  Neck: Neck supple. No thyromegaly present.  Cardiovascular: Normal rate, regular rhythm and normal heart sounds.   No murmur heard. Pulmonary/Chest: Effort normal. She has no wheezes. She has rales.       Slight rales b/l bases  Abdominal: She exhibits no distension and no mass.  Musculoskeletal: She exhibits no edema.  Lymphadenopathy:    She has no cervical adenopathy.  Neurological: She is alert and oriented to person, place, and time.  Skin: Skin is warm and dry. No rash noted. She is not diaphoretic.  Psychiatric: Memory, affect and judgment normal.    Lab Results  Component Value Date   TSH 1.89 10/06/2011   Lab Results  Component Value Date   WBC 8.1 01/13/2012   HGB 12.1 01/13/2012   HCT 37.1 01/13/2012   MCV 92.9 01/13/2012   PLT 230.0 01/13/2012   Lab Results  Component Value Date   CREATININE 0.8 01/13/2012   BUN 19 01/13/2012   NA 141 01/13/2012   K 4.6 01/13/2012   CL 105 01/13/2012   CO2 30 01/13/2012   Lab Results  Component Value Date   ALT 20 01/13/2012   AST 20 01/13/2012   ALKPHOS 93 01/13/2012   BILITOT 0.1* 01/13/2012   Lab Results  Component Value Date   CHOL 149 11/11/2011   Lab Results  Component Value Date   HDL 46.20 11/11/2011   Lab Results  Component Value Date   LDLCALC 78 11/11/2011   Lab Results  Component Value Date   TRIG 125.0 11/11/2011   Lab  Results  Component Value Date   CHOLHDL 3 11/11/2011     Assessment & Plan  DM Had to change meds to Janumet secondary to insurance coverage. Avoid simple carbs  ESSENTIAL HYPERTENSION, BENIGN Adequately controlled today, no changes  Bronchitis Zpack, Medrol dospak and Tussionex

## 2012-02-28 NOTE — Assessment & Plan Note (Signed)
Zpack, Medrol dospak and Tussionex

## 2012-03-18 ENCOUNTER — Other Ambulatory Visit: Payer: Self-pay | Admitting: Obstetrics and Gynecology

## 2012-03-18 DIAGNOSIS — Z1231 Encounter for screening mammogram for malignant neoplasm of breast: Secondary | ICD-10-CM

## 2012-03-22 ENCOUNTER — Ambulatory Visit (INDEPENDENT_AMBULATORY_CARE_PROVIDER_SITE_OTHER): Payer: Medicare Other | Admitting: Family Medicine

## 2012-03-22 ENCOUNTER — Encounter: Payer: Self-pay | Admitting: Family Medicine

## 2012-03-22 VITALS — BP 148/92 | HR 84 | Temp 97.5°F | Ht 61.0 in | Wt 254.8 lb

## 2012-03-22 DIAGNOSIS — E119 Type 2 diabetes mellitus without complications: Secondary | ICD-10-CM

## 2012-03-22 DIAGNOSIS — I10 Essential (primary) hypertension: Secondary | ICD-10-CM

## 2012-03-22 DIAGNOSIS — M549 Dorsalgia, unspecified: Secondary | ICD-10-CM

## 2012-03-22 DIAGNOSIS — Z23 Encounter for immunization: Secondary | ICD-10-CM

## 2012-03-22 MED ORDER — METFORMIN HCL ER (OSM) 1000 MG PO TB24: 2000.0000 mg | ORAL_TABLET | Freq: Every day | ORAL | Status: DC

## 2012-03-22 MED ORDER — LIRAGLUTIDE 18 MG/3ML ~~LOC~~ SOLN: SUBCUTANEOUS | Status: DC

## 2012-03-22 NOTE — Assessment & Plan Note (Signed)
Well controlled at today's visit no changes to meds

## 2012-03-22 NOTE — Progress Notes (Signed)
Patient ID: Amber [name], female   DOB: 12-02-1945, 66 y.o.   MRN: [id number] MARLIS RUDDY [id number] 02-28-1946 03/22/2012      Progress Note-Follow Up  Subjective  Chief Complaint  Chief Complaint  Patient presents with  . Follow-up    4 week follow up  . Injections    flu    HPI  Patient is a 66 year old Caucasian female who is in today with her husband for further evaluation. She was struggling with fatigue place and worsening shortness of breath as well as back pain and hip pain. She went to see her orthopedist and is actually recently had 6 steroid injections. She says it she's is pain-free she's been years. No radicular symptoms or incontinence. Her breathing is better she's active and exercising and more active. She continues to struggle with some hot flashes but they're slightly improved. No other acute illness. Her blood sugars continue to run high. They note her blood sugars are running roughly 150-280. No chest pain, palpitations, shortness of breath, GI or GU complaints noted today.  Past Medical History  Diagnosis Date  . Thyroid disease   . Hyperlipidemia   . Hypertension     5 stents  . Arthritis   . Depression   . Diabetes mellitus     type 2  . Hypoxia 10/07/2010  . Skin lesion of back 10/18/2010  . Viral infection characterized by skin and mucous membrane lesions 10/18/2010  . Vitamin D deficiency 11/21/2010  . Back pain 11/21/2010  . Motion sickness 12/04/2010  . UTI (lower urinary tract infection) 09/08/2011  . Hot flashes 10/06/2011  . Bronchitis 02/28/2012    Past Surgical History  Procedure Date  . Angioplasty 1980  . Angioplasty 2006    3 stents  . Angioplasty 2009    2 stents  . Cholecystectomy   . Thyroidectomy 1950    for goiter  . Abdominal hysterectomy 1977    partial  . Diverticulitis 1978    exploratory  . Right rotator cuff repair 2004  . Left rotator cuff repair 2004  . Bone fusion left foot 2010    7 screws in foot,  bunionectomy  . Hip surgery     large bone spur removed, right  . Back surgery 2004    lumbar discectomy for bulging disc    Family History  Problem Relation Age of Onset  . Heart disease Father   . Diabetes Sister   . Hypertension Sister   . Depression Sister   . Cancer Sister     colon cancer s/p colectomy  . Emphysema Brother     smoker  . Diabetes Daughter   . Hypertension Daughter   . Depression Daughter   . Diverticulitis Daughter   . Other Daughter     Trichotrilomania  . Aneurysm Maternal Grandmother     brain aneurysm  . Diabetes Paternal Grandmother   . Hypertension Paternal Grandmother   . Cancer Paternal Grandmother     colon  . Crohn's disease Paternal Grandfather   . Emphysema Brother     smoker  . Heart disease Brother   . Diabetes Brother   . Cancer Brother     thyroid  . Coronary artery disease Brother     s/p MI's and 7 stents  . Diabetes Brother   . Other Brother     back disease w/ bulging discs  . Colon polyps Brother     esophageal and tongue polyps  . Diabetes Sister   .  Other Sister     short term memory loss  . Aneurysm Sister     brain aneursm s/p coiling  . Diabetes Son   . Anxiety disorder Son     History   Social History  . Marital Status: Married    Spouse Name: N/A    Number of Children: N/A  . Years of Education: N/A   Occupational History  . retired Geologist, engineering    Social History Main Topics  . Smoking status: Never Smoker   . Smokeless tobacco: Never Used  . Alcohol Use: No  . Drug Use: No  . Sexually Active: Yes -- Female partner(s)   Other Topics Concern  . Not on file   Social History Narrative  . No narrative on file    Current Outpatient Prescriptions on File Prior to Visit  Medication Sig Dispense Refill  . amLODipine-benazepril (LOTREL) 5-20 MG per capsule Take 1 capsule by mouth daily.  90 capsule  1  . aspirin 81 MG tablet Take 81 mg by mouth 2 (two) times daily.       . Blood Glucose  Monitoring Suppl (ONE TOUCH ULTRA MINI) W/DEVICE KIT Check BS qam and as needed  1 each  3  . carvedilol (COREG) 12.5 MG tablet Take 0.5 tablets (6.25 mg total) by mouth 2 (two) times daily.  180 tablet  1  . Cholecalciferol (VITAMIN D-3 PO) Take 2 tablets by mouth daily.       . clopidogrel (PLAVIX) 75 MG tablet Take 1 tablet (75 mg total) by mouth daily.  90 tablet  2  . escitalopram (LEXAPRO) 20 MG tablet Take 2 tablets (40 mg total) by mouth daily.  60 tablet  5  . ezetimibe-simvastatin (VYTORIN) 10-40 MG per tablet Take 1 tablet by mouth at bedtime.  90 tablet  1  . gabapentin (NEURONTIN) 300 MG capsule Take 2 capsules (600 mg total) by mouth 2 (two) times daily.  360 capsule  1  . glucose blood test strip Check in qam and as needed  100 each  3  . isosorbide mononitrate (IMDUR) 60 MG 24 hr tablet Take 1 tablet (60 mg total) by mouth daily.  90 tablet  1  . levothyroxine (SYNTHROID, LEVOTHROID) 25 MCG tablet Take 1 tablet (25 mcg total) by mouth daily.  90 tablet  2  . Milnacipran (SAVELLA) 50 MG TABS Take 50 mg by mouth 2 (two) times daily.      . ONE TOUCH LANCETS MISC Use every am and as needed  200 each  3  . pantoprazole (PROTONIX) 40 MG tablet Take 1 tablet (40 mg total) by mouth daily.  90 tablet  2  . Probiotic Product (PROBIOTIC FORMULA PO) Take by mouth.        . sucralfate (CARAFATE) 1 G tablet Take 1 tablet (1 g total) by mouth as needed.  90 tablet  1  . Liraglutide (VICTOZA) 18 MG/3ML SOLN 0.6 mg [place] daily x 7 days and then increase to 1.2 mg [place] daily  6 mL  0    Allergies  Allergen Reactions  . Percocet (Oxycodone-Acetaminophen) Itching    Review of Systems  Review of Systems  Constitutional: Positive for malaise/fatigue. Negative for fever.  HENT: Negative for congestion.   Eyes: Negative for discharge.  Respiratory: Negative for shortness of breath.   Cardiovascular: Negative for chest pain, palpitations and leg swelling.  Gastrointestinal: Negative for nausea,  abdominal pain and diarrhea.  Genitourinary: Negative for dysuria.  Musculoskeletal: Negative for falls.  Skin: Negative for rash.  Neurological: Negative for loss of consciousness and headaches.  Endo/Heme/Allergies: Negative for polydipsia.  Psychiatric/Behavioral: Negative for depression and suicidal ideas. The patient is not nervous/anxious and does not have insomnia.     Objective  BP 148/92  Pulse 84  Temp 97.5 F (36.4 C) (Temporal)  Ht 5\' 1"  (1.549 m)  Wt 254 lb 12.8 oz (115.577 kg)  BMI 48.14 kg/m2  SpO2 93%  Physical Exam  Physical Exam  Constitutional: She is oriented to person, place, and time and well-developed, well-nourished, and in no distress. No distress.  HENT:  Head: Normocephalic and atraumatic.  Eyes: Conjunctivae normal are normal.  Neck: Neck supple. No thyromegaly present.  Cardiovascular: Normal rate, regular rhythm and normal heart sounds.   No murmur heard. Pulmonary/Chest: Effort normal and breath sounds normal. She has no wheezes.  Abdominal: She exhibits no distension and no mass.  Musculoskeletal: She exhibits no edema.  Lymphadenopathy:    She has no cervical adenopathy.  Neurological: She is alert and oriented to person, place, and time.  Skin: Skin is warm and dry. No rash noted. She is not diaphoretic.  Psychiatric: Memory, affect and judgment normal.    Lab Results  Component Value Date   TSH 1.89 10/06/2011   Lab Results  Component Value Date   WBC 8.1 01/13/2012   HGB 12.1 01/13/2012   HCT 37.1 01/13/2012   MCV 92.9 01/13/2012   PLT 230.0 01/13/2012   Lab Results  Component Value Date   CREATININE 0.8 01/13/2012   BUN 19 01/13/2012   NA 141 01/13/2012   K 4.6 01/13/2012   CL 105 01/13/2012   CO2 30 01/13/2012   Lab Results  Component Value Date   ALT 20 01/13/2012   AST 20 01/13/2012   ALKPHOS 93 01/13/2012   BILITOT 0.1* 01/13/2012   Lab Results  Component Value Date   CHOL 149 11/11/2011   Lab Results  Component Value Date    HDL 46.20 11/11/2011   Lab Results  Component Value Date   LDLCALC 78 11/11/2011   Lab Results  Component Value Date   TRIG 125.0 11/11/2011   Lab Results  Component Value Date   CHOLHDL 3 11/11/2011     Assessment & Plan  Back pain Has had numerous steroid injections in her back and hips with Dr. Modesto Charon her orthopedist in his is pain-free she's been in 20 years since then. Shot about a week ago. He reports overall she's been much more active and in much less pain. For shortness of breath is even greatly improved.  ESSENTIAL HYPERTENSION, BENIGN Well controlled at today's visit no changes to meds  DM Still poor control d/c Janumet and restart metformin ER 2000 mg daily, given samples of Victoza to stat 0.6 mg [place] daily x 7 days then increase to 1.2 mg daily

## 2012-03-22 NOTE — Patient Instructions (Addendum)
Diabetes and Exercise  Regular exercise is important and can help:   · Control blood glucose (sugar).  · Decrease blood pressure.  ·   · Control blood lipids (cholesterol, triglycerides).  · Improve overall health.  BENEFITS FROM EXERCISE  · Improved fitness.  · Improved flexibility.  · Improved endurance.  · Increased bone density.  · Weight control.  · Increased muscle strength.  · Decreased body fat.  · Improvement of the body's use of insulin, a hormone.  · Increased insulin sensitivity.  · Reduction of insulin needs.  · Reduced stress and tension.  · Helps you feel better.  People with diabetes who add exercise to their lifestyle gain additional benefits, including:  · Weight loss.  · Reduced appetite.  · Improvement of the body's use of blood glucose.  · Decreased risk factors for heart disease:  · Lowering of cholesterol and triglycerides.  · Raising the level of good cholesterol (high-density lipoproteins, HDL).  · Lowering blood sugar.  · Decreased blood pressure.  TYPE 1 DIABETES AND EXERCISE  · Exercise will usually lower your blood glucose.  · If blood glucose is greater than 240 mg/dl, check urine ketones. If ketones are present, do not exercise.  · Location of the insulin injection sites may need to be adjusted with exercise. Avoid injecting insulin into areas of the body that will be exercised. For example, avoid injecting insulin into:  · The arms when playing tennis.  · The legs when jogging. For more information, discuss this with your caregiver.  · Keep a record of:  · Food intake.  · Type and amount of exercise.  · Expected peak times of insulin action.  · Blood glucose levels.  Do this before, during, and after exercise. Review your records with your caregiver. This will help you to develop guidelines for adjusting food intake and insulin amounts.   TYPE 2 DIABETES AND EXERCISE  · Regular physical activity can help control blood glucose.  · Exercise is important because it may:  · Increase the  body's sensitivity to insulin.  · Improve blood glucose control.  · Exercise reduces the risk of heart disease. It decreases serum cholesterol and triglycerides. It also lowers blood pressure.  · Those who take insulin or oral hypoglycemic agents should watch for signs of hypoglycemia. These signs include dizziness, shaking, sweating, chills, and confusion.  · Body water is lost during exercise. It must be replaced. This will help to avoid loss of body fluids (dehydration) or heat stroke.  Be sure to talk to your caregiver before starting an exercise program to make sure it is safe for you. Remember, any activity is better than none.   Document Released: 07/12/2003 Document Revised: 07/14/2011 Document Reviewed: 10/26/2008  [employer]® Patient Information ©2013 [employer].

## 2012-03-22 NOTE — Assessment & Plan Note (Signed)
Has had numerous steroid injections in her back and hips with Dr. Modesto Charon her orthopedist in his is pain-free she's been in 20 years since then. Shot about a week ago. He reports overall she's been much more active and in much less pain. For shortness of breath is even greatly improved.

## 2012-03-22 NOTE — Assessment & Plan Note (Signed)
Still poor control d/c Janumet and restart metformin ER 2000 mg daily, given samples of Victoza to stat 0.6 mg [place] daily x 7 days then increase to 1.2 mg daily

## 2012-04-02 ENCOUNTER — Ambulatory Visit (INDEPENDENT_AMBULATORY_CARE_PROVIDER_SITE_OTHER): Payer: Medicare Other | Admitting: Family Medicine

## 2012-04-02 ENCOUNTER — Encounter: Payer: Self-pay | Admitting: Family Medicine

## 2012-04-02 VITALS — BP 144/71 | HR 106 | Temp 97.5°F | Ht 61.0 in | Wt 250.0 lb

## 2012-04-02 DIAGNOSIS — E119 Type 2 diabetes mellitus without complications: Secondary | ICD-10-CM

## 2012-04-02 DIAGNOSIS — N390 Urinary tract infection, site not specified: Secondary | ICD-10-CM

## 2012-04-02 DIAGNOSIS — R232 Flushing: Secondary | ICD-10-CM

## 2012-04-02 DIAGNOSIS — I10 Essential (primary) hypertension: Secondary | ICD-10-CM

## 2012-04-02 DIAGNOSIS — M549 Dorsalgia, unspecified: Secondary | ICD-10-CM

## 2012-04-02 DIAGNOSIS — R509 Fever, unspecified: Secondary | ICD-10-CM

## 2012-04-02 DIAGNOSIS — N951 Menopausal and female climacteric states: Secondary | ICD-10-CM

## 2012-04-02 LAB — POCT URINALYSIS DIPSTICK
Bilirubin, UA: NEGATIVE
Glucose, UA: NEGATIVE
Spec Grav, UA: 1.025

## 2012-04-02 MED ORDER — CEFTRIAXONE SODIUM 500 MG IJ SOLR: 500.0000 mg | Freq: Once | INTRAMUSCULAR | Status: DC

## 2012-04-02 MED ORDER — CEFTRIAXONE SODIUM 500 MG IJ SOLR
500.0000 mg | Freq: Once | INTRAMUSCULAR | Status: AC
  Administered 2012-04-02: 500 mg via INTRAMUSCULAR

## 2012-04-02 MED ORDER — CIPROFLOXACIN HCL 250 MG PO TABS: 250.0000 mg | ORAL_TABLET | Freq: Two times a day (BID) | ORAL | Status: DC

## 2012-04-02 NOTE — Assessment & Plan Note (Signed)
Worse suddenly for the last 3 days. Suspect secondary infection

## 2012-04-02 NOTE — Assessment & Plan Note (Signed)
Still greatly improved s/p infections with ortho

## 2012-04-02 NOTE — Assessment & Plan Note (Signed)
Sugars have been better, is tolerating Victoza will have her hang at 0.6 mg for another week til the hot flashes calm down

## 2012-04-02 NOTE — Patient Instructions (Signed)
Start a probiotic and cranberry tabs and increase hydration  Urinary Tract Infection Urinary tract infections (UTIs) can develop anywhere along your urinary tract. Your urinary tract is your body's drainage system for removing wastes and extra water. Your urinary tract includes two kidneys, two ureters, a bladder, and a urethra. Your kidneys are a pair of bean-shaped organs. Each kidney is about the size of your fist. They are located below your ribs, one on each side of your spine. CAUSES Infections are caused by microbes, which are microscopic organisms, including fungi, viruses, and bacteria. These organisms are so small that they can only be seen through a microscope. Bacteria are the microbes that most commonly cause UTIs. SYMPTOMS  Symptoms of UTIs may vary by age and gender of the patient and by the location of the infection. Symptoms in young women typically include a frequent and intense urge to urinate and a painful, burning feeling in the bladder or urethra during urination. Older women and men are more likely to be tired, shaky, and weak and have muscle aches and abdominal pain. A fever may mean the infection is in your kidneys. Other symptoms of a kidney infection include pain in your back or sides below the ribs, nausea, and vomiting. DIAGNOSIS To diagnose a UTI, your caregiver will ask you about your symptoms. Your caregiver also will ask to provide a urine sample. The urine sample will be tested for bacteria and white blood cells. White blood cells are made by your body to help fight infection. TREATMENT  Typically, UTIs can be treated with medication. Because most UTIs are caused by a bacterial infection, they usually can be treated with the use of antibiotics. The choice of antibiotic and length of treatment depend on your symptoms and the type of bacteria causing your infection. HOME CARE INSTRUCTIONS  If you were prescribed antibiotics, take them exactly as your caregiver instructs  you. Finish the medication even if you feel better after you have only taken some of the medication.  Drink enough water and fluids to keep your urine clear or pale yellow.  Avoid caffeine, tea, and carbonated beverages. They tend to irritate your bladder.  Empty your bladder often. Avoid holding urine for long periods of time.  Empty your bladder before and after sexual intercourse.  After a bowel movement, women should cleanse from front to back. Use each tissue only once. SEEK MEDICAL CARE IF:   You have back pain.  You develop a fever.  Your symptoms do not begin to resolve within 3 days. SEEK IMMEDIATE MEDICAL CARE IF:   You have severe back pain or lower abdominal pain.  You develop chills.  You have nausea or vomiting.  You have continued burning or discomfort with urination. MAKE SURE YOU:   Understand these instructions.  Will watch your condition.  Will get help right away if you are not doing well or get worse. Document Released: 01/29/2005 Document Revised: 10/21/2011 Document Reviewed: 05/30/2011 [facility] Patient Information 2013 [place], [place].

## 2012-04-02 NOTE — Assessment & Plan Note (Signed)
Will give Rocephin 500mg  IM once and then start on antibiotics and probiotics, increase hydration and add cranberry tab

## 2012-04-02 NOTE — Progress Notes (Signed)
Patient ID: Amber [name], female   DOB: December 13, 1945, 66 y.o.   MRN: [id number] Amber [name] [id number] Jun 23, 1945 04/02/2012      Progress Note-Follow Up  Subjective  Chief Complaint  Chief Complaint  Patient presents with  . sweats    not feeling well    HPI  Patient is a 66 year old Caucasian female who is in today for a three-day history of worsening fevers and chills sweats and malaise. She has myalgias and anorexia. Some low-grade abdominal pain as well. She has complete exhaustion. Prior to these previously been feeling much much better. Her back pain continues to be improved. She had been tolerating the Pitocin 0.6 mg but notes when it went up to 1.2 was when they really saw her sugars start to drop. Unfortunately the symptoms as described in occurred. Last time her symptoms come is that she did have a UTI. No chest pain palpitation shortness of breath  Past Medical History  Diagnosis Date  . Thyroid disease   . Hyperlipidemia   . Hypertension     5 stents  . Arthritis   . Depression   . Diabetes mellitus     type 2  . Hypoxia 10/07/2010  . Skin lesion of back 10/18/2010  . Viral infection characterized by skin and mucous membrane lesions 10/18/2010  . Vitamin D deficiency 11/21/2010  . Back pain 11/21/2010  . Motion sickness 12/04/2010  . UTI (lower urinary tract infection) 09/08/2011  . Hot flashes 10/06/2011  . Bronchitis 02/28/2012    Past Surgical History  Procedure Date  . Angioplasty 1980  . Angioplasty 2006    3 stents  . Angioplasty 2009    2 stents  . Cholecystectomy   . Thyroidectomy 1950    for goiter  . Abdominal hysterectomy 1977    partial  . Diverticulitis 1978    exploratory  . Right rotator cuff repair 2004  . Left rotator cuff repair 2004  . Bone fusion left foot 2010    7 screws in foot, bunionectomy  . Hip surgery     large bone spur removed, right  . Back surgery 2004    lumbar discectomy for bulging disc    Family History    Problem Relation Age of Onset  . Heart disease Father   . Diabetes Sister   . Hypertension Sister   . Depression Sister   . Cancer Sister     colon cancer s/p colectomy  . Emphysema Brother     smoker  . Diabetes Daughter   . Hypertension Daughter   . Depression Daughter   . Diverticulitis Daughter   . Other Daughter     Trichotrilomania  . Aneurysm Maternal Grandmother     brain aneurysm  . Diabetes Paternal Grandmother   . Hypertension Paternal Grandmother   . Cancer Paternal Grandmother     colon  . Crohn's disease Paternal Grandfather   . Emphysema Brother     smoker  . Heart disease Brother   . Diabetes Brother   . Cancer Brother     thyroid  . Coronary artery disease Brother     s/p MI's and 7 stents  . Diabetes Brother   . Other Brother     back disease w/ bulging discs  . Colon polyps Brother     esophageal and tongue polyps  . Diabetes Sister   . Other Sister     short term memory loss  . Aneurysm Sister  brain aneursm s/p coiling  . Diabetes Son   . Anxiety disorder Son     History   Social History  . Marital Status: Married    Spouse Name: N/A    Number of Children: N/A  . Years of Education: N/A   Occupational History  . retired Geologist, engineering    Social History Main Topics  . Smoking status: Never Smoker   . Smokeless tobacco: Never Used  . Alcohol Use: No  . Drug Use: No  . Sexually Active: Yes -- Female partner(s)   Other Topics Concern  . Not on file   Social History Narrative  . No narrative on file    Current Outpatient Prescriptions on File Prior to Visit  Medication Sig Dispense Refill  . amLODipine-benazepril (LOTREL) 5-20 MG per capsule Take 1 capsule by mouth daily.  90 capsule  1  . aspirin 81 MG tablet Take 81 mg by mouth 2 (two) times daily.       . Blood Glucose Monitoring Suppl (ONE TOUCH ULTRA MINI) W/DEVICE KIT Check BS qam and as needed  1 each  3  . carvedilol (COREG) 12.5 MG tablet Take 0.5 tablets (6.25  mg total) by mouth 2 (two) times daily.  180 tablet  1  . Cholecalciferol (VITAMIN D-3 PO) Take 2 tablets by mouth daily.       . clopidogrel (PLAVIX) 75 MG tablet Take 1 tablet (75 mg total) by mouth daily.  90 tablet  2  . escitalopram (LEXAPRO) 20 MG tablet Take 2 tablets (40 mg total) by mouth daily.  60 tablet  5  . ezetimibe-simvastatin (VYTORIN) 10-40 MG per tablet Take 1 tablet by mouth at bedtime.  90 tablet  1  . gabapentin (NEURONTIN) 300 MG capsule Take 2 capsules (600 mg total) by mouth 2 (two) times daily.  360 capsule  1  . glucose blood test strip Check in qam and as needed  100 each  3  . isosorbide mononitrate (IMDUR) 60 MG 24 hr tablet Take 1 tablet (60 mg total) by mouth daily.  90 tablet  1  . levothyroxine (SYNTHROID, LEVOTHROID) 25 MCG tablet Take 1 tablet (25 mcg total) by mouth daily.  90 tablet  2  . Liraglutide (VICTOZA) 18 MG/3ML SOLN 0.6 mg [place] daily x 7 days and then increase to 1.2 mg [place] daily  6 mL  0  . metformin (FORTAMET) 1000 MG (OSM) 24 hr tablet Take 2 tablets (2,000 mg total) by mouth daily with breakfast.  60 tablet  2  . Milnacipran (SAVELLA) 50 MG TABS Take 50 mg by mouth 2 (two) times daily.      . ONE TOUCH LANCETS MISC Use every am and as needed  200 each  3  . pantoprazole (PROTONIX) 40 MG tablet Take 1 tablet (40 mg total) by mouth daily.  90 tablet  2  . Probiotic Product (PROBIOTIC FORMULA PO) Take by mouth.        . sucralfate (CARAFATE) 1 G tablet Take 1 tablet (1 g total) by mouth as needed.  90 tablet  1    Allergies  Allergen Reactions  . Percocet (Oxycodone-Acetaminophen) Itching    Review of Systems  Review of Systems  Constitutional: Positive for fever, chills and malaise/fatigue.  HENT: Negative for congestion.   Eyes: Negative for pain and discharge.  Respiratory: Negative for shortness of breath.   Cardiovascular: Negative for chest pain, palpitations and leg swelling.  Gastrointestinal: Positive for abdominal pain.  Negative  for heartburn, nausea and diarrhea.  Genitourinary: Negative for dysuria.  Musculoskeletal: Negative for falls.  Skin: Negative for rash.  Neurological: Negative for loss of consciousness and headaches.  Endo/Heme/Allergies: Negative for polydipsia.  Psychiatric/Behavioral: Negative for depression and suicidal ideas. The patient is not nervous/anxious and does not have insomnia.     Objective  BP 144/71  Pulse 106  Temp 97.5 F (36.4 C) (Temporal)  Ht 5\' 1"  (1.549 m)  Wt 250 lb (113.399 kg)  BMI 47.24 kg/m2  SpO2 93%  Physical Exam  Physical Exam  Constitutional: She is oriented to person, place, and time and well-developed, well-nourished, and in no distress. No distress.  HENT:  Head: Normocephalic and atraumatic.  Eyes: Conjunctivae normal are normal.  Neck: Neck supple. No thyromegaly present.  Cardiovascular: Normal rate, regular rhythm and normal heart sounds.   No murmur heard. Pulmonary/Chest: Effort normal and breath sounds normal. She has no wheezes.  Abdominal: She exhibits no distension and no mass.  Musculoskeletal: She exhibits no edema.  Lymphadenopathy:    She has no cervical adenopathy.  Neurological: She is alert and oriented to person, place, and time.  Skin: Skin is warm and dry. No rash noted. She is not diaphoretic.  Psychiatric: Memory, affect and judgment normal.    Lab Results  Component Value Date   TSH 1.89 10/06/2011   Lab Results  Component Value Date   WBC 8.1 01/13/2012   HGB 12.1 01/13/2012   HCT 37.1 01/13/2012   MCV 92.9 01/13/2012   PLT 230.0 01/13/2012   Lab Results  Component Value Date   CREATININE 0.8 01/13/2012   BUN 19 01/13/2012   NA 141 01/13/2012   K 4.6 01/13/2012   CL 105 01/13/2012   CO2 30 01/13/2012   Lab Results  Component Value Date   ALT 20 01/13/2012   AST 20 01/13/2012   ALKPHOS 93 01/13/2012   BILITOT 0.1* 01/13/2012   Lab Results  Component Value Date   CHOL 149 11/11/2011   Lab Results  Component Value Date    HDL 46.20 11/11/2011   Lab Results  Component Value Date   LDLCALC 78 11/11/2011   Lab Results  Component Value Date   TRIG 125.0 11/11/2011   Lab Results  Component Value Date   CHOLHDL 3 11/11/2011     Assessment & Plan  Back pain Still greatly improved s/p infections with ortho  DM Sugars have been better, is tolerating Victoza will have her hang at 0.6 mg for another week til the hot flashes calm down  Hot flashes Worse suddenly for the last 3 days. Suspect secondary infection  UTI (lower urinary tract infection) Will give Rocephin 500mg  IM once and then start on antibiotics and probiotics, increase hydration and add cranberry tab  ESSENTIAL HYPERTENSION, BENIGN Mild elevation with acute illness, will continue to monitor

## 2012-04-04 LAB — URINE CULTURE: Colony Count: >=100000

## 2012-04-04 NOTE — Assessment & Plan Note (Signed)
Mild elevation with acute illness, will continue to monitor 

## 2012-04-05 NOTE — Progress Notes (Signed)
Quick Note:  Patients spouse Informed and voiced understanding ______ 

## 2012-04-06 ENCOUNTER — Other Ambulatory Visit (INDEPENDENT_AMBULATORY_CARE_PROVIDER_SITE_OTHER): Payer: Medicare Other

## 2012-04-06 DIAGNOSIS — I10 Essential (primary) hypertension: Secondary | ICD-10-CM

## 2012-04-06 DIAGNOSIS — E119 Type 2 diabetes mellitus without complications: Secondary | ICD-10-CM

## 2012-04-06 DIAGNOSIS — E785 Hyperlipidemia, unspecified: Secondary | ICD-10-CM

## 2012-04-06 LAB — CBC
MCHC: 32.8 g/dL (ref 30.0–36.0)
MCV: 92.9 fl (ref 78.0–100.0)
RBC: 3.88 Mil/uL (ref 3.87–5.11)

## 2012-04-06 LAB — LIPID PANEL
Cholesterol: 197 mg/dL (ref 0–200)
HDL: 45.8 mg/dL (ref >39.00)
LDL Cholesterol: 114 mg/dL — ABNORMAL HIGH (ref 0–99)
Total CHOL/HDL Ratio: 4
Triglycerides: 187 mg/dL — ABNORMAL HIGH (ref 0.0–149.0)
VLDL: 37.4 mg/dL (ref 0.0–40.0)

## 2012-04-06 LAB — HEPATIC FUNCTION PANEL
ALT: 21 U/L (ref 0–35)
Albumin: 3.5 g/dL (ref 3.5–5.2)
Bilirubin, Direct: 0.1 mg/dL (ref 0.0–0.3)
Total Protein: 6 g/dL (ref 6.0–8.3)

## 2012-04-06 LAB — RENAL FUNCTION PANEL
Albumin: 3.5 g/dL (ref 3.5–5.2)
BUN: 25 mg/dL — ABNORMAL HIGH (ref 6–23)
Chloride: 101 mEq/L (ref 96–112)
Creatinine, Ser: 1 mg/dL (ref 0.4–1.2)
Phosphorus: 4 mg/dL (ref 2.3–4.6)

## 2012-04-13 ENCOUNTER — Encounter: Payer: Self-pay | Admitting: Family Medicine

## 2012-04-13 ENCOUNTER — Ambulatory Visit (INDEPENDENT_AMBULATORY_CARE_PROVIDER_SITE_OTHER): Payer: Medicare Other | Admitting: Family Medicine

## 2012-04-13 VITALS — BP 133/83 | HR 104 | Temp 97.4°F | Ht 61.0 in | Wt 244.8 lb

## 2012-04-13 DIAGNOSIS — R232 Flushing: Secondary | ICD-10-CM

## 2012-04-13 DIAGNOSIS — I10 Essential (primary) hypertension: Secondary | ICD-10-CM

## 2012-04-13 DIAGNOSIS — N951 Menopausal and female climacteric states: Secondary | ICD-10-CM

## 2012-04-13 DIAGNOSIS — E119 Type 2 diabetes mellitus without complications: Secondary | ICD-10-CM

## 2012-04-13 DIAGNOSIS — R319 Hematuria, unspecified: Secondary | ICD-10-CM

## 2012-04-13 DIAGNOSIS — N390 Urinary tract infection, site not specified: Secondary | ICD-10-CM

## 2012-04-13 LAB — POCT URINALYSIS DIPSTICK
Ketones, UA: NEGATIVE
Protein, UA: NEGATIVE
pH, UA: 5

## 2012-04-13 MED ORDER — PHENAZOPYRIDINE HCL 200 MG PO TABS: 200.0000 mg | ORAL_TABLET | Freq: Three times a day (TID) | ORAL | Status: DC | PRN

## 2012-04-13 MED ORDER — LIRAGLUTIDE 18 MG/3ML ~~LOC~~ SOLN: 1.2000 mg | Freq: Every day | SUBCUTANEOUS | Status: DC

## 2012-04-13 MED ORDER — AMOXICILLIN-POT CLAVULANATE 875-125 MG PO TABS: 1.0000 | ORAL_TABLET | Freq: Two times a day (BID) | ORAL | Status: DC

## 2012-04-13 NOTE — Assessment & Plan Note (Signed)
Numbers greatly improved on Victoza 0.6 mg daily and sees blood sugars in the 130s in am recently. Continue same for now, increase to 1.2 mg next week if sugars continue to run higher than 130s, minimize simple carbs and continue current increased exercise.

## 2012-04-13 NOTE — Addendum Note (Signed)
Addended by: Baldemar Lenis R on: 04/13/2012 04:44 PM   Modules accepted: Orders

## 2012-04-13 NOTE — Assessment & Plan Note (Signed)
Symptoms improved on antibiotics but returning in past 24 hours, will start Augmentin 875 mg po bid x 7-10 days, Pyridium prn, increase hydration.

## 2012-04-13 NOTE — Assessment & Plan Note (Signed)
Good weight loss with recent improvement of back pain and increased activity.

## 2012-04-13 NOTE — Progress Notes (Signed)
Patient ID: Amber [name], female   DOB: 11/16/1945, 66 y.o.   MRN: [id number] Amber [name] [id number] 06-03-1945 04/13/2012      Progress Note-Follow Up  Subjective  Chief Complaint  Chief Complaint  Patient presents with  . Follow-up    3 month    HPI  Patient is a 66 year old Caucasian female who is in today for followup. She was greatly improved on the ciprofloxacin her mental status changes in her weakness got much better treatment of her Klebsiella UTI unfortunately after antibiotics were finished her symptoms began to return. She has increased urinary hesitancy once again. She is malaise, myalgias and weakness. Very low-grade confusion. No headache, fevers, chills, chest pain, palpitations, shortness of breath, GI complaints noted. She continues to walk and move more now that her back pain is improved after her steroid injections and is pleased with her weight loss. She does note she's had persistent hot flashes recently although they are somewhat improved.  Past Medical History  Diagnosis Date  . Thyroid disease   . Hyperlipidemia   . Hypertension     5 stents  . Arthritis   . Depression   . Diabetes mellitus     type 2  . Hypoxia 10/07/2010  . Skin lesion of back 10/18/2010  . Viral infection characterized by skin and mucous membrane lesions 10/18/2010  . Vitamin D deficiency 11/21/2010  . Back pain 11/21/2010  . Motion sickness 12/04/2010  . UTI (lower urinary tract infection) 09/08/2011  . Hot flashes 10/06/2011  . Bronchitis 02/28/2012    Past Surgical History  Procedure Date  . Angioplasty 1980  . Angioplasty 2006    3 stents  . Angioplasty 2009    2 stents  . Cholecystectomy   . Thyroidectomy 1950    for goiter  . Abdominal hysterectomy 1977    partial  . Diverticulitis 1978    exploratory  . Right rotator cuff repair 2004  . Left rotator cuff repair 2004  . Bone fusion left foot 2010    7 screws in foot, bunionectomy  . Hip surgery     large  bone spur removed, right  . Back surgery 2004    lumbar discectomy for bulging disc    Family History  Problem Relation Age of Onset  . Heart disease Father   . Diabetes Sister   . Hypertension Sister   . Depression Sister   . Cancer Sister     colon cancer s/p colectomy  . Emphysema Brother     smoker  . Diabetes Daughter   . Hypertension Daughter   . Depression Daughter   . Diverticulitis Daughter   . Other Daughter     Trichotrilomania  . Aneurysm Maternal Grandmother     brain aneurysm  . Diabetes Paternal Grandmother   . Hypertension Paternal Grandmother   . Cancer Paternal Grandmother     colon  . Crohn's disease Paternal Grandfather   . Emphysema Brother     smoker  . Heart disease Brother   . Diabetes Brother   . Cancer Brother     thyroid  . Coronary artery disease Brother     s/p MI's and 7 stents  . Diabetes Brother   . Other Brother     back disease w/ bulging discs  . Colon polyps Brother     esophageal and tongue polyps  . Diabetes Sister   . Other Sister     short term memory loss  . Aneurysm  Sister     brain aneursm s/p coiling  . Diabetes Son   . Anxiety disorder Son     History   Social History  . Marital Status: Married    Spouse Name: N/A    Number of Children: N/A  . Years of Education: N/A   Occupational History  . retired Geologist, engineering    Social History Main Topics  . Smoking status: Never Smoker   . Smokeless tobacco: Never Used  . Alcohol Use: No  . Drug Use: No  . Sexually Active: Yes -- Female partner(s)   Other Topics Concern  . Not on file   Social History Narrative  . No narrative on file    Current Outpatient Prescriptions on File Prior to Visit  Medication Sig Dispense Refill  . amLODipine-benazepril (LOTREL) 5-20 MG per capsule Take 1 capsule by mouth daily.  90 capsule  1  . aspirin 81 MG tablet Take 81 mg by mouth 2 (two) times daily.       . Blood Glucose Monitoring Suppl (ONE TOUCH ULTRA MINI)  W/DEVICE KIT Check BS qam and as needed  1 each  3  . carvedilol (COREG) 12.5 MG tablet Take 0.5 tablets (6.25 mg total) by mouth 2 (two) times daily.  180 tablet  1  . Cholecalciferol (VITAMIN D-3 PO) Take 2 tablets by mouth daily.       . clopidogrel (PLAVIX) 75 MG tablet Take 1 tablet (75 mg total) by mouth daily.  90 tablet  2  . escitalopram (LEXAPRO) 20 MG tablet Take 2 tablets (40 mg total) by mouth daily.  60 tablet  5  . ezetimibe-simvastatin (VYTORIN) 10-40 MG per tablet Take 1 tablet by mouth at bedtime.  90 tablet  1  . gabapentin (NEURONTIN) 300 MG capsule Take 2 capsules (600 mg total) by mouth 2 (two) times daily.  360 capsule  1  . glucose blood test strip Check in qam and as needed  100 each  3  . isosorbide mononitrate (IMDUR) 60 MG 24 hr tablet Take 1 tablet (60 mg total) by mouth daily.  90 tablet  1  . levothyroxine (SYNTHROID, LEVOTHROID) 25 MCG tablet Take 1 tablet (25 mcg total) by mouth daily.  90 tablet  2  . metformin (FORTAMET) 1000 MG (OSM) 24 hr tablet Take 2 tablets (2,000 mg total) by mouth daily with breakfast.  60 tablet  2  . Milnacipran (SAVELLA) 50 MG TABS Take 50 mg by mouth 2 (two) times daily.      . ONE TOUCH LANCETS MISC Use every am and as needed  200 each  3  . pantoprazole (PROTONIX) 40 MG tablet Take 1 tablet (40 mg total) by mouth daily.  90 tablet  2  . Probiotic Product (PROBIOTIC FORMULA PO) Take by mouth.        . sucralfate (CARAFATE) 1 G tablet Take 1 tablet (1 g total) by mouth as needed.  90 tablet  1  . [DISCONTINUED] Liraglutide (VICTOZA) 18 MG/3ML SOLN 0.6 mg [place] daily x 7 days and then increase to 1.2 mg [place] daily  6 mL  0    Allergies  Allergen Reactions  . Percocet (Oxycodone-Acetaminophen) Itching    Review of Systems  Review of Systems  Constitutional: Positive for malaise/fatigue. Negative for fever.  HENT: Negative for congestion.   Eyes: Negative for discharge.  Respiratory: Negative for shortness of breath.    Cardiovascular: Negative for chest pain, palpitations and leg swelling.  Gastrointestinal: Negative for nausea, abdominal pain and diarrhea.  Genitourinary: Positive for dysuria.  Musculoskeletal: Positive for myalgias. Negative for falls.  Skin: Negative for rash.  Neurological: Positive for weakness. Negative for loss of consciousness and headaches.  Endo/Heme/Allergies: Negative for polydipsia.  Psychiatric/Behavioral: Negative for depression and suicidal ideas. The patient is not nervous/anxious and does not have insomnia.     Objective  BP 133/83  Pulse 104  Temp 97.4 F (36.3 C) (Temporal)  Ht 5\' 1"  (1.549 m)  Wt 244 lb 12.8 oz (111.041 kg)  BMI 46.25 kg/m2  SpO2 92%  Physical Exam  Physical Exam  Constitutional: She is oriented to person, place, and time and well-developed, well-nourished, and in no distress. No distress.  HENT:  Head: Normocephalic and atraumatic.  Eyes: Conjunctivae normal are normal.  Neck: Neck supple. No thyromegaly present.  Cardiovascular: Normal rate, regular rhythm and normal heart sounds.   No murmur heard. Pulmonary/Chest: Effort normal and breath sounds normal. She has no wheezes.  Abdominal: She exhibits no distension and no mass.  Musculoskeletal: She exhibits no edema.  Lymphadenopathy:    She has no cervical adenopathy.  Neurological: She is alert and oriented to person, place, and time.  Skin: Skin is warm and dry. No rash noted. She is not diaphoretic.  Psychiatric: Memory, affect and judgment normal.    Lab Results  Component Value Date   TSH 3.36 04/06/2012   Lab Results  Component Value Date   WBC 10.2 04/06/2012   HGB 11.8* 04/06/2012   HCT 36.0 04/06/2012   MCV 92.9 04/06/2012   PLT 232.0 04/06/2012   Lab Results  Component Value Date   CREATININE 1.0 04/06/2012   BUN 25* 04/06/2012   NA 140 04/06/2012   K 4.1 04/06/2012   CL 101 04/06/2012   CO2 31 04/06/2012   Lab Results  Component Value Date   ALT 21 04/06/2012    AST 19 04/06/2012   ALKPHOS 82 04/06/2012   BILITOT 0.5 04/06/2012   Lab Results  Component Value Date   CHOL 197 04/06/2012   Lab Results  Component Value Date   HDL 45.80 04/06/2012   Lab Results  Component Value Date   LDLCALC 114* 04/06/2012   Lab Results  Component Value Date   TRIG 187.0* 04/06/2012   Lab Results  Component Value Date   CHOLHDL 4 04/06/2012     Assessment & Plan  DM Numbers greatly improved on Victoza 0.6 mg daily and sees blood sugars in the 130s in am recently. Continue same for now, increase to 1.2 mg next week if sugars continue to run higher than 130s, minimize simple carbs and continue current increased exercise.  ESSENTIAL HYPERTENSION, BENIGN Adequately controlled at this time. No changes.  Hot flashes Improved but still present will restart Icool again and advised to keep blood sugars even  MORBID OBESITY Good weight loss with recent improvement of back pain and increased activity.   UTI (lower urinary tract infection) Symptoms improved on antibiotics but returning in past 24 hours, will start Augmentin 875 mg po bid x 7-10 days, Pyridium prn, increase hydration.

## 2012-04-13 NOTE — Assessment & Plan Note (Signed)
Improved but still present will restart Icool again and advised to keep blood sugars even

## 2012-04-13 NOTE — Assessment & Plan Note (Signed)
Adequately controlled at this time. No changes.

## 2012-04-15 NOTE — Progress Notes (Signed)
Quick Note:  Patients spouse informed and voiced understanding ______ 

## 2012-04-30 ENCOUNTER — Ambulatory Visit (HOSPITAL_BASED_OUTPATIENT_CLINIC_OR_DEPARTMENT_OTHER)
Admission: RE | Admit: 2012-04-30 | Discharge: 2012-04-30 | Disposition: A | Discharge status: Home / Self Care | Payer: Medicare Other | Source: Ambulatory Visit | Attending: Family Medicine | Admitting: Family Medicine

## 2012-04-30 ENCOUNTER — Ambulatory Visit (INDEPENDENT_AMBULATORY_CARE_PROVIDER_SITE_OTHER): Payer: Medicare Other | Admitting: Family Medicine

## 2012-04-30 ENCOUNTER — Encounter: Payer: Self-pay | Admitting: Family Medicine

## 2012-04-30 VITALS — BP 132/76 | HR 91 | Temp 97.8°F | Ht 61.0 in | Wt 249.8 lb

## 2012-04-30 DIAGNOSIS — IMO0001 Reserved for inherently not codable concepts without codable children: Secondary | ICD-10-CM

## 2012-04-30 DIAGNOSIS — K5730 Diverticulosis of large intestine without perforation or abscess without bleeding: Secondary | ICD-10-CM

## 2012-04-30 DIAGNOSIS — R109 Unspecified abdominal pain: Secondary | ICD-10-CM

## 2012-04-30 DIAGNOSIS — R300 Dysuria: Secondary | ICD-10-CM

## 2012-04-30 DIAGNOSIS — N390 Urinary tract infection, site not specified: Secondary | ICD-10-CM

## 2012-04-30 DIAGNOSIS — R110 Nausea: Secondary | ICD-10-CM

## 2012-04-30 DIAGNOSIS — E119 Type 2 diabetes mellitus without complications: Secondary | ICD-10-CM

## 2012-04-30 DIAGNOSIS — N951 Menopausal and female climacteric states: Secondary | ICD-10-CM

## 2012-04-30 DIAGNOSIS — K219 Gastro-esophageal reflux disease without esophagitis: Secondary | ICD-10-CM

## 2012-04-30 DIAGNOSIS — R232 Flushing: Secondary | ICD-10-CM

## 2012-04-30 DIAGNOSIS — J8410 Pulmonary fibrosis, unspecified: Secondary | ICD-10-CM | POA: Insufficient documentation

## 2012-04-30 DIAGNOSIS — I10 Essential (primary) hypertension: Secondary | ICD-10-CM

## 2012-04-30 DIAGNOSIS — I319 Disease of pericardium, unspecified: Secondary | ICD-10-CM | POA: Insufficient documentation

## 2012-04-30 MED ORDER — OXYCODONE-ACETAMINOPHEN 10-325 MG PO TABS: 1.0000 | ORAL_TABLET | Freq: Three times a day (TID) | ORAL | Status: DC | PRN

## 2012-04-30 MED ORDER — CIPROFLOXACIN HCL 500 MG PO TABS: 500.0000 mg | ORAL_TABLET | Freq: Two times a day (BID) | ORAL | Status: DC

## 2012-04-30 MED ORDER — ESTROGENS CONJUGATED 0.3 MG PO TABS: 0.3000 mg | ORAL_TABLET | Freq: Every day | ORAL | Status: DC

## 2012-04-30 MED ORDER — PROMETHAZINE HCL 25 MG PO TABS: 25.0000 mg | ORAL_TABLET | Freq: Four times a day (QID) | ORAL | Status: DC | PRN

## 2012-04-30 MED ORDER — CEFTRIAXONE SODIUM 500 MG IJ SOLR
500.0000 mg | Freq: Once | INTRAMUSCULAR | Status: AC
  Administered 2012-04-30: 500 mg via INTRAMUSCULAR

## 2012-04-30 MED ORDER — IOHEXOL 300 MG/ML  SOLN
100.0000 mL | Freq: Once | INTRAMUSCULAR | Status: AC | PRN
  Administered 2012-04-30: 100 mL via INTRAVENOUS

## 2012-04-30 MED ORDER — RANITIDINE HCL 300 MG PO TABS: 300.0000 mg | ORAL_TABLET | Freq: Every day | ORAL | Status: DC

## 2012-04-30 NOTE — Patient Instructions (Signed)

## 2012-05-03 LAB — URINE CULTURE: Colony Count: >=100000

## 2012-05-05 ENCOUNTER — Encounter: Payer: Self-pay | Admitting: Family Medicine

## 2012-05-05 DIAGNOSIS — R109 Unspecified abdominal pain: Secondary | ICD-10-CM | POA: Insufficient documentation

## 2012-05-05 NOTE — Assessment & Plan Note (Signed)
Improved control recently no changes needed

## 2012-05-05 NOTE — Assessment & Plan Note (Signed)
Likely related to kidney stone and UTI, passed stone in office with improvement in symptoms. To her plethora of symptoms and concerns Will go ahead and proceed with CT abdomen and pelvis which showed nothing more than a coin in her abdomen and some diverticulosis but no diverticulitis. Will treat UTI and awake kidney stone analysis.

## 2012-05-05 NOTE — Assessment & Plan Note (Signed)
Well controlled despite pain, no changes

## 2012-05-05 NOTE — Assessment & Plan Note (Signed)
Treated with antibiotics and increased fluids. May use cranberry when necessary and given a prescription for Pyridium for pain.

## 2012-05-05 NOTE — Assessment & Plan Note (Signed)
No diverticulitis on CT scan

## 2012-05-05 NOTE — Progress Notes (Signed)
Patient ID: Amber [name], female   DOB: 1946/03/02, 67 y.o.   MRN: [id number] HAROLDINE REDLER [id number] 1945/07/16 05/05/2012      Progress Note-Follow Up  Subjective  Chief Complaint  Chief Complaint  Patient presents with  . Excessive Sweating  . Urinary Tract Infection    pain during urination X 1  . Nausea    HPI  Patient is a 67 year old Caucasian female who is in today with her husband. It's been complaining of increasing abdominal pain with nausea and urinary frequency and dysuria for the last couple of days. She's had some intermittent sweats and chills. Some anorexia is also noted. No chest pain or palpitations her blood sugars are well controlled. During her visit she gets a urine sample and appears to pass a kidney stone and had some relief from her discomfort at that time. Her nausea and her diaphoresis improved somewhat. No chest pain, palpitations, headache  Past Medical History  Diagnosis Date  . Thyroid disease   . Hyperlipidemia   . Hypertension     5 stents  . Arthritis   . Depression   . Diabetes mellitus     type 2  . Hypoxia 10/07/2010  . Skin lesion of back 10/18/2010  . Viral infection characterized by skin and mucous membrane lesions 10/18/2010  . Vitamin D deficiency 11/21/2010  . Back pain 11/21/2010  . Motion sickness 12/04/2010  . UTI (lower urinary tract infection) 09/08/2011  . Hot flashes 10/06/2011  . Bronchitis 02/28/2012  . Abdominal  pain, other specified site 05/05/2012    Past Surgical History  Procedure Date  . Angioplasty 1980  . Angioplasty 2006    3 stents  . Angioplasty 2009    2 stents  . Cholecystectomy   . Thyroidectomy 1950    for goiter  . Diverticulitis 1978    exploratory  . Right rotator cuff repair 2004  . Left rotator cuff repair 2004  . Bone fusion left foot 2010    7 screws in foot, bunionectomy  . Hip surgery     large bone spur removed, right  . Back surgery 2004    lumbar discectomy for bulging disc  .  Abdominal hysterectomy 1977    partial    Family History  Problem Relation Age of Onset  . Heart disease Father   . Diabetes Sister   . Hypertension Sister   . Depression Sister   . Cancer Sister     colon cancer s/p colectomy  . Emphysema Brother     smoker  . Diabetes Daughter   . Hypertension Daughter   . Depression Daughter   . Diverticulitis Daughter   . Other Daughter     Trichotrilomania  . Aneurysm Maternal Grandmother     brain aneurysm  . Diabetes Paternal Grandmother   . Hypertension Paternal Grandmother   . Cancer Paternal Grandmother     colon  . Crohn's disease Paternal Grandfather   . Emphysema Brother     smoker  . Heart disease Brother   . Diabetes Brother   . Cancer Brother     thyroid  . Coronary artery disease Brother     s/p MI's and 7 stents  . Diabetes Brother   . Other Brother     back disease w/ bulging discs  . Colon polyps Brother     esophageal and tongue polyps  . Diabetes Sister   . Other Sister     short term memory loss  .  Aneurysm Sister     brain aneursm s/p coiling  . Diabetes Son   . Anxiety disorder Son     History   Social History  . Marital Status: Married    Spouse Name: N/A    Number of Children: N/A  . Years of Education: N/A   Occupational History  . retired Geologist, engineering    Social History Main Topics  . Smoking status: Never Smoker   . Smokeless tobacco: Never Used  . Alcohol Use: No  . Drug Use: No  . Sexually Active: Yes -- Female partner(s)   Other Topics Concern  . Not on file   Social History Narrative  . No narrative on file    Current Outpatient Prescriptions on File Prior to Visit  Medication Sig Dispense Refill  . amLODipine-benazepril (LOTREL) 5-20 MG per capsule Take 1 capsule by mouth daily.  90 capsule  1  . aspirin 81 MG tablet Take 81 mg by mouth 2 (two) times daily.       . Blood Glucose Monitoring Suppl (ONE TOUCH ULTRA MINI) W/DEVICE KIT Check BS qam and as needed  1 each  3    . carvedilol (COREG) 12.5 MG tablet Take 0.5 tablets (6.25 mg total) by mouth 2 (two) times daily.  180 tablet  1  . Cholecalciferol (VITAMIN D-3 PO) Take 2 tablets by mouth daily.       . citalopram (CELEXA) 40 MG tablet       . clopidogrel (PLAVIX) 75 MG tablet Take 1 tablet (75 mg total) by mouth daily.  90 tablet  2  . escitalopram (LEXAPRO) 20 MG tablet Take 2 tablets (40 mg total) by mouth daily.  60 tablet  5  . ezetimibe-simvastatin (VYTORIN) 10-40 MG per tablet Take 1 tablet by mouth at bedtime.  90 tablet  1  . gabapentin (NEURONTIN) 300 MG capsule Take 2 capsules (600 mg total) by mouth 2 (two) times daily.  360 capsule  1  . glucose blood test strip Check in qam and as needed  100 each  3  . isosorbide mononitrate (IMDUR) 60 MG 24 hr tablet Take 1 tablet (60 mg total) by mouth daily.  90 tablet  1  . levothyroxine (SYNTHROID, LEVOTHROID) 25 MCG tablet Take 1 tablet (25 mcg total) by mouth daily.  90 tablet  2  . Liraglutide (VICTOZA) 18 MG/3ML SOLN Inject 0.2 mLs (1.2 mg total) into the skin daily. 0.6 mg [place] daily x 7 days and then increase to 1.2 mg [place] daily  6 mL  3  . metformin (FORTAMET) 1000 MG (OSM) 24 hr tablet Take 2 tablets (2,000 mg total) by mouth daily with breakfast.  60 tablet  2  . Milnacipran (SAVELLA) 50 MG TABS Take 50 mg by mouth 2 (two) times daily.      . ONE TOUCH LANCETS MISC Use every am and as needed  200 each  3  . pantoprazole (PROTONIX) 40 MG tablet Take 1 tablet (40 mg total) by mouth daily.  90 tablet  2  . Probiotic Product (PROBIOTIC FORMULA PO) Take by mouth.        . sucralfate (CARAFATE) 1 G tablet Take 1 tablet (1 g total) by mouth as needed.  90 tablet  1  . estrogens, conjugated, (PREMARIN) 0.3 MG tablet Take 1 tablet (0.3 mg total) by mouth daily.  30 tablet  1  . phenazopyridine (PYRIDIUM) 200 MG tablet Take 1 tablet (200 mg total) by mouth 3 (  three) times daily as needed for pain.  10 tablet  0  . promethazine (PHENERGAN) 25 MG tablet Take 1  tablet (25 mg total) by mouth every 6 (six) hours as needed for nausea.  30 tablet  0  . ranitidine (ZANTAC) 300 MG tablet Take 1 tablet (300 mg total) by mouth at bedtime.  300 tablet  3    Allergies  Allergen Reactions  . Percocet (Oxycodone-Acetaminophen) Itching    Review of Systems  Review of Systems  Constitutional: Positive for fever, chills, malaise/fatigue and diaphoresis.  HENT: Negative for congestion.   Eyes: Negative for discharge.  Respiratory: Negative for shortness of breath.   Cardiovascular: Negative for chest pain, palpitations and leg swelling.  Gastrointestinal: Positive for abdominal pain. Negative for nausea and diarrhea.  Genitourinary: Positive for urgency, frequency and flank pain. Negative for dysuria.  Musculoskeletal: Positive for back pain. Negative for falls.  Skin: Negative for rash.  Neurological: Negative for loss of consciousness and headaches.  Endo/Heme/Allergies: Negative for polydipsia.  Psychiatric/Behavioral: Positive for depression. Negative for suicidal ideas. The patient is not nervous/anxious and does not have insomnia.     Objective  BP 132/76  Pulse 91  Temp 97.8 F (36.6 C) (Temporal)  Ht 5\' 1"  (1.549 m)  Wt 249 lb 12.8 oz (113.309 kg)  BMI 47.20 kg/m2  SpO2 93%  Physical Exam  Physical Exam  Constitutional: She is oriented to person, place, and time and well-developed, well-nourished, and in no distress. No distress.       obese  HENT:  Head: Normocephalic and atraumatic.  Eyes: Conjunctivae normal are normal.  Neck: Neck supple. No thyromegaly present.  Cardiovascular: Normal rate, regular rhythm and normal heart sounds.   Pulmonary/Chest: Effort normal and breath sounds normal. She has no wheezes.  Abdominal: She exhibits no distension and no mass.  Musculoskeletal: She exhibits no edema.  Lymphadenopathy:    She has no cervical adenopathy.  Neurological: She is alert and oriented to person, place, and time.    Skin: Skin is warm and dry. No rash noted. She is not diaphoretic.  Psychiatric: Memory, affect and judgment normal.    Lab Results  Component Value Date   TSH 3.36 04/06/2012   Lab Results  Component Value Date   WBC 10.2 04/06/2012   HGB 11.8* 04/06/2012   HCT 36.0 04/06/2012   MCV 92.9 04/06/2012   PLT 232.0 04/06/2012   Lab Results  Component Value Date   CREATININE 1.0 04/06/2012   BUN 25* 04/06/2012   NA 140 04/06/2012   K 4.1 04/06/2012   CL 101 04/06/2012   CO2 31 04/06/2012   Lab Results  Component Value Date   ALT 21 04/06/2012   AST 19 04/06/2012   ALKPHOS 82 04/06/2012   BILITOT 0.5 04/06/2012   Lab Results  Component Value Date   CHOL 197 04/06/2012   Lab Results  Component Value Date   HDL 45.80 04/06/2012   Lab Results  Component Value Date   LDLCALC 114* 04/06/2012   Lab Results  Component Value Date   TRIG 187.0* 04/06/2012   Lab Results  Component Value Date   CHOLHDL 4 04/06/2012     Assessment & Plan  ESSENTIAL HYPERTENSION, BENIGN Well controlled despite pain, no changes  Abdominal  pain, other specified site Likely related to kidney stone and UTI, passed stone in office with improvement in symptoms. To her plethora of symptoms and concerns Will go ahead and proceed with CT abdomen and  pelvis which showed nothing more than a coin in her abdomen and some diverticulosis but no diverticulitis. Will treat UTI and awake kidney stone analysis.  DIVERTICULAR DISEASE No diverticulitis on CT scan  DM Improved control recently no changes needed  UTI (lower urinary tract infection) Treated with antibiotics and increased fluids. May use cranberry when necessary and given a prescription for Pyridium for pain.

## 2012-05-07 LAB — STONE ANALYSIS: Stone Weight KSTONE: 0.005 g

## 2012-05-07 NOTE — Progress Notes (Signed)
Quick Note:  Patients spouse informed and voiced understanding ______

## 2012-05-12 ENCOUNTER — Telehealth: Payer: Self-pay | Admitting: Family Medicine

## 2012-05-12 DIAGNOSIS — E119 Type 2 diabetes mellitus without complications: Secondary | ICD-10-CM

## 2012-05-12 NOTE — Telephone Encounter (Signed)
Please advise 

## 2012-05-12 NOTE — Telephone Encounter (Signed)
pts husband informed and states they will make the changes and call back to schedule the appt

## 2012-05-12 NOTE — Telephone Encounter (Signed)
victoza can increase to 1.8 mcg daily go ahead and have her make that change now and follow up in next week or 2 call with any concerns

## 2012-05-13 ENCOUNTER — Other Ambulatory Visit: Payer: Self-pay | Admitting: Family Medicine

## 2012-05-17 ENCOUNTER — Telehealth: Payer: Self-pay | Admitting: Family Medicine

## 2012-05-17 MED ORDER — LIRAGLUTIDE 18 MG/3ML ~~LOC~~ SOLN: 1.8000 mg | Freq: Every day | SUBCUTANEOUS | Status: DC

## 2012-05-17 NOTE — Telephone Encounter (Signed)
Please advise dosage and strength?

## 2012-05-17 NOTE — Telephone Encounter (Signed)
victoza is 1.8 mg [place] daily, disp 30 day supply with 3 rf

## 2012-06-03 ENCOUNTER — Telehealth: Payer: Self-pay | Admitting: Family Medicine

## 2012-06-03 ENCOUNTER — Other Ambulatory Visit: Payer: Self-pay | Admitting: Family Medicine

## 2012-06-03 DIAGNOSIS — K635 Polyp of colon: Secondary | ICD-10-CM

## 2012-06-03 NOTE — Telephone Encounter (Signed)
Patient is due for her colonoscopy. She had her last one 3 years ago at [facility]. Patient is requesting Dr. Abner Greenspan to recommend a [facility] doctor that would be a good fit for patient and make a referral. Patient is requesting a February appt.

## 2012-06-03 NOTE — Telephone Encounter (Signed)
Please advise 

## 2012-06-03 NOTE — Telephone Encounter (Signed)
I will set her up with [facility],

## 2012-06-04 ENCOUNTER — Encounter: Payer: Self-pay | Admitting: Physician Assistant

## 2012-06-07 ENCOUNTER — Telehealth: Payer: Self-pay | Admitting: Family Medicine

## 2012-06-07 MED ORDER — LIRAGLUTIDE 18 MG/3ML ~~LOC~~ SOLN: 1.8000 mg | Freq: Every day | SUBCUTANEOUS | Status: DC

## 2012-06-07 NOTE — Telephone Encounter (Signed)
Pt informed

## 2012-06-07 NOTE — Telephone Encounter (Signed)
Sample in fridge for patient. New RX sent to pharmacy

## 2012-06-09 ENCOUNTER — Ambulatory Visit (INDEPENDENT_AMBULATORY_CARE_PROVIDER_SITE_OTHER): Payer: Medicare Other | Admitting: Physician Assistant

## 2012-06-09 ENCOUNTER — Encounter: Payer: Self-pay | Admitting: Physician Assistant

## 2012-06-09 VITALS — BP 132/64 | HR 90 | Ht 61.0 in | Wt 253.0 lb

## 2012-06-09 DIAGNOSIS — Z86010 Personal history of colonic polyps: Secondary | ICD-10-CM

## 2012-06-09 DIAGNOSIS — Z800 Family history of malignant neoplasm of digestive organs: Secondary | ICD-10-CM

## 2012-06-09 DIAGNOSIS — Z7901 Long term (current) use of anticoagulants: Secondary | ICD-10-CM

## 2012-06-09 MED ORDER — PEG-KCL-NACL-NASULF-NA ASC-C 100 G PO SOLR: 1.0000 | Freq: Once | ORAL | Status: AC

## 2012-06-09 NOTE — Progress Notes (Signed)
Reviewed. Will proceed with colonoscopy, last stent placed  8 years ago. Dr Tresa Endo to [facility] holding Plavix

## 2012-06-09 NOTE — Patient Instructions (Addendum)
We sent a prescription for the colonoscopy prep to [facility] [address]  You have been scheduled for a colonoscopy with propofol. Please follow written instructions given to you at your visit today.  Please pick up your prep kit at the pharmacy within the next 1-3 days. If you use inhalers (even only as needed) or a CPAP machine, please bring them with you on the day of your procedure.  We will call you after we hear back from  Dr. Tresa Endo from [facility].

## 2012-06-09 NOTE — Progress Notes (Signed)
Subjective:    Patient ID: Amber [name], female    DOB: 1945-08-21, 67 y.o.   MRN: [id number]  HPI  Amber [name] is a very nice 67 year old white female who comes in today to discuss followup colonoscopy. She is a primary patient of Dr. Darrow Bussing and once to have all of her medical care consolidated . She has multiple significant medical problems including morbid obesity, sleep apnea with CPAP use and nighttime oxygen use. Insulin-dependent diabetes mellitus coronary artery disease status post stents x3 last done 2006. She is maintained on Plavix and aspirin and followed by Dr. Tresa Endo from a cardiac standpoint. She also has history of hypertension and peripheral neuropathy. She last had colonoscopy by Dr. Dorena Cookey in January of 2011 and had 3 sessile polyps found in the transverse colon all 4-6 mm in size and one 8 mm polyp in the rectum also noted diverticulosis in the sigmoid colon and scattered throughout the remainder of the colon. Path showed one tubular adenoma 2 hyperplastic polyps in the rectal polyp was also hyperplastic. She was recommended to have 3 year followup. Her family history is positive for colon cancer in her sister . She currently has no complaints. Specifically, no complaints of abdominal pain, no changes in her bowel habits no melena or hematochezia. Appetite has been fine weight has been stable no dysphagia heartburn or indigestion.    Review of Systems  Constitutional: Negative.   HENT: Negative.   Eyes: Negative.   Respiratory: Negative.   Cardiovascular: Negative.   Gastrointestinal: Negative.   Genitourinary: Negative.   Musculoskeletal: Positive for back pain.  Neurological: Negative.   Psychiatric/Behavioral: Negative.    Outpatient Prescriptions Prior to Visit  Medication Sig Dispense Refill  . amLODipine-benazepril (LOTREL) 5-20 MG per capsule Take 1 capsule by mouth daily.  90 capsule  1  . aspirin 81 MG tablet Take 81 mg by mouth 2 (two) times daily.        . Blood Glucose Monitoring Suppl (ONE TOUCH ULTRA MINI) W/DEVICE KIT Check BS qam and as needed  1 each  3  . carvedilol (COREG) 12.5 MG tablet Take 0.5 tablets (6.25 mg total) by mouth 2 (two) times daily.  180 tablet  1  . Cholecalciferol (VITAMIN D-3 PO) Take 2 tablets by mouth daily.       . citalopram (CELEXA) 40 MG tablet       . clopidogrel (PLAVIX) 75 MG tablet Take 1 tablet (75 mg total) by mouth daily.  90 tablet  2  . estrogens, conjugated, (PREMARIN) 0.3 MG tablet Take 1 tablet (0.3 mg total) by mouth daily.  30 tablet  1  . ezetimibe-simvastatin (VYTORIN) 10-40 MG per tablet Take 1 tablet by mouth at bedtime.  90 tablet  1  . gabapentin (NEURONTIN) 300 MG capsule Take 2 capsules (600 mg total) by mouth 2 (two) times daily.  360 capsule  1  . glucose blood test strip Check in qam and as needed  100 each  3  . isosorbide mononitrate (IMDUR) 60 MG 24 hr tablet TAKE 1 TABLET EVERY DAY  90 tablet  1  . levothyroxine (SYNTHROID, LEVOTHROID) 25 MCG tablet Take 1 tablet (25 mcg total) by mouth daily.  90 tablet  2  . Liraglutide (VICTOZA) 18 MG/3ML SOLN injection Inject 0.3 mLs (1.8 mg total) into the skin daily.  6 mL  3  . metformin (FORTAMET) 1000 MG (OSM) 24 hr tablet Take 2 tablets (2,000 mg total) by mouth daily with  breakfast.  60 tablet  2  . Milnacipran (SAVELLA) 50 MG TABS Take 50 mg by mouth 2 (two) times daily.      . ONE TOUCH LANCETS MISC Use every am and as needed  200 each  3  . pantoprazole (PROTONIX) 40 MG tablet Take 1 tablet (40 mg total) by mouth daily.  90 tablet  2  . phenazopyridine (PYRIDIUM) 200 MG tablet Take 1 tablet (200 mg total) by mouth 3 (three) times daily as needed for pain.  10 tablet  0  . Probiotic Product (PROBIOTIC FORMULA PO) Take by mouth.        . promethazine (PHENERGAN) 25 MG tablet Take 1 tablet (25 mg total) by mouth every 6 (six) hours as needed for nausea.  30 tablet  0  . ranitidine (ZANTAC) 300 MG tablet Take 1 tablet (300 mg total) by  mouth at bedtime.  300 tablet  3  . sucralfate (CARAFATE) 1 G tablet Take 1 tablet (1 g total) by mouth as needed.  90 tablet  1  . [DISCONTINUED] ciprofloxacin (CIPRO) 500 MG tablet Take 1 tablet (500 mg total) by mouth 2 (two) times daily.  14 tablet  0  . [DISCONTINUED] oxyCODONE-acetaminophen (PERCOCET) 10-325 MG per tablet Take 1 tablet by mouth every 8 (eight) hours as needed for pain.  45 tablet  0   Last reviewed on 06/09/2012  2:28 PM by Sammuel Cooper, PA  Allergies  Allergen Reactions  . Percocet (Oxycodone-Acetaminophen) Itching     Patient Active Problem List  Diagnosis  . COLONIC POLYPS  . Unspecified hypothyroidism  . DM  . HYPERLIPIDEMIA  . MORBID OBESITY  . ANEMIA  . ANXIETY DEPRESSION  . PERIPHERAL NEUROPATHY  . ESSENTIAL HYPERTENSION, BENIGN  . CAD  . MITRAL VALVE PROLAPSE  . DIVERTICULAR DISEASE  . UNSPECIFIED MYALGIA AND MYOSITIS  . SLEEP APNEA  . 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 Hypoxia  . Left eye pain  . Skin lesion of back  . Viral infection characterized by skin and mucous membrane lesions  . Dyspnea  . Vitamin D deficiency  . Back pain  . Motion sickness  . UTI (lower urinary tract infection)  . Hot flashes  . Bronchitis  . Abdominal  pain, other specified site     History  Substance Use Topics  . Smoking status: Never Smoker   . Smokeless tobacco: Never Used  . Alcohol Use: No   family history includes Aneurysm in her maternal grandmother and sister; Anxiety disorder in her son; Cancer in her brother, paternal grandmother, and sister; Colon polyps in her brother; Coronary artery disease in her brother; Crohn's disease in her paternal grandfather; Depression in her daughter and sister; Diabetes in her brothers, daughter, paternal grandmother, sisters, and son; Diverticulitis in her daughter; Emphysema in her brothers; Heart disease in her brother and father; Hypertension in her daughter, paternal grandmother, and sister; and  Other in her brother, daughter, and sister.  Objective:   Physical Exam no well-developed morbidly obese white female in no acute distress, pleasant company by her husband. Blood pressure 132/64 pulse 90 height 5 foot 1 weight 253 (BMI 48). HEENT; nontraumatic, normocephalic, EOMI PERRLA sclera anicteric,Neck; Supple no JVD, Cardiovascular; regular rate and rhythm with S1-S2 no significant murmur rub or gallop, Pulmonary; clear bilaterally, Abdomen; morbidly obese, soft ,nontender ,no palpable mass or hepatosplenomegaly bowel sounds are active Incisional scar, Rectal; exam not done, Extremities; trace edema bilateral feet and ankles, Psych;mood and  affect normal and appropriate        Assessment & Plan:  #30 77 -year-old female with family history of colon cancer in a first degree relative and personal history of adenomatous colon polyps, last colonoscopy 3 years ago and was recommended for a three-year followup. #2 chronic antiplatelet therapy with Plavix and aspirin #3 coronary artery disease status post stents x3 2006 #4 insulin-dependent diabetes mellitus #5 morbid obesity #6 sleep apnea and probable obesity hypoventilation syndrome with nighttime 02 use. #7 diverticulosis  Plan; schedule for colonoscopy with Dr. Lina Sar. This will be scheduled at the hospital with propofol. Procedure was discussed in detail with the patient and her husband and they are agreeable to proceed. She will need to come off of her Plavix release 5 days prior to the procedure and will obtain consent from Dr. Tresa Endo her cardiologist to hold the Plavix. Relative risks of holding antiplatelet therapy were discussed

## 2012-06-10 ENCOUNTER — Encounter (HOSPITAL_COMMUNITY): Payer: Self-pay | Admitting: *Deleted

## 2012-06-10 ENCOUNTER — Other Ambulatory Visit: Payer: Self-pay | Admitting: Family Medicine

## 2012-06-10 NOTE — Pre-Procedure Instructions (Signed)
Your procedure is scheduled XB:[id number], June 29, 2012 Report to [facility] Admitting [id number] Call this number if you have problems morning of your procedure:[phone]  Follow all bowel prep instructions per your doctor's orders.  Do not eat or drink anything after midnight the night before your procedure. You may brush your teeth, rinse out your mouth, but no water, no food, no chewing gum, no mints, no candies, no chewing tobacco.  No Metformin the morning of the procedure     Take these medicines the morning of your procedure with A SIP OF WATER:Protonix,Isosorbide, Coreg and Lotrel   Please make arrangements for a responsible person to drive you home after the procedure. You cannot go home by cab/taxi. We recommend you have someone with you at home the first 24 hours after your procedure. Driver for procedure is spouse Purity Irmen [phone]  LEAVE ALL VALUABLES, JEWELRY, BILLFOLD AT HOME.  NO DENTURES, CONTACT LENSES ALLOWED IN THE ENDOSCOPY ROOM.   YOU MAY WEAR DEODORANT, PLEASE REMOVE ALL JEWELRY, WATCHES RINGS, BODY PIERCINGS AND LEAVE AT HOME.   WOMEN: NO MAKE-UP, LOTIONS PERFUMES   Called [facility] office for EKG, office visit notes and any diagnosic studies. 1656 EKG,office note and diagnostic studies on the chart.

## 2012-06-14 ENCOUNTER — Emergency Department (HOSPITAL_BASED_OUTPATIENT_CLINIC_OR_DEPARTMENT_OTHER): Payer: Medicare Other

## 2012-06-14 ENCOUNTER — Ambulatory Visit (INDEPENDENT_AMBULATORY_CARE_PROVIDER_SITE_OTHER): Payer: Medicare Other | Admitting: Family Medicine

## 2012-06-14 ENCOUNTER — Encounter: Payer: Self-pay | Admitting: Family Medicine

## 2012-06-14 ENCOUNTER — Emergency Department (HOSPITAL_BASED_OUTPATIENT_CLINIC_OR_DEPARTMENT_OTHER)
Admission: EM | Admit: 2012-06-14 | Discharge: 2012-06-14 | Disposition: A | Discharge status: Home / Self Care | Payer: Medicare Other | Attending: Emergency Medicine | Admitting: Emergency Medicine

## 2012-06-14 ENCOUNTER — Encounter (HOSPITAL_BASED_OUTPATIENT_CLINIC_OR_DEPARTMENT_OTHER): Payer: Self-pay | Admitting: Emergency Medicine

## 2012-06-14 VITALS — BP 81/53 | HR 98 | Temp 97.7°F | Wt 249.0 lb

## 2012-06-14 DIAGNOSIS — N951 Menopausal and female climacteric states: Secondary | ICD-10-CM | POA: Insufficient documentation

## 2012-06-14 DIAGNOSIS — M129 Arthropathy, unspecified: Secondary | ICD-10-CM | POA: Insufficient documentation

## 2012-06-14 DIAGNOSIS — R0989 Other specified symptoms and signs involving the circulatory and respiratory systems: Secondary | ICD-10-CM

## 2012-06-14 DIAGNOSIS — N390 Urinary tract infection, site not specified: Secondary | ICD-10-CM | POA: Insufficient documentation

## 2012-06-14 DIAGNOSIS — R0602 Shortness of breath: Secondary | ICD-10-CM

## 2012-06-14 DIAGNOSIS — R109 Unspecified abdominal pain: Secondary | ICD-10-CM | POA: Insufficient documentation

## 2012-06-14 DIAGNOSIS — I10 Essential (primary) hypertension: Secondary | ICD-10-CM | POA: Insufficient documentation

## 2012-06-14 DIAGNOSIS — Z79899 Other long term (current) drug therapy: Secondary | ICD-10-CM | POA: Insufficient documentation

## 2012-06-14 DIAGNOSIS — Z9861 Coronary angioplasty status: Secondary | ICD-10-CM

## 2012-06-14 DIAGNOSIS — J189 Pneumonia, unspecified organism: Secondary | ICD-10-CM | POA: Insufficient documentation

## 2012-06-14 DIAGNOSIS — M549 Dorsalgia, unspecified: Secondary | ICD-10-CM | POA: Insufficient documentation

## 2012-06-14 DIAGNOSIS — J9801 Acute bronchospasm: Secondary | ICD-10-CM

## 2012-06-14 DIAGNOSIS — I2510 Atherosclerotic heart disease of native coronary artery without angina pectoris: Secondary | ICD-10-CM

## 2012-06-14 DIAGNOSIS — L989 Disorder of the skin and subcutaneous tissue, unspecified: Secondary | ICD-10-CM | POA: Insufficient documentation

## 2012-06-14 DIAGNOSIS — B088 Other specified viral infections characterized by skin and mucous membrane lesions: Secondary | ICD-10-CM | POA: Insufficient documentation

## 2012-06-14 DIAGNOSIS — E785 Hyperlipidemia, unspecified: Secondary | ICD-10-CM | POA: Insufficient documentation

## 2012-06-14 DIAGNOSIS — R0902 Hypoxemia: Secondary | ICD-10-CM | POA: Insufficient documentation

## 2012-06-14 DIAGNOSIS — F3289 Other specified depressive episodes: Secondary | ICD-10-CM | POA: Insufficient documentation

## 2012-06-14 DIAGNOSIS — I219 Acute myocardial infarction, unspecified: Secondary | ICD-10-CM | POA: Insufficient documentation

## 2012-06-14 DIAGNOSIS — IMO0001 Reserved for inherently not codable concepts without codable children: Secondary | ICD-10-CM | POA: Insufficient documentation

## 2012-06-14 DIAGNOSIS — E119 Type 2 diabetes mellitus without complications: Secondary | ICD-10-CM | POA: Insufficient documentation

## 2012-06-14 DIAGNOSIS — E079 Disorder of thyroid, unspecified: Secondary | ICD-10-CM | POA: Insufficient documentation

## 2012-06-14 DIAGNOSIS — J40 Bronchitis, not specified as acute or chronic: Secondary | ICD-10-CM | POA: Insufficient documentation

## 2012-06-14 DIAGNOSIS — E039 Hypothyroidism, unspecified: Secondary | ICD-10-CM | POA: Insufficient documentation

## 2012-06-14 DIAGNOSIS — G4730 Sleep apnea, unspecified: Secondary | ICD-10-CM | POA: Insufficient documentation

## 2012-06-14 DIAGNOSIS — F329 Major depressive disorder, single episode, unspecified: Secondary | ICD-10-CM | POA: Insufficient documentation

## 2012-06-14 DIAGNOSIS — R011 Cardiac murmur, unspecified: Secondary | ICD-10-CM | POA: Insufficient documentation

## 2012-06-14 DIAGNOSIS — D126 Benign neoplasm of colon, unspecified: Secondary | ICD-10-CM | POA: Insufficient documentation

## 2012-06-14 MED ORDER — ALBUTEROL SULFATE HFA 108 (90 BASE) MCG/ACT IN AERS: 2.0000 | INHALATION_SPRAY | RESPIRATORY_TRACT | Status: DC | PRN

## 2012-06-14 MED ORDER — AZITHROMYCIN 250 MG PO TABS: ORAL_TABLET | ORAL | Status: DC

## 2012-06-14 MED ORDER — IPRATROPIUM-ALBUTEROL 0.5-2.5 (3) MG/3ML IN SOLN
3.0000 mL | Freq: Once | RESPIRATORY_TRACT | Status: AC
  Administered 2012-06-14: 3 mL via RESPIRATORY_TRACT

## 2012-06-14 MED ORDER — ALBUTEROL SULFATE (5 MG/ML) 0.5% IN NEBU
10.0000 mg | INHALATION_SOLUTION | Freq: Once | RESPIRATORY_TRACT | Status: AC
  Administered 2012-06-14: 10 mg via RESPIRATORY_TRACT
  Filled 2012-06-14: qty 2

## 2012-06-14 MED ORDER — PREDNISONE 50 MG PO TABS
60.0000 mg | ORAL_TABLET | Freq: Once | ORAL | Status: AC
  Administered 2012-06-14: 60 mg via ORAL
  Filled 2012-06-14: qty 1

## 2012-06-14 MED ORDER — ALBUTEROL SULFATE (5 MG/ML) 0.5% IN NEBU
5.0000 mg | INHALATION_SOLUTION | Freq: Once | RESPIRATORY_TRACT | Status: AC
  Administered 2012-06-14: 5 mg via RESPIRATORY_TRACT
  Filled 2012-06-14: qty 1

## 2012-06-14 MED ORDER — CEPHALEXIN 500 MG PO CAPS: ORAL_CAPSULE | ORAL | Status: DC

## 2012-06-14 MED ORDER — AEROCHAMBER PLUS W/MASK MISC: Status: DC

## 2012-06-14 MED ORDER — PREDNISONE 20 MG PO TABS: ORAL_TABLET | ORAL | Status: DC

## 2012-06-14 MED ORDER — IPRATROPIUM BROMIDE 0.02 % IN SOLN
0.5000 mg | Freq: Once | RESPIRATORY_TRACT | Status: AC
  Administered 2012-06-14: 0.5 mg via RESPIRATORY_TRACT
  Filled 2012-06-14: qty 2.5

## 2012-06-14 MED ORDER — ALBUTEROL SULFATE HFA 108 (90 BASE) MCG/ACT IN AERS
2.0000 | INHALATION_SPRAY | Freq: Once | RESPIRATORY_TRACT | Status: DC
  Filled 2012-06-14: qty 6.7

## 2012-06-14 NOTE — ED Notes (Signed)
Patient transported to X-ray 

## 2012-06-14 NOTE — ED Provider Notes (Signed)
History     CSN: [id number]  Arrival date & time 06/14/12  1152   First MD Initiated Contact with Patient 06/14/12 1308      Chief Complaint  Patient presents with  . Cough  . Fever    (Consider location/radiation/quality/duration/timing/severity/associated sxs/prior treatment) HPI This 67 year old female has had about 10 days of what sounds a viral syndrome with some generalized body aches cough, she said a few days of fever, she did vomit once within the last few days is no nausea vomiting today, she was seen by her primary care Dr. who realize the patient was wheezing and short breath today systems the patient to the ED for further evaluation for chest x-ray and to determine whether or not the patient is to be admitted, the patient does feel improved but not resolved with an albuterol nebulizer from her primary care Dr. as well as one upon arrival to the ED here today, she is no confusion no lightheadedness no vomiting no chest pain no abdominal pain no edema, she is on home oxygen already, her baseline room air pulse oximetry is 88% and that is what she was to your primary care doctors office prior to arrival, she is in the mid 90s on her nasal cannula oxygen which is also baseline for her, she does not want to be hospitalized if possible, treatment prior to arrival included albuterol nebulizer from her primary care Dr. Past Medical History  Diagnosis Date  . Thyroid disease   . Hyperlipidemia   . Hypertension     5 stents  . Arthritis   . Diabetes mellitus     type 2  . Hypoxia 10/07/2010  . Skin lesion of back 10/18/2010  . Viral infection characterized by skin and mucous membrane lesions 10/18/2010  . Vitamin D deficiency 11/21/2010  . Back pain 11/21/2010  . Motion sickness 12/04/2010  . UTI (lower urinary tract infection) 09/08/2011  . Hot flashes 10/06/2011  . Bronchitis 02/28/2012  . Abdominal  pain, other specified site 05/05/2012  . Tubular adenoma of colon 2011  . Hypothyroidism    . Depression   . Sleep apnea     uses cpap bring mask and tubing for procedure  . Myocardial infarction   . Heart murmur     MVP  . Fibromyalgia     Past Surgical History  Procedure Laterality Date  . Angioplasty  1980  . Angioplasty  2006    3 stents  . Angioplasty  2009    2 stents  . Cholecystectomy    . Thyroidectomy  1950    for goiter  . Diverticulitis  1978    exploratory  . Right rotator cuff repair  2004  . Left rotator cuff repair  2004  . Bone fusion left foot  2010    7 screws in foot, bunionectomy  . Hip surgery      large bone spur removed, right  . Back surgery  2004    lumbar discectomy for bulging disc  . Abdominal hysterectomy  1977    partial    Family History  Problem Relation Age of Onset  . Heart disease Father   . Diabetes Sister   . Hypertension Sister   . Depression Sister   . Cancer Sister     colon cancer s/p colectomy  . Emphysema Brother     smoker  . Diabetes Daughter   . Hypertension Daughter   . Depression Daughter   . Diverticulitis Daughter   .  Other Daughter     Trichotrilomania  . Aneurysm Maternal Grandmother     brain aneurysm  . Diabetes Paternal Grandmother   . Hypertension Paternal Grandmother   . Cancer Paternal Grandmother     colon  . Crohn's disease Paternal Grandfather   . Emphysema Brother     smoker  . Heart disease Brother   . Diabetes Brother   . Cancer Brother     thyroid  . Coronary artery disease Brother     s/p MI's and 7 stents  . Diabetes Brother   . Other Brother     back disease w/ bulging discs  . Colon polyps Brother     esophageal and tongue polyps  . Diabetes Sister   . Other Sister     short term memory loss  . Aneurysm Sister     brain aneursm s/p coiling  . Diabetes Son   . Anxiety disorder Son     History  Substance Use Topics  . Smoking status: Never Smoker   . Smokeless tobacco: Never Used  . Alcohol Use: No    OB History   Grav Para Term Preterm Abortions TAB  SAB Ect Mult Living                  Review of Systems 10 Systems reviewed and are negative for acute change except as noted in the HPI. Allergies  Percocet  Home Medications   Current Outpatient Rx  Name  Route  Sig  Dispense  Refill  . albuterol (PROVENTIL HFA;VENTOLIN HFA) 108 (90 BASE) MCG/ACT inhaler   Inhalation   Inhale 2 puffs into the lungs every 2 (two) hours as needed for wheezing or shortness of breath (cough).   1 Inhaler   0   . amLODipine-benazepril (LOTREL) 5-20 MG per capsule      TAKE ONE CAPSULE EVERY DAY   90 capsule   0   . aspirin 81 MG tablet   Oral   Take 81 mg by mouth 2 (two) times daily.          Marland Kitchen azithromycin (ZITHROMAX Z-PAK) 250 MG tablet      2 po day one, then 1 daily x 4 days   5 tablet   0   . Blood Glucose Monitoring Suppl (ONE TOUCH ULTRA MINI) W/DEVICE KIT      Check BS qam and as needed   1 each   3   . carvedilol (COREG) 12.5 MG tablet      TAKE 1/2 TABLET TWICE A DAY   180 tablet   0   . cephALEXin (KEFLEX) 500 MG capsule      2 caps po bid x 7 days   28 capsule   0   . chlorpheniramine-HYDROcodone (TUSSIONEX PENNKINETIC ER) 10-8 MG/5ML LQCR   Oral   Take 5 mLs by mouth at bedtime as needed.   140 mL   1   . Cholecalciferol (VITAMIN D-3 PO)   Oral   Take 2 tablets by mouth daily.          . clopidogrel (PLAVIX) 75 MG tablet   Oral   Take 1 tablet (75 mg total) by mouth daily.   90 tablet   2   . escitalopram (LEXAPRO) 20 MG tablet   Oral   Take 2 tablets by mouth daily.         Marland Kitchen estrogens, conjugated, (PREMARIN) 0.3 MG tablet   Oral   Take 1 tablet (  0.3 mg total) by mouth daily.   30 tablet   1   . ezetimibe-simvastatin (VYTORIN) 10-40 MG per tablet   Oral   Take 1 tablet by mouth at bedtime.   90 tablet   1   . gabapentin (NEURONTIN) 300 MG capsule      TAKE 2 CAPSULES TWICE A DAY   360 capsule   1   . glucose blood test strip      Check in qam and as needed   100 each    3   . isosorbide mononitrate (IMDUR) 60 MG 24 hr tablet      TAKE 1 TABLET EVERY DAY   90 tablet   1   . Krill Oil 300 MG CAPS   Oral   Take 1 capsule by mouth daily.         Marland Kitchen levothyroxine (SYNTHROID, LEVOTHROID) 25 MCG tablet   Oral   Take 1 tablet (25 mcg total) by mouth daily.   90 tablet   2   . Liraglutide (VICTOZA) 18 MG/3ML SOLN injection   Subcutaneous   Inject 0.3 mLs (1.8 mg total) into the skin daily.   6 mL   3   . metformin (FORTAMET) 1000 MG (OSM) 24 hr tablet      TAKE 2 TABLETS BY MOUTH EVERY DAY WITH BREAKFAST   60 tablet   2   . Milnacipran (SAVELLA) 50 MG TABS   Oral   Take 50 mg by mouth 2 (two) times daily.         . ONE TOUCH LANCETS MISC      Use every am and as needed   200 each   3   . pantoprazole (PROTONIX) 40 MG tablet   Oral   Take 1 tablet (40 mg total) by mouth daily.   90 tablet   2   . phenazopyridine (PYRIDIUM) 200 MG tablet   Oral   Take 1 tablet (200 mg total) by mouth 3 (three) times daily as needed for pain.   10 tablet   0   . predniSONE (DELTASONE) 20 MG tablet      2 tabs po daily x 4 days   8 tablet   0   . Probiotic Product (PROBIOTIC FORMULA PO)   Oral   Take by mouth.           . promethazine (PHENERGAN) 25 MG tablet   Oral   Take 1 tablet (25 mg total) by mouth every 6 (six) hours as needed for nausea.   30 tablet   0   . ranitidine (ZANTAC) 300 MG tablet   Oral   Take 1 tablet (300 mg total) by mouth at bedtime.   300 tablet   3   . Spacer/Aero-Holding Chambers (AEROCHAMBER PLUS WITH MASK) inhaler      Use as instructed   1 each   2   . sucralfate (CARAFATE) 1 G tablet   Oral   Take 1 tablet (1 g total) by mouth as needed.   90 tablet   1     BP 95/52  Pulse 91  Temp(Src) 98.5 F (36.9 C) (Oral)  Resp 24  Ht 5\' 1"  (1.549 m)  Wt 249 lb (112.946 kg)  BMI 47.07 kg/m2  SpO2 84%  Physical Exam  Nursing note and vitals reviewed. Constitutional:  Awake, alert, nontoxic  appearance.  HENT:  Head: Atraumatic.  Eyes: Right eye exhibits no discharge. Left eye exhibits no discharge.  Neck: Neck supple.  Cardiovascular: Normal rate and regular rhythm.   No murmur heard. Pulmonary/Chest: She is in respiratory distress. She has wheezes. She has no rales. She exhibits no tenderness.  Patient is able to speak full sentences with mild respiratory distress, she has diffuse expiratory wheezes without retractions or accessory muscle usage, her pulse oximetry is low 188% but she is 94-95% on her baseline on nasal cannula oxygen since she already has home oxygen.  Abdominal: Soft. Bowel sounds are normal. She exhibits no distension and no mass. There is no tenderness. There is no rebound and no guarding.  Musculoskeletal: She exhibits no edema and no tenderness.  Baseline ROM, no obvious new focal weakness.  Neurological:  Mental status and motor strength appears baseline for patient and situation.  Skin: No rash noted.  Psychiatric: She has a normal mood and affect.    ED Course  Procedures (including critical care time) Offered admit, Pt & husband prefer discharge which also appears reasonable.Pt stable in ED with no significant deterioration in condition. Labs Reviewed - No data to display No results found.   1. CAP (community acquired pneumonia)   2. Acute bronchospasm       MDM  Patient / Family / Caregiver informed of clinical course, understand medical decision-making process, and agree with plan.  I doubt any other EMC precluding discharge at this time including, but not necessarily limited to the following:sepsis, resp failure.        Hurman Horn, MD 06/18/12 [phone]

## 2012-06-14 NOTE — ED Notes (Signed)
Pt. is HOH.  Husband reports 1 1/2 weeks of cough and increasing SHOB. Fever on 06/10/12.  Seen at PMD this morning and a HHN was administered.  She has also taken Mucinex, Tylenol and Motrin with some relief.

## 2012-06-14 NOTE — Patient Instructions (Signed)
Go to [facility] for further evaluation and management. Wear your home oxygen to keep oxygen % at 90 or above.

## 2012-06-14 NOTE — ED Notes (Addendum)
Patient states that she received a nebulizer treatment from her physicians office prior to coming to the ED. Patient is in no acute distress but she does have some shortness of breath on exertion.

## 2012-06-14 NOTE — Progress Notes (Signed)
OFFICE NOTE  06/14/2012  CC:  Chief Complaint  Patient presents with  . Influenza    flu like symptoms since Tuesday, fever 103 at home     HPI: Patient is a 67 y.o. Caucasian female who is here for "flu like sx's".  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 She had onset of feeling nauseated, HA, cough, body aches about 10d/a.  Vomited only once.  No diarrhea.  She had no fever until 4d ago when it went to 103.6 and all symptoms worsened (including some vomiting on one night).  She is using antinausea med lately.  No further fevers since 4d ago.  She feels SOB and has some wheezing noise from above chest but not in chest.  Husband says she has not appeared dusky or pale. Cough keeping her up at night.  Mucinex DM not much help.  She drank water today but ate nothing. She did take her victoza but no other meds.    Pertinent PMH:  Past Medical History  Diagnosis Date  . Thyroid disease   . Hyperlipidemia   . Hypertension     5 stents  . Arthritis   . Diabetes mellitus     type 2  . Hypoxia 10/07/2010  . Skin lesion of back 10/18/2010  . Viral infection characterized by skin and mucous membrane lesions 10/18/2010  . Vitamin D deficiency 11/21/2010  . Back pain 11/21/2010  . Motion sickness 12/04/2010  . UTI (lower urinary tract infection) 09/08/2011  . Hot flashes 10/06/2011  . Bronchitis 02/28/2012  . Abdominal  pain, other specified site 05/05/2012  . Tubular adenoma of colon 2011  . Hypothyroidism   . Depression   . Sleep apnea     uses cpap bring mask and tubing for procedure  . Myocardial infarction   . Heart murmur     MVP  . Fibromyalgia     MEDS:  Outpatient Prescriptions Prior to Visit  Medication Sig Dispense Refill  . amLODipine-benazepril (LOTREL) 5-20 MG per capsule TAKE ONE CAPSULE EVERY DAY  90 capsule  0  . aspirin 81 MG tablet Take 81 mg by mouth 2 (two)  times daily.       . Blood Glucose Monitoring Suppl (ONE TOUCH ULTRA MINI) W/DEVICE KIT Check BS qam and as needed  1 each  3  . carvedilol (COREG) 12.5 MG tablet TAKE 1/2 TABLET TWICE A DAY  180 tablet  0  . Cholecalciferol (VITAMIN D-3 PO) Take 2 tablets by mouth daily.       . clopidogrel (PLAVIX) 75 MG tablet Take 1 tablet (75 mg total) by mouth daily.  90 tablet  2  . estrogens, conjugated, (PREMARIN) 0.3 MG tablet Take 1 tablet (0.3 mg total) by mouth daily.  30 tablet  1  . ezetimibe-simvastatin (VYTORIN) 10-40 MG per tablet Take 1 tablet by mouth at bedtime.  90 tablet  1  . gabapentin (NEURONTIN) 300 MG capsule TAKE 2 CAPSULES TWICE A DAY  360 capsule  1  . glucose blood test strip Check in qam and as needed  100 each  3  . isosorbide mononitrate (IMDUR) 60 MG 24 hr tablet TAKE 1 TABLET EVERY DAY  90 tablet  1  . levothyroxine (SYNTHROID, LEVOTHROID) 25 MCG tablet Take 1 tablet (25 mcg total) by mouth daily.  90 tablet  2  . Liraglutide (VICTOZA) 18 MG/3ML  SOLN injection Inject 0.3 mLs (1.8 mg total) into the skin daily.  6 mL  3  . metformin (FORTAMET) 1000 MG (OSM) 24 hr tablet TAKE 2 TABLETS BY MOUTH EVERY DAY WITH BREAKFAST  60 tablet  2  . Milnacipran (SAVELLA) 50 MG TABS Take 50 mg by mouth 2 (two) times daily.      . ONE TOUCH LANCETS MISC Use every am and as needed  200 each  3  . pantoprazole (PROTONIX) 40 MG tablet Take 1 tablet (40 mg total) by mouth daily.  90 tablet  2  . Probiotic Product (PROBIOTIC FORMULA PO) Take by mouth.        . promethazine (PHENERGAN) 25 MG tablet Take 1 tablet (25 mg total) by mouth every 6 (six) hours as needed for nausea.  30 tablet  0  . ranitidine (ZANTAC) 300 MG tablet Take 1 tablet (300 mg total) by mouth at bedtime.  300 tablet  3  . sucralfate (CARAFATE) 1 G tablet Take 1 tablet (1 g total) by mouth as needed.  90 tablet  1  . phenazopyridine (PYRIDIUM) 200 MG tablet Take 1 tablet (200 mg total) by mouth 3 (three) times daily as needed for  pain.  10 tablet  0  . citalopram (CELEXA) 40 MG tablet        No facility-administered medications prior to visit.    PE: Blood pressure 81/53, pulse 98, temperature 97.7 F (36.5 C), temperature source Temporal, weight 249 lb (112.946 kg), SpO2 80.00%. Gen: alert, tired appearing but NAD, oriented x 4, able to attend well. Oropharynx with tacky mucosa Neck: supple LUNGS: diffuse exp wheezing, equal bilat, nonlabored, aeration is decent. CV: RRR, distant S1 and S2, rate 90s-100. ABD: soft, NT/ND EXT: no c/c/e  12 Lead EKG: NSR, no acute ischemic changes  IMPRESSION AND PLAN:  Prolonged viral syndrome, primary asthmatic bronchitic component superimposed on restrictive lung disease from her obesity.  She had subjective improvement in SOB and cough after alb/atr neb in office, wheezing became more audible/aeration improved.   Oxygen sats were 99% on 2L, 90-92 on 1L, and 88% on RA after neb treatment (this is her baseline oxygen sat at home without oxygen). Her bp is low but husband reports long history of having this periodically, plus I think she is dehydrated. EKG reassuring (also had normal stress test with Dr. Tresa Endo about 15mo ago per her report today). Given her condition today, she is improved since coming in to office today but I feel like she needs eval at ED for expedited CXR, IVFs, get steroids and possibly abx started, be at least considered for admission. Pt/husband agreed and they will proceed there now after picking up her portable oxygen at home.  They declined my offer/plan of nonemergent EMS transport to ED today. F/u at the end of the week if not admitted.

## 2012-06-14 NOTE — ED Notes (Addendum)
Pt having productive cough, fever, body aches for 10 days.  Pt on home oxygen and is concerned about O2 levels.  Currently 95%.  Pt states she has been more sob for a few days.  Seen at PCP this am and was sent for evaluation.

## 2012-06-14 NOTE — ED Notes (Signed)
MD aware of pt's O2 sats on RA. Instructed for pt to use home O2 24 hours/per day to help. On 2L pt's O2 increased to 95%.

## 2012-06-14 NOTE — ED Notes (Signed)
RT Note: Patient is currently receiving a continuous nebulizer so patient could not have her MDI administered. Patient was instructed on proper MDI use with spacer and patient feels comfortable with the technique. Husband is at bedside as well and neither have any questions. They both feel comfortable with its use. Rt will continue to monitor.

## 2012-06-15 ENCOUNTER — Telehealth: Payer: Self-pay

## 2012-06-15 MED ORDER — HYDROCOD POLST-CHLORPHEN POLST 10-8 MG/5ML PO LQCR: 5.0000 mL | Freq: Every evening | ORAL | Status: DC | PRN

## 2012-06-15 NOTE — Telephone Encounter (Signed)
pts spouse called stating pt has pneumonia and no cough medication was called in. Pts spouse would like something called in with a refill so both of them can use the medication for there dry cough. Please advise?

## 2012-06-15 NOTE — Telephone Encounter (Signed)
She can have Tussionex 1 tsp po qhs prn cough, disp 140 ml 1 rf

## 2012-06-22 ENCOUNTER — Telehealth: Payer: Self-pay | Admitting: Internal Medicine

## 2012-06-22 ENCOUNTER — Ambulatory Visit (INDEPENDENT_AMBULATORY_CARE_PROVIDER_SITE_OTHER): Payer: Medicare Other | Admitting: Family Medicine

## 2012-06-22 ENCOUNTER — Encounter: Payer: Self-pay | Admitting: Family Medicine

## 2012-06-22 VITALS — BP 126/60 | HR 92 | Temp 97.6°F | Ht 61.0 in | Wt 246.1 lb

## 2012-06-22 DIAGNOSIS — H6690 Otitis media, unspecified, unspecified ear: Secondary | ICD-10-CM

## 2012-06-22 DIAGNOSIS — I10 Essential (primary) hypertension: Secondary | ICD-10-CM

## 2012-06-22 DIAGNOSIS — E119 Type 2 diabetes mellitus without complications: Secondary | ICD-10-CM

## 2012-06-22 NOTE — Assessment & Plan Note (Signed)
Sugars improved, minimize carbs.

## 2012-06-22 NOTE — Telephone Encounter (Signed)
Patient's husband calling to cancel her procedure for next week(06/29/12). Patient has pneumonia. He states they will call back to reschedule the procedure after she is better. Noreene Larsson notified at [facility] endo

## 2012-06-22 NOTE — Telephone Encounter (Signed)
OK, please take her off the schedule

## 2012-06-22 NOTE — Assessment & Plan Note (Signed)
Keflex 500 mg po tid and probiotics, Mucinex. Increase rest and hydration

## 2012-06-22 NOTE — Patient Instructions (Addendum)

## 2012-06-22 NOTE — Assessment & Plan Note (Signed)
Well controlled no changes today 

## 2012-06-22 NOTE — Progress Notes (Signed)
Patient ID: Amber [name], female   DOB: Jul 11, 1945, 67 y.o.   MRN: [id number] Amber [name] [id number] [birth year] 06/22/2012      Progress Note-Follow Up  Subjective  Chief Complaint  Chief Complaint  Patient presents with  . Follow-up    HPI  Patient is a 67 year old Caucasian female in today for followup. She continues to struggle with congestion and cough. Has had increased headache and some hoarseness. No fevers or chills. No GI or GU complaints. No chest pain or palpitations no shortness of breath or wheezing. No urinary complaints blisters continued to be adequately controlled  Past Medical History  Diagnosis Date  . Thyroid disease   . Hyperlipidemia   . Hypertension     5 stents  . Arthritis   . Diabetes mellitus     type 2  . Hypoxia 10/07/2010  . Skin lesion of back 10/18/2010  . Viral infection characterized by skin and mucous membrane lesions 10/18/2010  . Vitamin D deficiency 11/21/2010  . Back pain 11/21/2010  . Motion sickness 12/04/2010  . UTI (lower urinary tract infection) 09/08/2011  . Hot flashes 10/06/2011  . Bronchitis 02/28/2012  . Abdominal  pain, other specified site 05/05/2012  . Tubular adenoma of colon 2011  . Hypothyroidism   . Depression   . Sleep apnea     uses cpap bring mask and tubing for procedure  . Myocardial infarction   . Heart murmur     MVP  . Fibromyalgia     Past Surgical History  Procedure Laterality Date  . Angioplasty  1980  . Angioplasty  2006    3 stents  . Angioplasty  2009    2 stents  . Cholecystectomy    . Thyroidectomy  1950    for goiter  . Diverticulitis  1978    exploratory  . Right rotator cuff repair  2004  . Left rotator cuff repair  2004  . Bone fusion left foot  2010    7 screws in foot, bunionectomy  . Hip surgery      large bone spur removed, right  . Back surgery  2004    lumbar discectomy for bulging disc  . Abdominal hysterectomy  1977    partial    Family History  Problem Relation  Age of Onset  . Heart disease Father   . Diabetes Sister   . Hypertension Sister   . Depression Sister   . Cancer Sister     colon cancer s/p colectomy  . Emphysema Brother     smoker  . Diabetes Daughter   . Hypertension Daughter   . Depression Daughter   . Diverticulitis Daughter   . Other Daughter     Trichotrilomania  . Aneurysm Maternal Grandmother     brain aneurysm  . Diabetes Paternal Grandmother   . Hypertension Paternal Grandmother   . Cancer Paternal Grandmother     colon  . Crohn's disease Paternal Grandfather   . Emphysema Brother     smoker  . Heart disease Brother   . Diabetes Brother   . Cancer Brother     thyroid  . Coronary artery disease Brother     s/p MI's and 7 stents  . Diabetes Brother   . Other Brother     back disease w/ bulging discs  . Colon polyps Brother     esophageal and tongue polyps  . Diabetes Sister   . Other Sister     short term memory  loss  . Aneurysm Sister     brain aneursm s/p coiling  . Diabetes Son   . Anxiety disorder Son     History   Social History  . Marital Status: Married    Spouse Name: N/A    Number of Children: N/A  . Years of Education: N/A   Occupational History  . retired Geologist, engineering    Social History Main Topics  . Smoking status: Never Smoker   . Smokeless tobacco: Never Used  . Alcohol Use: No  . Drug Use: No  . Sexually Active: Yes -- Female partner(s)   Other Topics Concern  . Not on file   Social History Narrative  . No narrative on file    Current Outpatient Prescriptions on File Prior to Visit  Medication Sig Dispense Refill  . albuterol (PROVENTIL HFA;VENTOLIN HFA) 108 (90 BASE) MCG/ACT inhaler Inhale 2 puffs into the lungs every 2 (two) hours as needed for wheezing or shortness of breath (cough).  1 Inhaler  0  . amLODipine-benazepril (LOTREL) 5-20 MG per capsule TAKE ONE CAPSULE EVERY DAY  90 capsule  0  . aspirin 81 MG tablet Take 81 mg by mouth 2 (two) times daily.        . Blood Glucose Monitoring Suppl (ONE TOUCH ULTRA MINI) W/DEVICE KIT Check BS qam and as needed  1 each  3  . carvedilol (COREG) 12.5 MG tablet TAKE 1/2 TABLET TWICE A DAY  180 tablet  0  . chlorpheniramine-HYDROcodone (TUSSIONEX PENNKINETIC ER) 10-8 MG/5ML LQCR Take 5 mLs by mouth at bedtime as needed.  140 mL  1  . Cholecalciferol (VITAMIN D-3 PO) Take 2 tablets by mouth daily.       . clopidogrel (PLAVIX) 75 MG tablet Take 1 tablet (75 mg total) by mouth daily.  90 tablet  2  . escitalopram (LEXAPRO) 20 MG tablet Take 2 tablets by mouth daily.      Marland Kitchen estrogens, conjugated, (PREMARIN) 0.3 MG tablet Take 1 tablet (0.3 mg total) by mouth daily.  30 tablet  1  . ezetimibe-simvastatin (VYTORIN) 10-40 MG per tablet Take 1 tablet by mouth at bedtime.  90 tablet  1  . gabapentin (NEURONTIN) 300 MG capsule TAKE 2 CAPSULES TWICE A DAY  360 capsule  1  . glucose blood test strip Check in qam and as needed  100 each  3  . isosorbide mononitrate (IMDUR) 60 MG 24 hr tablet TAKE 1 TABLET EVERY DAY  90 tablet  1  . Krill Oil 300 MG CAPS Take 1 capsule by mouth daily.      Marland Kitchen levothyroxine (SYNTHROID, LEVOTHROID) 25 MCG tablet Take 1 tablet (25 mcg total) by mouth daily.  90 tablet  2  . Liraglutide (VICTOZA) 18 MG/3ML SOLN injection Inject 0.3 mLs (1.8 mg total) into the skin daily.  6 mL  3  . metformin (FORTAMET) 1000 MG (OSM) 24 hr tablet TAKE 2 TABLETS BY MOUTH EVERY DAY WITH BREAKFAST  60 tablet  2  . Milnacipran (SAVELLA) 50 MG TABS Take 50 mg by mouth 2 (two) times daily.      . ONE TOUCH LANCETS MISC Use every am and as needed  200 each  3  . pantoprazole (PROTONIX) 40 MG tablet Take 1 tablet (40 mg total) by mouth daily.  90 tablet  2  . phenazopyridine (PYRIDIUM) 200 MG tablet Take 1 tablet (200 mg total) by mouth 3 (three) times daily as needed for pain.  10 tablet  0  . Probiotic Product (PROBIOTIC FORMULA PO) Take by mouth.        . promethazine (PHENERGAN) 25 MG tablet Take 1 tablet (25 mg  total) by mouth every 6 (six) hours as needed for nausea.  30 tablet  0  . ranitidine (ZANTAC) 300 MG tablet Take 1 tablet (300 mg total) by mouth at bedtime.  300 tablet  3  . Spacer/Aero-Holding Chambers (AEROCHAMBER PLUS WITH MASK) inhaler Use as instructed  1 each  2  . sucralfate (CARAFATE) 1 G tablet Take 1 tablet (1 g total) by mouth as needed.  90 tablet  1   No current facility-administered medications on file prior to visit.    Allergies  Allergen Reactions  . Percocet (Oxycodone-Acetaminophen) Itching    Review of Systems  Review of Systems  Constitutional: Positive for malaise/fatigue. Negative for fever.  HENT: Positive for congestion.   Eyes: Negative for discharge.  Respiratory: Positive for cough. Negative for shortness of breath.   Cardiovascular: Negative for chest pain, palpitations and leg swelling.  Gastrointestinal: Negative for nausea, abdominal pain and diarrhea.  Genitourinary: Negative for dysuria.  Musculoskeletal: Negative for falls.  Skin: Negative for rash.  Neurological: Positive for headaches. Negative for loss of consciousness.  Endo/Heme/Allergies: Negative for polydipsia.  Psychiatric/Behavioral: Negative for depression and suicidal ideas. The patient is not nervous/anxious and does not have insomnia.     Objective  BP 126/60  Pulse 92  Temp(Src) 97.6 F (36.4 C) (Oral)  Ht 5\' 1"  (1.549 m)  Wt 246 lb 1.9 oz (111.639 kg)  BMI 46.53 kg/m2  SpO2 94%  Physical Exam  Physical Exam  Constitutional: She is oriented to person, place, and time and well-developed, well-nourished, and in no distress. No distress.  HENT:  Head: Normocephalic and atraumatic.  Left TM mildly erythematous and dull  Eyes: Conjunctivae are normal.  Neck: Neck supple. No thyromegaly present.  Cardiovascular: Normal rate, regular rhythm and normal heart sounds.   No murmur heard. Pulmonary/Chest: Effort normal. She has no wheezes.  Abdominal: She exhibits no  distension and no mass.  Musculoskeletal: She exhibits no edema.  Lymphadenopathy:    She has no cervical adenopathy.  Neurological: She is alert and oriented to person, place, and time.  Skin: Skin is warm and dry. No rash noted. She is not diaphoretic.  Psychiatric: Memory, affect and judgment normal.    Lab Results  Component Value Date   TSH 3.36 04/06/2012   Lab Results  Component Value Date   WBC 10.2 04/06/2012   HGB 11.8* 04/06/2012   HCT 36.0 04/06/2012   MCV 92.9 04/06/2012   PLT 232.0 04/06/2012   Lab Results  Component Value Date   CREATININE 1.0 04/06/2012   BUN 25* 04/06/2012   NA 140 04/06/2012   K 4.1 04/06/2012   CL 101 04/06/2012   CO2 31 04/06/2012   Lab Results  Component Value Date   ALT 21 04/06/2012   AST 19 04/06/2012   ALKPHOS 82 04/06/2012   BILITOT 0.5 04/06/2012   Lab Results  Component Value Date   CHOL 197 04/06/2012   Lab Results  Component Value Date   HDL 45.80 04/06/2012   Lab Results  Component Value Date   LDLCALC 114* 04/06/2012   Lab Results  Component Value Date   TRIG 187.0* 04/06/2012   Lab Results  Component Value Date   CHOLHDL 4 04/06/2012     Assessment & Plan  Otitis media Keflex 500 mg po  tid and probiotics, Mucinex. Increase rest and hydration  ESSENTIAL HYPERTENSION, BENIGN Well controlled no changes today  DM Sugars improved, minimize carbs.

## 2012-06-24 ENCOUNTER — Other Ambulatory Visit: Payer: Self-pay | Admitting: Family Medicine

## 2012-06-25 NOTE — Telephone Encounter (Signed)
Last seen 06/22/12 Mammogram on 07/07/12 Refill sent per [facility] refill protocol.

## 2012-06-28 ENCOUNTER — Ambulatory Visit: Payer: Medicare Other

## 2012-06-29 ENCOUNTER — Encounter (HOSPITAL_COMMUNITY): Payer: Self-pay

## 2012-06-29 ENCOUNTER — Telehealth: Payer: Self-pay | Admitting: Family Medicine

## 2012-06-29 ENCOUNTER — Ambulatory Visit (HOSPITAL_COMMUNITY): Admit: 2012-06-29 | Payer: Medicare Other | Admitting: Internal Medicine

## 2012-06-29 ENCOUNTER — Ambulatory Visit: Payer: Medicare Other

## 2012-06-29 HISTORY — DX: Sleep apnea, unspecified: G47.30

## 2012-06-29 HISTORY — DX: Cardiac murmur, unspecified: R01.1

## 2012-06-29 HISTORY — DX: Fibromyalgia: M79.7

## 2012-06-29 HISTORY — DX: Acute myocardial infarction, unspecified: I21.9

## 2012-06-29 HISTORY — DX: Hypothyroidism, unspecified: E03.9

## 2012-06-29 SURGERY — COLONOSCOPY WITH PROPOFOL
Anesthesia: Monitor Anesthesia Care

## 2012-06-29 NOTE — Telephone Encounter (Signed)
Please advise this refill request. 

## 2012-06-29 NOTE — Telephone Encounter (Signed)
OK to refill Keflex once if no improvement needs to come in

## 2012-06-30 MED ORDER — CEPHALEXIN 500 MG PO CAPS: 500.0000 mg | ORAL_CAPSULE | Freq: Two times a day (BID) | ORAL | Status: DC

## 2012-07-06 ENCOUNTER — Ambulatory Visit: Payer: Medicare Other

## 2012-07-06 ENCOUNTER — Ambulatory Visit
Admission: RE | Admit: 2012-07-06 | Discharge: 2012-07-06 | Disposition: A | Discharge status: Home / Self Care | Payer: Medicare Other | Source: Ambulatory Visit | Attending: Obstetrics and Gynecology | Admitting: Obstetrics and Gynecology

## 2012-07-06 ENCOUNTER — Other Ambulatory Visit: Payer: Self-pay | Admitting: Obstetrics & Gynecology

## 2012-07-06 ENCOUNTER — Other Ambulatory Visit: Payer: Self-pay | Admitting: Family Medicine

## 2012-07-06 DIAGNOSIS — Z1231 Encounter for screening mammogram for malignant neoplasm of breast: Secondary | ICD-10-CM

## 2012-07-08 ENCOUNTER — Other Ambulatory Visit: Payer: Self-pay | Admitting: Family Medicine

## 2012-07-10 NOTE — Telephone Encounter (Signed)
Prescribed 04/30/12  Is it okay to refill?

## 2012-07-15 ENCOUNTER — Telehealth: Payer: Self-pay | Admitting: Internal Medicine

## 2012-07-15 NOTE — Telephone Encounter (Signed)
OK to schedule first week in April, Thursday April 3, WITH PROPOFOL IS OK

## 2012-07-15 NOTE — Telephone Encounter (Signed)
Patient is over her pneumonia and is asking to reschedule the prop colonoscopy at [facility] hospital the first week of April if possible. Please, advise.

## 2012-07-16 ENCOUNTER — Encounter: Payer: Self-pay | Admitting: *Deleted

## 2012-07-16 ENCOUNTER — Other Ambulatory Visit: Payer: Self-pay | Admitting: *Deleted

## 2012-07-16 ENCOUNTER — Telehealth: Payer: Self-pay | Admitting: *Deleted

## 2012-07-16 DIAGNOSIS — Z86010 Personal history of colonic polyps: Secondary | ICD-10-CM

## 2012-07-16 NOTE — Telephone Encounter (Signed)
Per Noreene Larsson need to r/s colonoscopy with prop to 07/26/12 at 10:00 AM . Left a message for patient's husband to call me.

## 2012-07-16 NOTE — Telephone Encounter (Signed)
Scheduled patient on 08/02/12 at 11:00 AM at [facility] endo(Jill) Booking number [phone]. Will mail patient new date and instructions. Per Dr. Tresa Endo patient may hold Plavix 5 days prior.(06/09/12 letter) Patient's husband aware.

## 2012-07-19 ENCOUNTER — Encounter (HOSPITAL_COMMUNITY): Payer: Self-pay | Admitting: *Deleted

## 2012-07-19 ENCOUNTER — Encounter (HOSPITAL_COMMUNITY): Payer: Self-pay | Admitting: Pharmacy Technician

## 2012-07-19 NOTE — Telephone Encounter (Signed)
Spoke with patient's husband and gave him new date for procedure. He will have patient stop Plavix on 07/21/12. New instructions mailed to patient.

## 2012-07-25 NOTE — H&P (Signed)
HPI Amber [name] is a very nice 67 year old white female who comes in today to discuss followup colonoscopy. She is a primary patient of Dr. Darrow Bussing and once to have all of her medical care consolidated . She has multiple significant medical problems including morbid obesity, sleep apnea with CPAP use and nighttime oxygen use. Insulin-dependent diabetes mellitus coronary artery disease status post stents x3 last done 2006. She is maintained on Plavix and aspirin and followed by Dr. Tresa Endo from a cardiac standpoint. She also has history of hypertension and peripheral neuropathy.  She last had colonoscopy by Dr. Dorena Cookey in January of 2011 and had 3 sessile polyps found in the transverse colon all 4-6 mm in size and one 8 mm polyp in the rectum also noted diverticulosis in the sigmoid colon and scattered throughout the remainder of the colon. Path showed one tubular adenoma 2 hyperplastic polyps in the rectal polyp was also hyperplastic. She was recommended to have 3 year followup.  Her family history is positive for colon cancer in her sister .  She currently has no complaints. Specifically, no complaints of abdominal pain, no changes in her bowel habits no melena or hematochezia. Appetite has been fine weight has been stable no dysphagia heartburn or indigestion.  Review of Systems  Constitutional: Negative.  HENT: Negative.  Eyes: Negative.  Respiratory: Negative.  Cardiovascular: Negative.  Gastrointestinal: Negative.  Genitourinary: Negative.  Musculoskeletal: Positive for back pain.  Neurological: Negative.  Psychiatric/Behavioral: Negative.  Outpatient Prescriptions Prior to Visit   Medication  Sig  Dispense  Refill   .  amLODipine-benazepril (LOTREL) 5-20 MG per capsule  Take 1 capsule by mouth daily.  90 capsule  1   .  aspirin 81 MG tablet  Take 81 mg by mouth 2 (two) times daily.     .  Blood Glucose Monitoring Suppl (ONE TOUCH ULTRA MINI) W/DEVICE KIT  Check BS qam and as needed  1  each  3   .  carvedilol (COREG) 12.5 MG tablet  Take 0.5 tablets (6.25 mg total) by mouth 2 (two) times daily.  180 tablet  1   .  Cholecalciferol (VITAMIN D-3 PO)  Take 2 tablets by mouth daily.     .  citalopram (CELEXA) 40 MG tablet      .  clopidogrel (PLAVIX) 75 MG tablet  Take 1 tablet (75 mg total) by mouth daily.  90 tablet  2   .  estrogens, conjugated, (PREMARIN) 0.3 MG tablet  Take 1 tablet (0.3 mg total) by mouth daily.  30 tablet  1   .  ezetimibe-simvastatin (VYTORIN) 10-40 MG per tablet  Take 1 tablet by mouth at bedtime.  90 tablet  1   .  gabapentin (NEURONTIN) 300 MG capsule  Take 2 capsules (600 mg total) by mouth 2 (two) times daily.  360 capsule  1   .  glucose blood test strip  Check in qam and as needed  100 each  3   .  isosorbide mononitrate (IMDUR) 60 MG 24 hr tablet  TAKE 1 TABLET EVERY DAY  90 tablet  1   .  levothyroxine (SYNTHROID, LEVOTHROID) 25 MCG tablet  Take 1 tablet (25 mcg total) by mouth daily.  90 tablet  2   .  Liraglutide (VICTOZA) 18 MG/3ML SOLN injection  Inject 0.3 mLs (1.8 mg total) into the skin daily.  6 mL  3   .  metformin (FORTAMET) 1000 MG (OSM) 24 hr tablet  Take 2  tablets (2,000 mg total) by mouth daily with breakfast.  60 tablet  2   .  Milnacipran (SAVELLA) 50 MG TABS  Take 50 mg by mouth 2 (two) times daily.     .  ONE TOUCH LANCETS MISC  Use every am and as needed  200 each  3   .  pantoprazole (PROTONIX) 40 MG tablet  Take 1 tablet (40 mg total) by mouth daily.  90 tablet  2   .  phenazopyridine (PYRIDIUM) 200 MG tablet  Take 1 tablet (200 mg total) by mouth 3 (three) times daily as needed for pain.  10 tablet  0   .  Probiotic Product (PROBIOTIC FORMULA PO)  Take by mouth.     .  promethazine (PHENERGAN) 25 MG tablet  Take 1 tablet (25 mg total) by mouth every 6 (six) hours as needed for nausea.  30 tablet  0   .  ranitidine (ZANTAC) 300 MG tablet  Take 1 tablet (300 mg total) by mouth at bedtime.  300 tablet  3   .  sucralfate (CARAFATE)  1 G tablet  Take 1 tablet (1 g total) by mouth as needed.  90 tablet  1   .  [DISCONTINUED] ciprofloxacin (CIPRO) 500 MG tablet  Take 1 tablet (500 mg total) by mouth 2 (two) times daily.  14 tablet  0   .  [DISCONTINUED] oxyCODONE-acetaminophen (PERCOCET) 10-325 MG per tablet  Take 1 tablet by mouth every 8 (eight) hours as needed for pain.  45 tablet  0   Last reviewed on 06/09/2012 2:28 PM by Sammuel Cooper, PA  Allergies   Allergen  Reactions   .  Percocet (Oxycodone-Acetaminophen)  Itching    Patient Active Problem List   Diagnosis   .  COLONIC POLYPS   .  Unspecified hypothyroidism   .  DM   .  HYPERLIPIDEMIA   .  MORBID OBESITY   .  ANEMIA   .  ANXIETY DEPRESSION   .  PERIPHERAL NEUROPATHY   .  ESSENTIAL HYPERTENSION, BENIGN   .  CAD   .  MITRAL VALVE PROLAPSE   .  DIVERTICULAR DISEASE   .  UNSPECIFIED MYALGIA AND MYOSITIS   .  SLEEP APNEA   .  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  Hypoxia   .  Left eye pain   .  Skin lesion of back   .  Viral infection characterized by skin and mucous membrane lesions   .  Dyspnea   .  Vitamin D deficiency   .  Back pain   .  Motion sickness   .  UTI (lower urinary tract infection)   .  Hot flashes   .  Bronchitis   .  Abdominal pain, other specified site    History   Substance Use Topics   .  Smoking status:  Never Smoker   .  Smokeless tobacco:  Never Used   .  Alcohol Use:  No   family history includes Aneurysm in her maternal grandmother and sister; Anxiety disorder in her son; Cancer in her brother, paternal grandmother, and sister; Colon polyps in her brother; Coronary artery disease in her brother; Crohn's disease in her paternal grandfather; Depression in her daughter and sister; Diabetes in her brothers, daughter, paternal grandmother, sisters, and son; Diverticulitis in her daughter; Emphysema in her brothers; Heart disease in her brother and father; Hypertension in her daughter, paternal grandmother, and  sister;  and Other in her brother, daughter, and sister.  Objective:   Physical Exam no well-developed morbidly obese white female in no acute distress, pleasant company by her husband. Blood pressure 132/64 pulse 90 height 5 foot 1 weight 253 (BMI 48). HEENT; nontraumatic, normocephalic, EOMI PERRLA sclera anicteric,Neck; Supple no JVD, Cardiovascular; regular rate and rhythm with S1-S2 no significant murmur rub or gallop, Pulmonary; clear bilaterally, Abdomen; morbidly obese, soft ,nontender ,no palpable mass or hepatosplenomegaly bowel sounds are active  Incisional scar, Rectal; exam not done, Extremities; trace edema bilateral feet and ankles, Psych;mood and affect normal and appropriate  Assessment & Plan:   #[age] -year-old female with family history of colon cancer in a first degree relative and personal history of adenomatous colon polyps, last colonoscopy 3 years ago and was recommended for a three-year followup.  #2 chronic antiplatelet therapy with Plavix and aspirin  #3 coronary artery disease status post stents x3 2006  #4 insulin-dependent diabetes mellitus  #5 morbid obesity  #6 sleep apnea and probable obesity hypoventilation syndrome with nighttime 02 use.  #7 diverticulosis  Plan; schedule for colonoscopy with Dr. Lina Sar. This will be scheduled at the hospital with propofol. Procedure was discussed in detail with the patient and her husband and they are agreeable to proceed.  She will need to come off

## 2012-07-26 ENCOUNTER — Encounter (HOSPITAL_COMMUNITY): Payer: Self-pay | Admitting: Anesthesiology

## 2012-07-26 ENCOUNTER — Ambulatory Visit (HOSPITAL_COMMUNITY)
Admission: RE | Admit: 2012-07-26 | Discharge: 2012-07-26 | Disposition: A | Discharge status: Home / Self Care | Payer: Medicare Other | Source: Ambulatory Visit | Attending: Internal Medicine | Admitting: Internal Medicine

## 2012-07-26 ENCOUNTER — Encounter (HOSPITAL_COMMUNITY)
Admission: RE | Disposition: A | Discharge status: Home / Self Care | Payer: Self-pay | Source: Ambulatory Visit | Attending: Internal Medicine

## 2012-07-26 ENCOUNTER — Ambulatory Visit (HOSPITAL_COMMUNITY): Payer: Medicare Other | Admitting: Anesthesiology

## 2012-07-26 DIAGNOSIS — E785 Hyperlipidemia, unspecified: Secondary | ICD-10-CM | POA: Insufficient documentation

## 2012-07-26 DIAGNOSIS — Z6841 Body Mass Index (BMI) 40.0 and over, adult: Secondary | ICD-10-CM | POA: Insufficient documentation

## 2012-07-26 DIAGNOSIS — Z9861 Coronary angioplasty status: Secondary | ICD-10-CM | POA: Insufficient documentation

## 2012-07-26 DIAGNOSIS — I059 Rheumatic mitral valve disease, unspecified: Secondary | ICD-10-CM | POA: Insufficient documentation

## 2012-07-26 DIAGNOSIS — Z860100 Personal history of colon polyps, unspecified: Secondary | ICD-10-CM

## 2012-07-26 DIAGNOSIS — K5730 Diverticulosis of large intestine without perforation or abscess without bleeding: Secondary | ICD-10-CM | POA: Insufficient documentation

## 2012-07-26 DIAGNOSIS — Z794 Long term (current) use of insulin: Secondary | ICD-10-CM | POA: Insufficient documentation

## 2012-07-26 DIAGNOSIS — J40 Bronchitis, not specified as acute or chronic: Secondary | ICD-10-CM | POA: Insufficient documentation

## 2012-07-26 DIAGNOSIS — Z800 Family history of malignant neoplasm of digestive organs: Secondary | ICD-10-CM

## 2012-07-26 DIAGNOSIS — Z79899 Other long term (current) drug therapy: Secondary | ICD-10-CM | POA: Insufficient documentation

## 2012-07-26 DIAGNOSIS — I2510 Atherosclerotic heart disease of native coronary artery without angina pectoris: Secondary | ICD-10-CM | POA: Insufficient documentation

## 2012-07-26 DIAGNOSIS — D126 Benign neoplasm of colon, unspecified: Secondary | ICD-10-CM

## 2012-07-26 DIAGNOSIS — Z7982 Long term (current) use of aspirin: Secondary | ICD-10-CM | POA: Insufficient documentation

## 2012-07-26 DIAGNOSIS — F329 Major depressive disorder, single episode, unspecified: Secondary | ICD-10-CM | POA: Insufficient documentation

## 2012-07-26 DIAGNOSIS — D649 Anemia, unspecified: Secondary | ICD-10-CM | POA: Insufficient documentation

## 2012-07-26 DIAGNOSIS — F3289 Other specified depressive episodes: Secondary | ICD-10-CM | POA: Insufficient documentation

## 2012-07-26 DIAGNOSIS — I10 Essential (primary) hypertension: Secondary | ICD-10-CM | POA: Insufficient documentation

## 2012-07-26 DIAGNOSIS — Z7902 Long term (current) use of antithrombotics/antiplatelets: Secondary | ICD-10-CM | POA: Insufficient documentation

## 2012-07-26 DIAGNOSIS — E039 Hypothyroidism, unspecified: Secondary | ICD-10-CM | POA: Insufficient documentation

## 2012-07-26 DIAGNOSIS — G609 Hereditary and idiopathic neuropathy, unspecified: Secondary | ICD-10-CM | POA: Insufficient documentation

## 2012-07-26 DIAGNOSIS — Z1211 Encounter for screening for malignant neoplasm of colon: Secondary | ICD-10-CM | POA: Insufficient documentation

## 2012-07-26 DIAGNOSIS — Z7901 Long term (current) use of anticoagulants: Secondary | ICD-10-CM

## 2012-07-26 DIAGNOSIS — F411 Generalized anxiety disorder: Secondary | ICD-10-CM | POA: Insufficient documentation

## 2012-07-26 DIAGNOSIS — E119 Type 2 diabetes mellitus without complications: Secondary | ICD-10-CM | POA: Insufficient documentation

## 2012-07-26 DIAGNOSIS — I252 Old myocardial infarction: Secondary | ICD-10-CM | POA: Insufficient documentation

## 2012-07-26 DIAGNOSIS — G4730 Sleep apnea, unspecified: Secondary | ICD-10-CM | POA: Insufficient documentation

## 2012-07-26 HISTORY — DX: Gastro-esophageal reflux disease without esophagitis: K21.9

## 2012-07-26 HISTORY — PX: COLONOSCOPY: SHX5424

## 2012-07-26 HISTORY — DX: Atherosclerotic heart disease of native coronary artery without angina pectoris: I25.10

## 2012-07-26 SURGERY — COLONOSCOPY
Anesthesia: Monitor Anesthesia Care

## 2012-07-26 MED ORDER — SODIUM CHLORIDE 0.9 % IV SOLN: INTRAVENOUS | Status: DC

## 2012-07-26 MED ORDER — FENTANYL CITRATE 0.05 MG/ML IJ SOLN
INTRAMUSCULAR | Status: DC | PRN
  Administered 2012-07-26: 100 ug via INTRAVENOUS

## 2012-07-26 MED ORDER — ONDANSETRON HCL 4 MG/2ML IJ SOLN
4.0000 mg | INTRAMUSCULAR | Status: DC | PRN
Start: 2012-07-26 — End: 2012-07-26
  Administered 2012-07-26: 4 mg via INTRAVENOUS

## 2012-07-26 MED ORDER — PROPOFOL 10 MG/ML IV EMUL
INTRAVENOUS | Status: DC | PRN
  Administered 2012-07-26: 200 ug/kg/min via INTRAVENOUS

## 2012-07-26 MED ORDER — ONDANSETRON HCL 4 MG/2ML IJ SOLN
INTRAMUSCULAR | Status: AC
  Filled 2012-07-26: qty 2

## 2012-07-26 MED ORDER — LACTATED RINGERS IV SOLN
INTRAVENOUS | Status: DC
  Administered 2012-07-26: 1000 mL via INTRAVENOUS

## 2012-07-26 MED ORDER — MIDAZOLAM HCL 5 MG/5ML IJ SOLN
INTRAMUSCULAR | Status: DC | PRN
  Administered 2012-07-26 (×2): 2 mg via INTRAVENOUS

## 2012-07-26 NOTE — Anesthesia Preprocedure Evaluation (Signed)
Anesthesia Evaluation  Patient identified by MRN, date of birth, ID band Patient awake    Reviewed: Allergy & Precautions, H&P , NPO status , Patient's Chart, lab work & pertinent test results, reviewed documented beta blocker date and time   Airway Mallampati: III TM Distance: >3 FB Neck ROM: full    Dental   Pulmonary shortness of breath and with exertion,  breath sounds clear to auscultation        Cardiovascular hypertension, On Medications and On Home Beta Blockers + CAD, + Past MI and + Cardiac Stents + Valvular Problems/Murmurs Rhythm:regular     Neuro/Psych PSYCHIATRIC DISORDERS  Neuromuscular disease    GI/Hepatic Neg liver ROS, GERD-  Medicated and Controlled,  Endo/Other  diabetes, Oral Hypoglycemic AgentsHypothyroidism Morbid obesity  Renal/GU negative Renal ROS  negative genitourinary   Musculoskeletal   Abdominal   Peds  Hematology  (+) anemia ,   Anesthesia Other Findings See surgeon's H&P   Reproductive/Obstetrics negative OB ROS                           Anesthesia Physical Anesthesia Plan  ASA: III  Anesthesia Plan: MAC   Post-op Pain Management:    Induction:   Airway Management Planned: Simple Face Mask  Additional Equipment:   Intra-op Plan:   Post-operative Plan:   Informed Consent: I have reviewed the patients History and Physical, chart, labs and discussed the procedure including the risks, benefits and alternatives for the proposed anesthesia with the patient or authorized representative who has indicated his/her understanding and acceptance.   Dental Advisory Given  Plan Discussed with: CRNA and Surgeon  Anesthesia Plan Comments:         Anesthesia Quick Evaluation

## 2012-07-26 NOTE — Anesthesia Postprocedure Evaluation (Signed)
Anesthesia Post Note  Patient: Amber [name]  Procedure(s) Performed: Procedure(s) (LRB): COLONOSCOPY (N/A)  Anesthesia type: General  Patient location: PACU  Post pain: Pain level controlled  Post assessment: Patient's Cardiovascular Status Stable  Last Vitals:  Filed Vitals:   07/26/12 1150  BP: 113/62  Pulse:   Temp:   Resp: 15    Post vital signs: Reviewed and stable  Level of consciousness: alert  Complications: No apparent anesthesia complications

## 2012-07-26 NOTE — Transfer of Care (Signed)
Immediate Anesthesia Transfer of Care Note  Patient: Amber [name]  Procedure(s) Performed: Procedure(s): COLONOSCOPY (N/A)  Patient Location: PACU  Anesthesia Type:MAC  Level of Consciousness: awake, alert  and oriented  Airway & Oxygen Therapy: Patient Spontanous Breathing and Patient connected to face mask oxygen  Post-op Assessment: Report given to PACU RN and Post -op Vital signs reviewed and stable  Post vital signs: Reviewed and stable  Complications: No apparent anesthesia complications

## 2012-07-26 NOTE — Interval H&P Note (Signed)
History and Physical Interval Note:  07/26/2012 9:59 AM  Amber [name]  has presented today for surgery, with the diagnosis of recall colon hx adenomatous polyps  The various methods of treatment have been discussed with the patient and family. After consideration of risks, benefits and other options for treatment, the patient has consented to  Procedure(s): COLONOSCOPY (N/A) as a surgical intervention .  The patient's history has been reviewed, patient examined, no change in status, stable for surgery.  I have reviewed the patient's chart and labs.  Questions were answered to the patient's satisfaction.     Lina Sar

## 2012-07-26 NOTE — Op Note (Signed)
[facility] [address] [place], [address]   COLONOSCOPY PROCEDURE REPORT  PATIENT: Amber, [name]  MR#: [id number] BIRTHDATE: [birth year] , 66  yrs. old GENDER: Female ENDOSCOPIST: Hart Carwin, MD REFERRED BY:  Reuel Derby, M.D. PROCEDURE DATE:  07/26/2012 PROCEDURE:   Colonoscopy with cold biopsy polypectomy and Colonoscopy with snare polypectomy ASA CLASS:   Class III INDICATIONS:Patient's immediate family history of colon cancer and sister with colon cancer, last colon 2011- adenomatous polyp. MEDICATIONS: MAC sedation, administered by CRNA  DESCRIPTION OF PROCEDURE:   After the risks and benefits and of the procedure were explained, informed consent was obtained.  A digital rectal exam revealed no abnormalities of the rectum.    The endoscope was introduced through the anus and advanced to the cecum, which was identified by both the appendix and ileocecal valve .  The quality of the prep was adequate, using MoviPrep . The instrument was then slowly withdrawn as the colon was fully examined.     COLON FINDINGS: Three smooth sessile polyps were found in the ascending colon and descending colon.  A polypectomy was performed with cold forceps and with a cold snare.  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   There was moderate diverticulosis noted throughout the entire examined colon with associated tortuosity and muscular hypertrophy.     Retroflexed views revealed no abnormalities.     The scope was then withdrawn from the patient and the procedure completed.  COMPLICATIONS: There were no complications. ENDOSCOPIC IMPRESSION: 1.   Three sessile polyps were found in the ascending colon and descending colon; polypectomy was performed with cold forceps and with a cold snare 2.   There was moderate diverticulosis noted throughout the entire examined colon 3. lipomatous ileocecal  valve , no change from prior exam  RECOMMENDATIONS: 1.  Await biopsy results 2.  High fiber diet 3. hold Plavix x 10 days, then restart   REPEAT EXAM: for Colonoscopy. pending polyp results  cc:Nicki Guadalajara, MD  _______________________________ eSigned:  Hart Carwin, MD 07/26/2012 11:11 AM     PATIENT NAME:  Amber, [name] MR#: [id number]

## 2012-07-27 ENCOUNTER — Ambulatory Visit (INDEPENDENT_AMBULATORY_CARE_PROVIDER_SITE_OTHER): Payer: Medicare Other | Admitting: Family Medicine

## 2012-07-27 ENCOUNTER — Encounter: Payer: Self-pay | Admitting: Internal Medicine

## 2012-07-27 ENCOUNTER — Encounter (HOSPITAL_COMMUNITY): Payer: Self-pay | Admitting: Internal Medicine

## 2012-07-27 VITALS — BP 140/96 | HR 99 | Temp 97.6°F | Ht 61.0 in | Wt 247.1 lb

## 2012-07-27 DIAGNOSIS — E119 Type 2 diabetes mellitus without complications: Secondary | ICD-10-CM

## 2012-07-27 DIAGNOSIS — F329 Major depressive disorder, single episode, unspecified: Secondary | ICD-10-CM

## 2012-07-27 DIAGNOSIS — E785 Hyperlipidemia, unspecified: Secondary | ICD-10-CM

## 2012-07-27 DIAGNOSIS — Z800 Family history of malignant neoplasm of digestive organs: Secondary | ICD-10-CM

## 2012-07-27 DIAGNOSIS — N390 Urinary tract infection, site not specified: Secondary | ICD-10-CM

## 2012-07-27 DIAGNOSIS — R232 Flushing: Secondary | ICD-10-CM

## 2012-07-27 DIAGNOSIS — N200 Calculus of kidney: Secondary | ICD-10-CM

## 2012-07-27 DIAGNOSIS — N951 Menopausal and female climacteric states: Secondary | ICD-10-CM

## 2012-07-27 DIAGNOSIS — R300 Dysuria: Secondary | ICD-10-CM

## 2012-07-27 DIAGNOSIS — F341 Dysthymic disorder: Secondary | ICD-10-CM

## 2012-07-27 DIAGNOSIS — I10 Essential (primary) hypertension: Secondary | ICD-10-CM

## 2012-07-27 DIAGNOSIS — K219 Gastro-esophageal reflux disease without esophagitis: Secondary | ICD-10-CM

## 2012-07-27 LAB — HEPATIC FUNCTION PANEL
AST: 21 U/L (ref 0–37)
Albumin: 4.2 g/dL (ref 3.5–5.2)
Bilirubin, Direct: 0.1 mg/dL (ref 0.0–0.3)
Total Bilirubin: 0.4 mg/dL (ref 0.3–1.2)

## 2012-07-27 LAB — HEMOGLOBIN A1C: Mean Plasma Glucose: 128 mg/dL — ABNORMAL HIGH (ref <117)

## 2012-07-27 LAB — LIPID PANEL
HDL: 37 mg/dL — ABNORMAL LOW (ref >39)
LDL Cholesterol: 66 mg/dL (ref 0–99)
Total CHOL/HDL Ratio: 4.1 Ratio
VLDL: 50 mg/dL — ABNORMAL HIGH (ref 0–40)

## 2012-07-27 MED ORDER — NITROFURANTOIN MACROCRYSTAL 50 MG PO CAPS: 50.0000 mg | ORAL_CAPSULE | Freq: Every day | ORAL | Status: DC

## 2012-07-27 MED ORDER — CIPROFLOXACIN HCL 250 MG PO TABS: 250.0000 mg | ORAL_TABLET | Freq: Two times a day (BID) | ORAL | Status: DC

## 2012-07-27 MED ORDER — ESCITALOPRAM OXALATE 20 MG PO TABS: 20.0000 mg | ORAL_TABLET | Freq: Every day | ORAL | Status: DC

## 2012-07-27 MED ORDER — DEXLANSOPRAZOLE 60 MG PO CPDR: 60.0000 mg | DELAYED_RELEASE_CAPSULE | Freq: Every day | ORAL | Status: DC

## 2012-07-27 NOTE — Assessment & Plan Note (Signed)
Underwent colonoscopy yesterday, tolerated procedure well

## 2012-07-27 NOTE — Patient Instructions (Addendum)
Urinary Tract Infection Urinary tract infections (UTIs) can develop anywhere along your urinary tract. Your urinary tract is your body's drainage system for removing wastes and extra water. Your urinary tract includes two kidneys, two ureters, a bladder, and a urethra. Your kidneys are a pair of bean-shaped organs. Each kidney is about the size of your fist. They are located below your ribs, one on each side of your spine. CAUSES Infections are caused by microbes, which are microscopic organisms, including fungi, viruses, and bacteria. These organisms are so small that they can only be seen through a microscope. Bacteria are the microbes that most commonly cause UTIs. SYMPTOMS  Symptoms of UTIs may vary by age and gender of the patient and by the location of the infection. Symptoms in young women typically include a frequent and intense urge to urinate and a painful, burning feeling in the bladder or urethra during urination. Older women and men are more likely to be tired, shaky, and weak and have muscle aches and abdominal pain. A fever may mean the infection is in your kidneys. Other symptoms of a kidney infection include pain in your back or sides below the ribs, nausea, and vomiting. DIAGNOSIS To diagnose a UTI, your caregiver will ask you about your symptoms. Your caregiver also will ask to provide a urine sample. The urine sample will be tested for bacteria and white blood cells. White blood cells are made by your body to help fight infection. TREATMENT  Typically, UTIs can be treated with medication. Because most UTIs are caused by a bacterial infection, they usually can be treated with the use of antibiotics. The choice of antibiotic and length of treatment depend on your symptoms and the type of bacteria causing your infection. HOME CARE INSTRUCTIONS  If you were prescribed antibiotics, take them exactly as your caregiver instructs you. Finish the medication even if you feel better after you  have only taken some of the medication.  Drink enough water and fluids to keep your urine clear or pale yellow.  Avoid caffeine, tea, and carbonated beverages. They tend to irritate your bladder.  Empty your bladder often. Avoid holding urine for long periods of time.  Empty your bladder before and after sexual intercourse.  After a bowel movement, women should cleanse from front to back. Use each tissue only once. SEEK MEDICAL CARE IF:   You have back pain.  You develop a fever.  Your symptoms do not begin to resolve within 3 days. SEEK IMMEDIATE MEDICAL CARE IF:   You have severe back pain or lower abdominal pain.  You develop chills.  You have nausea or vomiting.  You have continued burning or discomfort with urination. MAKE SURE YOU:   Understand these instructions.  Will watch your condition.  Will get help right away if you are not doing well or get worse. Document Released: 01/29/2005 Document Revised: 10/21/2011 Document Reviewed: 05/30/2011 [employer] Patient Information 2013 [employer].  

## 2012-07-27 NOTE — Assessment & Plan Note (Signed)
Has had urinary burning, frequency and urgency since last week when she passed a stone. Urine culture ordered today, started on Ciprofloxacin 250 mg bid, encouraged vigorous hydration and cranberry, then put on Nitrofurantoin 50 mg qhs to start after Cipro complete and referred to urology for consideration of kidney stones and recurrent uti. Continue probiotics

## 2012-07-27 NOTE — Progress Notes (Signed)
Patient ID: Amber [name], female   DOB: [birth year], 68 y.o.   MRN: [id number] BERNETTE SEEMAN [id number] [birth year] 07/27/2012      Progress Note-Follow Up  Subjective  Chief Complaint  Chief Complaint  Patient presents with  . Urinary Tract Infection    burning with urination, difficulty urinating    HPI  Patient is a 67 yo caucasian female in today with her husband c/o roughly 1 week h/o dysuria. They report she passed a visible and painful kidney stone last week and since then she's been struggling with dysuria, urgency, frequency. He reports some low-grade fevers and chills. She has some lower abdominal pain. No flank pain but does have persistent back pain. Acknowledges also some anorexia and nausea but no vomiting. Denies chest pain or palpitations. Acknowledges worsening fatigue and malaise. Continues to struggle with chronic depression but does not feel it is worsening at this time. Is requesting simplification of her medication regimen   Past Medical History  Diagnosis Date  . Thyroid disease   . Hyperlipidemia   . Hypertension     5 stents  . Diabetes mellitus     type 2  . Hypoxia 10/07/2010  . Skin lesion of back 10/18/2010  . Viral infection characterized by skin and mucous membrane lesions 10/18/2010  . Vitamin D deficiency 11/21/2010  . Back pain 11/21/2010  . Motion sickness 12/04/2010  . UTI (lower urinary tract infection) 09/08/2011  . Hot flashes 10/06/2011  . Bronchitis 02/28/2012  . Abdominal  pain, other specified site 05/05/2012  . Tubular adenoma of colon 2011  . Hypothyroidism   . Depression   . Fibromyalgia   . Coronary artery disease   . Sleep apnea     uses cpap bring mask and tubing for procedure  . GERD (gastroesophageal reflux disease)   . Myocardial infarction   . Heart murmur     MVP  . Arthritis   . Shortness of breath     Past Surgical History  Procedure Laterality Date  . Angioplasty  1980  . Angioplasty  2006    3 stents  .  Angioplasty  2009    2 stents  . Cholecystectomy    . Thyroidectomy  1950    for goiter  . Diverticulitis  1978    exploratory  . Right rotator cuff repair  2004  . Left rotator cuff repair  2004  . Bone fusion left foot  2010    7 screws in foot, bunionectomy  . Hip surgery      large bone spur removed, right  . Back surgery  2004    lumbar discectomy for bulging disc  . Abdominal hysterectomy  1977    partial  . Colonoscopy N/A 07/26/2012    Procedure: COLONOSCOPY;  Surgeon: Hart Carwin, MD;  Location: [facility];  Service: Endoscopy;  Laterality: N/A;    Family History  Problem Relation Age of Onset  . Heart disease Father   . Diabetes Sister   . Hypertension Sister   . Depression Sister   . Cancer Sister     colon cancer s/p colectomy  . Emphysema Brother     smoker  . Diabetes Daughter   . Hypertension Daughter   . Depression Daughter   . Diverticulitis Daughter   . Other Daughter     Trichotrilomania  . Aneurysm Maternal Grandmother     brain aneurysm  . Diabetes Paternal Grandmother   . Hypertension Paternal Grandmother   .  Cancer Paternal Grandmother     colon  . Crohn's disease Paternal Grandfather   . Emphysema Brother     smoker  . Heart disease Brother   . Diabetes Brother   . Cancer Brother     thyroid  . Coronary artery disease Brother     s/p MI's and 7 stents  . Diabetes Brother   . Other Brother     back disease w/ bulging discs  . Colon polyps Brother     esophageal and tongue polyps  . Diabetes Sister   . Other Sister     short term memory loss  . Aneurysm Sister     brain aneursm s/p coiling  . Diabetes Son   . Anxiety disorder Son     History   Social History  . Marital Status: Married    Spouse Name: N/A    Number of Children: N/A  . Years of Education: N/A   Occupational History  . retired Geologist, engineering    Social History Main Topics  . Smoking status: Never Smoker   . Smokeless tobacco: Never Used  . Alcohol  Use: No  . Drug Use: No  . Sexually Active: Yes -- Female partner(s)   Other Topics Concern  . Not on file   Social History Narrative  . No narrative on file    Current Outpatient Prescriptions on File Prior to Visit  Medication Sig Dispense Refill  . albuterol (PROVENTIL HFA;VENTOLIN HFA) 108 (90 BASE) MCG/ACT inhaler Inhale 2 puffs into the lungs every 2 (two) hours as needed for wheezing or shortness of breath (cough).  1 Inhaler  0  . amLODipine-benazepril (LOTREL) 5-20 MG per capsule Take 1 capsule by mouth daily before breakfast.      . aspirin 81 MG tablet Take 81 mg by mouth 2 (two) times daily.       . carvedilol (COREG) 6.25 MG tablet Take 6.25 mg by mouth 2 (two) times daily with a meal.      . Cholecalciferol (VITAMIN D) 2000 UNITS CAPS Take 1 capsule by mouth daily.      . clopidogrel (PLAVIX) 75 MG tablet Take 75 mg by mouth daily before breakfast.      . ezetimibe-simvastatin (VYTORIN) 10-40 MG per tablet Take 1 tablet by mouth at bedtime.      . gabapentin (NEURONTIN) 300 MG capsule Take 600 mg by mouth 2 (two) times daily.      . isosorbide mononitrate (IMDUR) 60 MG 24 hr tablet Take 60 mg by mouth daily before breakfast.      . levothyroxine (SYNTHROID, LEVOTHROID) 25 MCG tablet Take 25 mcg by mouth at bedtime.      . Liraglutide (VICTOZA) 18 MG/3ML SOLN injection Inject 1.8 mg into the skin daily.      . metFORMIN (GLUCOPHAGE) 1000 MG tablet Take 1,000 mg by mouth 2 (two) times daily with a meal.      . Milnacipran (SAVELLA) 50 MG TABS Take 50 mg by mouth 2 (two) times daily.      . Probiotic Product (ALIGN) 4 MG CAPS Take 1 capsule by mouth daily.      . promethazine (PHENERGAN) 25 MG tablet Take 25 mg by mouth every 6 (six) hours as needed for nausea.      . ranitidine (ZANTAC) 300 MG tablet Take 300 mg by mouth at bedtime.       No current facility-administered medications on file prior to visit.    Allergies  Allergen Reactions  . Percocet  (Oxycodone-Acetaminophen) Itching    Review of Systems  Review of Systems  Constitutional: Positive for chills. Negative for fever and malaise/fatigue.  HENT: Negative for congestion.   Eyes: Negative for discharge.  Respiratory: Negative for shortness of breath.   Cardiovascular: Negative for chest pain, palpitations and leg swelling.  Gastrointestinal: Positive for nausea and abdominal pain. Negative for vomiting, diarrhea, constipation, blood in stool and melena.  Genitourinary: Positive for urgency and frequency. Negative for dysuria and hematuria.  Musculoskeletal: Negative for falls.  Skin: Negative for rash.  Neurological: Negative for loss of consciousness and headaches.  Endo/Heme/Allergies: Negative for polydipsia.  Psychiatric/Behavioral: Negative for depression and suicidal ideas. The patient is not nervous/anxious and does not have insomnia.     Objective  BP 140/96  Pulse 99  Temp(Src) 97.6 F (36.4 C) (Oral)  Ht 5\' 1"  (1.549 m)  Wt 247 lb 1.9 oz (112.093 kg)  BMI 46.72 kg/m2  SpO2 94%  Physical Exam  Physical Exam  Constitutional: She is oriented to person, place, and time and well-developed, well-nourished, and in no distress. No distress.  HENT:  Head: Normocephalic and atraumatic.  Eyes: Conjunctivae are normal.  Neck: Neck supple. No thyromegaly present.  Cardiovascular: Normal rate, regular rhythm and normal heart sounds.   Pulmonary/Chest: Effort normal and breath sounds normal. She has no wheezes.  Abdominal: She exhibits no distension and no mass.  Musculoskeletal: She exhibits no edema.  Lymphadenopathy:    She has no cervical adenopathy.  Neurological: She is alert and oriented to person, place, and time.  Skin: Skin is warm and dry. No rash noted. She is not diaphoretic.  Psychiatric: Memory, affect and judgment normal.    Lab Results  Component Value Date   TSH 3.36 04/06/2012   Lab Results  Component Value Date   WBC 10.2 04/06/2012    HGB 11.8* 04/06/2012   HCT 36.0 04/06/2012   MCV 92.9 04/06/2012   PLT 232.0 04/06/2012   Lab Results  Component Value Date   CREATININE 1.0 04/06/2012   BUN 25* 04/06/2012   NA 140 04/06/2012   K 4.1 04/06/2012   CL 101 04/06/2012   CO2 31 04/06/2012   Lab Results  Component Value Date   ALT 21 04/06/2012   AST 19 04/06/2012   ALKPHOS 82 04/06/2012   BILITOT 0.5 04/06/2012   Lab Results  Component Value Date   CHOL 197 04/06/2012   Lab Results  Component Value Date   HDL 45.80 04/06/2012   Lab Results  Component Value Date   LDLCALC 114* 04/06/2012   Lab Results  Component Value Date   TRIG 187.0* 04/06/2012   Lab Results  Component Value Date   CHOLHDL 4 04/06/2012     Assessment & Plan  ESSENTIAL HYPERTENSION, BENIGN Mild elevation with acute illness, will continue to monitor  Family history of malignant neoplasm of gastrointestinal tract Underwent colonoscopy yesterday, tolerated procedure well  HYPERLIPIDEMIA Patient is fasting today will check lipids  UTI (lower urinary tract infection) Has had urinary burning, frequency and urgency since last week when she passed a stone. Urine culture ordered today, started on Ciprofloxacin 250 mg bid, encouraged vigorous hydration and cranberry, then put on Nitrofurantoin 50 mg qhs to start after Cipro complete and referred to urology for consideration of kidney stones and recurrent uti. Continue probiotics  ANXIETY DEPRESSION Continues to struggle but with Savella on board will drop her Lexapro to 20 mg just once a day  Hot flashes Generally better and patient interested in stopping some meds so hormones d/c ed today.

## 2012-07-27 NOTE — Assessment & Plan Note (Signed)
Continues to struggle but with Savella on board will drop her Lexapro to 20 mg just once a day

## 2012-07-27 NOTE — Assessment & Plan Note (Signed)
Patient is fasting today will check lipids

## 2012-07-27 NOTE — Assessment & Plan Note (Signed)
Generally better and patient interested in stopping some meds so hormones d/c ed today.

## 2012-07-27 NOTE — Assessment & Plan Note (Signed)
Mild elevation with acute illness, will continue to monitor

## 2012-07-28 LAB — URINALYSIS
Bilirubin Urine: NEGATIVE
Glucose, UA: NEGATIVE mg/dL
Leukocytes, UA: NEGATIVE
pH: 5 (ref 5.0–8.0)

## 2012-07-28 NOTE — Progress Notes (Signed)
Quick Note:  Patients husband Informed and voiced understanding ______

## 2012-07-29 LAB — URINE CULTURE: Colony Count: 5000

## 2012-08-04 ENCOUNTER — Telehealth: Payer: Self-pay

## 2012-08-04 NOTE — Telephone Encounter (Signed)
PA sent for Dexilant

## 2012-08-09 NOTE — Telephone Encounter (Signed)
PA has been denied.  

## 2012-08-12 ENCOUNTER — Other Ambulatory Visit: Payer: Self-pay | Admitting: Family Medicine

## 2012-08-18 ENCOUNTER — Ambulatory Visit: Payer: Medicare Other | Admitting: Family Medicine

## 2012-08-27 ENCOUNTER — Ambulatory Visit (INDEPENDENT_AMBULATORY_CARE_PROVIDER_SITE_OTHER): Payer: Medicare Other | Admitting: Family Medicine

## 2012-08-27 ENCOUNTER — Encounter: Payer: Self-pay | Admitting: Family Medicine

## 2012-08-27 VITALS — BP 121/73 | HR 100 | Temp 97.9°F | Ht 61.0 in | Wt 247.8 lb

## 2012-08-27 DIAGNOSIS — I10 Essential (primary) hypertension: Secondary | ICD-10-CM

## 2012-08-27 DIAGNOSIS — N390 Urinary tract infection, site not specified: Secondary | ICD-10-CM

## 2012-08-27 DIAGNOSIS — R197 Diarrhea, unspecified: Secondary | ICD-10-CM

## 2012-08-27 DIAGNOSIS — R110 Nausea: Secondary | ICD-10-CM

## 2012-08-27 DIAGNOSIS — N200 Calculus of kidney: Secondary | ICD-10-CM

## 2012-08-27 DIAGNOSIS — K219 Gastro-esophageal reflux disease without esophagitis: Secondary | ICD-10-CM

## 2012-08-27 DIAGNOSIS — R630 Anorexia: Secondary | ICD-10-CM

## 2012-08-27 DIAGNOSIS — R109 Unspecified abdominal pain: Secondary | ICD-10-CM

## 2012-08-27 DIAGNOSIS — J029 Acute pharyngitis, unspecified: Secondary | ICD-10-CM

## 2012-08-27 LAB — POCT URINALYSIS DIPSTICK
Blood, UA: NEGATIVE
Protein, UA: NEGATIVE
pH, UA: 5.5

## 2012-08-27 LAB — CBC
HCT: 35.6 % — ABNORMAL LOW (ref 36.0–46.0)
MCHC: 33.4 g/dL (ref 30.0–36.0)
MCV: 92.5 fl (ref 78.0–100.0)
RBC: 3.85 Mil/uL — ABNORMAL LOW (ref 3.87–5.11)
RDW: 14.5 % (ref 11.5–14.6)

## 2012-08-27 LAB — RENAL FUNCTION PANEL
Calcium: 8.7 mg/dL (ref 8.4–10.5)
Creatinine, Ser: 0.7 mg/dL (ref 0.4–1.2)
Glucose, Bld: 108 mg/dL — ABNORMAL HIGH (ref 70–99)
Phosphorus: 2.6 mg/dL (ref 2.3–4.6)
Potassium: 3.9 mEq/L (ref 3.5–5.1)
Sodium: 137 mEq/L (ref 135–145)

## 2012-08-27 LAB — TSH: TSH: 2.99 u[IU]/mL (ref 0.35–5.50)

## 2012-08-27 LAB — HEPATIC FUNCTION PANEL
ALT: 18 U/L (ref 0–35)
Total Bilirubin: 0.3 mg/dL (ref 0.3–1.2)

## 2012-08-27 LAB — H. PYLORI ANTIBODY, IGG: H Pylori IgG: NEGATIVE

## 2012-08-27 MED ORDER — ONDANSETRON 8 MG PO TBDP: 8.0000 mg | ORAL_TABLET | Freq: Three times a day (TID) | ORAL | Status: DC | PRN

## 2012-08-27 MED ORDER — PANTOPRAZOLE SODIUM 40 MG PO TBEC: 40.0000 mg | DELAYED_RELEASE_TABLET | Freq: Every day | ORAL | Status: DC

## 2012-08-27 NOTE — Patient Instructions (Addendum)
Boost, carnation instant instant breakfast or Pedialyte   Gastroesophageal Reflux Disease, Adult Gastroesophageal reflux disease (GERD) happens when acid from your stomach flows up into the esophagus. When acid comes in contact with the esophagus, the acid causes soreness (inflammation) in the esophagus. Over time, GERD may create small holes (ulcers) in the lining of the esophagus. CAUSES   Increased body weight. This puts pressure on the stomach, making acid rise from the stomach into the esophagus.  Smoking. This increases acid production in the stomach.  Drinking alcohol. This causes decreased pressure in the lower esophageal sphincter (valve or ring of muscle between the esophagus and stomach), allowing acid from the stomach into the esophagus.  Late evening meals and a full stomach. This increases pressure and acid production in the stomach.  A malformed lower esophageal sphincter. Sometimes, no cause is found. SYMPTOMS   Burning pain in the lower part of the mid-chest behind the breastbone and in the mid-stomach area. This may occur twice a week or more often.  Trouble swallowing.  Sore throat.  Dry cough.  Asthma-like symptoms including chest tightness, shortness of breath, or wheezing. DIAGNOSIS  Your caregiver may be able to diagnose GERD based on your symptoms. In some cases, X-rays and other tests may be done to check for complications or to check the condition of your stomach and esophagus. TREATMENT  Your caregiver may recommend over-the-counter or prescription medicines to help decrease acid production. Ask your caregiver before starting or adding any new medicines.  HOME CARE INSTRUCTIONS   Change the factors that you can control. Ask your caregiver for guidance concerning weight loss, quitting smoking, and alcohol consumption.  Avoid foods and drinks that make your symptoms worse, such as:  Caffeine or alcoholic drinks.  Chocolate.  Peppermint or mint  flavorings.  Garlic and onions.  Spicy foods.  Citrus fruits, such as oranges, lemons, or limes.  Tomato-based foods such as sauce, chili, salsa, and pizza.  Fried and fatty foods.  Avoid lying down for the 3 hours prior to your bedtime or prior to taking a nap.  Eat small, frequent meals instead of large meals.  Wear loose-fitting clothing. Do not wear anything tight around your waist that causes pressure on your stomach.  Raise the head of your bed 6 to 8 inches with wood blocks to help you sleep. Extra pillows will not help.  Only take over-the-counter or prescription medicines for pain, discomfort, or fever as directed by your caregiver.  Do not take aspirin, ibuprofen, or other nonsteroidal anti-inflammatory drugs (NSAIDs). SEEK IMMEDIATE MEDICAL CARE IF:   You have pain in your arms, neck, jaw, teeth, or back.  Your pain increases or changes in intensity or duration.  You develop nausea, vomiting, or sweating (diaphoresis).  You develop shortness of breath, or you faint.  Your vomit is green, yellow, black, or looks like coffee grounds or blood.  Your stool is red, bloody, or black. These symptoms could be signs of other problems, such as heart disease, gastric bleeding, or esophageal bleeding. MAKE SURE YOU:   Understand these instructions.  Will watch your condition.  Will get help right away if you are not doing well or get worse. Document Released: 01/29/2005 Document Revised: 07/14/2011 Document Reviewed: 11/08/2010 [facility] Patient Information 2013 [place], [place].

## 2012-08-28 ENCOUNTER — Encounter: Payer: Self-pay | Admitting: Family Medicine

## 2012-08-28 ENCOUNTER — Other Ambulatory Visit: Payer: Self-pay | Admitting: Family Medicine

## 2012-08-28 DIAGNOSIS — R112 Nausea with vomiting, unspecified: Secondary | ICD-10-CM | POA: Insufficient documentation

## 2012-08-28 DIAGNOSIS — J029 Acute pharyngitis, unspecified: Secondary | ICD-10-CM | POA: Insufficient documentation

## 2012-08-28 DIAGNOSIS — N200 Calculus of kidney: Secondary | ICD-10-CM

## 2012-08-28 HISTORY — DX: Calculus of kidney: N20.0

## 2012-08-28 NOTE — Assessment & Plan Note (Signed)
No sign of obstruction at this time, encouraged adequate hydration and with serum uric acid will start Uloric if insurance approves.

## 2012-08-28 NOTE — Assessment & Plan Note (Signed)
Urinalysis unremarkable but did culture urine due to patient history

## 2012-08-28 NOTE — Assessment & Plan Note (Signed)
Given zofran to use prn

## 2012-08-28 NOTE — Assessment & Plan Note (Signed)
Well controlled, no change in meds 

## 2012-08-28 NOTE — Assessment & Plan Note (Signed)
Strep culture negative 

## 2012-08-28 NOTE — Progress Notes (Signed)
Patient ID: Amber [name], female   DOB: 1945/10/21, 67 y.o.   MRN: [id number] Amber [name] [id number] 03/18/1946 08/28/2012      Progress Note-Follow Up  Subjective  Chief Complaint  Chief Complaint  Patient presents with  . Nausea  . Diarrhea    HPI  Patient is a 67 year old Caucasian female in today by her husband. A report she has no oblique. She's been weak and tired. She's been sleeping roughly 14 hours a day. She's had poor appetite and nausea. She's complaining of a one-week history of sore throat. She did trip recently and fall on her hips but denies any syncope or weakness. No polyuria or polydipsia. No dysuria or hematuria. No fevers or chills. No cough, shortness of breath, chest pain or palpitations. No bony tenderness   Past Medical History  Diagnosis Date  . Thyroid disease   . Hyperlipidemia   . Hypertension     5 stents  . Diabetes mellitus     type 2  . Hypoxia 10/07/2010  . Skin lesion of back 10/18/2010  . Viral infection characterized by skin and mucous membrane lesions 10/18/2010  . Vitamin D deficiency 11/21/2010  . Back pain 11/21/2010  . Motion sickness 12/04/2010  . UTI (lower urinary tract infection) 09/08/2011  . Hot flashes 10/06/2011  . Bronchitis 02/28/2012  . Abdominal  pain, other specified site 05/05/2012  . Tubular adenoma of colon 2011  . Hypothyroidism   . Depression   . Fibromyalgia   . Coronary artery disease   . Sleep apnea     uses cpap bring mask and tubing for procedure  . GERD (gastroesophageal reflux disease)   . Myocardial infarction   . Heart murmur     MVP  . Arthritis   . Shortness of breath   . Renal lithiasis 08/28/2012  . Nausea alone 08/28/2012    Past Surgical History  Procedure Laterality Date  . Angioplasty  1980  . Angioplasty  2006    3 stents  . Angioplasty  2009    2 stents  . Cholecystectomy    . Thyroidectomy  1950    for goiter  . Diverticulitis  1978    exploratory  . Right rotator cuff repair   2004  . Left rotator cuff repair  2004  . Bone fusion left foot  2010    7 screws in foot, bunionectomy  . Hip surgery      large bone spur removed, right  . Back surgery  2004    lumbar discectomy for bulging disc  . Abdominal hysterectomy  1977    partial  . Colonoscopy N/A 07/26/2012    Procedure: COLONOSCOPY;  Surgeon: Hart Carwin, MD;  Location: [facility];  Service: Endoscopy;  Laterality: N/A;    Family History  Problem Relation Age of Onset  . Heart disease Father   . Diabetes Sister   . Hypertension Sister   . Depression Sister   . Cancer Sister     colon cancer s/p colectomy  . Emphysema Brother     smoker  . Diabetes Daughter   . Hypertension Daughter   . Depression Daughter   . Diverticulitis Daughter   . Other Daughter     Trichotrilomania  . Aneurysm Maternal Grandmother     brain aneurysm  . Diabetes Paternal Grandmother   . Hypertension Paternal Grandmother   . Cancer Paternal Grandmother     colon  . Crohn's disease Paternal Grandfather   .  Emphysema Brother     smoker  . Heart disease Brother   . Diabetes Brother   . Cancer Brother     thyroid  . Coronary artery disease Brother     s/p MI's and 7 stents  . Diabetes Brother   . Other Brother     back disease w/ bulging discs  . Colon polyps Brother     esophageal and tongue polyps  . Diabetes Sister   . Other Sister     short term memory loss  . Aneurysm Sister     brain aneursm s/p coiling  . Diabetes Son   . Anxiety disorder Son     History   Social History  . Marital Status: Married    Spouse Name: N/A    Number of Children: N/A  . Years of Education: N/A   Occupational History  . retired Geologist, engineering    Social History Main Topics  . Smoking status: Never Smoker   . Smokeless tobacco: Never Used  . Alcohol Use: No  . Drug Use: No  . Sexually Active: Yes -- Female partner(s)   Other Topics Concern  . Not on file   Social History Narrative  . No narrative on file     Current Outpatient Prescriptions on File Prior to Visit  Medication Sig Dispense Refill  . albuterol (PROVENTIL HFA;VENTOLIN HFA) 108 (90 BASE) MCG/ACT inhaler Inhale 2 puffs into the lungs every 2 (two) hours as needed for wheezing or shortness of breath (cough).  1 Inhaler  0  . amLODipine-benazepril (LOTREL) 5-20 MG per capsule Take 1 capsule by mouth daily before breakfast.      . aspirin 81 MG tablet Take 81 mg by mouth 2 (two) times daily.       . carvedilol (COREG) 6.25 MG tablet Take 6.25 mg by mouth 2 (two) times daily with a meal.      . Cholecalciferol (VITAMIN D) 2000 UNITS CAPS Take 1 capsule by mouth daily.      . ciprofloxacin (CIPRO) 250 MG tablet Take 1 tablet (250 mg total) by mouth 2 (two) times daily.  10 tablet  0  . clopidogrel (PLAVIX) 75 MG tablet Take 75 mg by mouth daily before breakfast.      . dexlansoprazole (DEXILANT) 60 MG capsule Take 1 capsule (60 mg total) by mouth daily. Failed Protonix, Nexium  30 capsule  5  . escitalopram (LEXAPRO) 20 MG tablet Take 1 tablet (20 mg total) by mouth daily.      Marland Kitchen ezetimibe-simvastatin (VYTORIN) 10-40 MG per tablet Take 1 tablet by mouth at bedtime.      . gabapentin (NEURONTIN) 300 MG capsule Take 600 mg by mouth 2 (two) times daily.      . isosorbide mononitrate (IMDUR) 60 MG 24 hr tablet Take 60 mg by mouth daily before breakfast.      . levothyroxine (SYNTHROID, LEVOTHROID) 25 MCG tablet Take 25 mcg by mouth at bedtime.      . Liraglutide (VICTOZA) 18 MG/3ML SOLN injection Inject 1.8 mg into the skin daily.      . metFORMIN (GLUCOPHAGE) 1000 MG tablet Take 1,000 mg by mouth 2 (two) times daily with a meal.      . Milnacipran (SAVELLA) 50 MG TABS Take 50 mg by mouth 2 (two) times daily.      . nitrofurantoin (MACRODANTIN) 50 MG capsule Take 1 capsule (50 mg total) by mouth at bedtime.  30 capsule  3  . Probiotic  Product (ALIGN) 4 MG CAPS Take 1 capsule by mouth daily.      . promethazine (PHENERGAN) 25 MG tablet Take 25  mg by mouth every 6 (six) hours as needed for nausea.      . promethazine (PHENERGAN) 25 MG tablet TAKE 1 TABLET BY MOUTH EVERY 6 HOURS AS NEEDED FOR NAUSEA  30 tablet  0  . ranitidine (ZANTAC) 300 MG tablet Take 300 mg by mouth at bedtime.       No current facility-administered medications on file prior to visit.    Allergies  Allergen Reactions  . Percocet (Oxycodone-Acetaminophen) Itching    Review of Systems  Review of Systems  Constitutional: Positive for chills and malaise/fatigue. Negative for fever.  HENT: Positive for sore throat. Negative for congestion.   Eyes: Negative for discharge.  Respiratory: Negative for shortness of breath.   Cardiovascular: Negative for chest pain, palpitations and leg swelling.  Gastrointestinal: Positive for nausea, abdominal pain and diarrhea.  Genitourinary: Negative for dysuria.  Musculoskeletal: Positive for myalgias, back pain, joint pain and falls.  Skin: Negative for rash.  Neurological: Negative for loss of consciousness and headaches.  Endo/Heme/Allergies: Negative for polydipsia.  Psychiatric/Behavioral: Negative for depression and suicidal ideas. The patient is not nervous/anxious and does not have insomnia.     Objective  BP 121/73  Pulse 100  Temp(Src) 97.9 F (36.6 C) (Oral)  Ht 5\' 1"  (1.549 m)  Wt 247 lb 12 oz (112.379 kg)  BMI 46.84 kg/m2  SpO2 90%  Physical Exam  Physical Exam  Constitutional: She is oriented to person, place, and time and well-developed, well-nourished, and in no distress. No distress.  Ill appearing  HENT:  Head: Normocephalic and atraumatic.  Eyes: Conjunctivae are normal.  Neck: Neck supple. No thyromegaly present.  Cardiovascular: Normal rate, regular rhythm and normal heart sounds.   Pulmonary/Chest: Effort normal and breath sounds normal. She has no wheezes.  Abdominal: Soft. Bowel sounds are normal. She exhibits no distension and no mass. There is no tenderness.  Musculoskeletal: She  exhibits no edema.  Lymphadenopathy:    She has no cervical adenopathy.  Neurological: She is alert and oriented to person, place, and time.  Skin: Skin is warm and dry. No rash noted. She is not diaphoretic.  Psychiatric: Memory, affect and judgment normal.    Lab Results  Component Value Date   TSH 2.99 08/27/2012   Lab Results  Component Value Date   WBC 10.9* 08/27/2012   HGB 11.9* 08/27/2012   HCT 35.6* 08/27/2012   MCV 92.5 08/27/2012   PLT 264.0 08/27/2012   Lab Results  Component Value Date   CREATININE 0.7 08/27/2012   BUN 16 08/27/2012   NA 137 08/27/2012   K 3.9 08/27/2012   CL 101 08/27/2012   CO2 30 08/27/2012   Lab Results  Component Value Date   ALT 18 08/27/2012   AST 20 08/27/2012   ALKPHOS 78 08/27/2012   BILITOT 0.3 08/27/2012   Lab Results  Component Value Date   CHOL 153 07/27/2012   Lab Results  Component Value Date   HDL 37* 07/27/2012   Lab Results  Component Value Date   LDLCALC 66 07/27/2012   Lab Results  Component Value Date   TRIG 252* 07/27/2012   Lab Results  Component Value Date   CHOLHDL 4.1 07/27/2012     Assessment & Plan  ESSENTIAL HYPERTENSION, BENIGN Well controlled, no change in meds   UTI (lower urinary tract infection) Urinalysis  unremarkable but did culture urine due to patient history  Acute pharyngitis Strep culture negative  Abdominal  pain, other specified site No sign of obstruction at this time, encouraged adequate hydration and with serum uric acid will start Uloric if insurance approves.  Nausea alone Given zofran to use prn

## 2012-08-29 LAB — CULTURE, GROUP A STREP: Organism ID, Bacteria: NORMAL

## 2012-08-30 LAB — URINE CULTURE: Colony Count: 80000

## 2012-08-30 NOTE — Telephone Encounter (Signed)
Please Advise...  Pharmacy requested Victoza 18mg /45ml   inject 1.8mg  into skin daily 6 syringes

## 2012-08-31 MED ORDER — FEBUXOSTAT 40 MG PO TABS: 40.0000 mg | ORAL_TABLET | Freq: Every day | ORAL | Status: DC

## 2012-08-31 NOTE — Addendum Note (Signed)
Addended by: Court Joy on: 08/31/2012 03:18 PM   Modules accepted: Orders

## 2012-09-02 ENCOUNTER — Telehealth: Payer: Self-pay

## 2012-09-02 ENCOUNTER — Encounter: Payer: Self-pay | Admitting: Family Medicine

## 2012-09-02 ENCOUNTER — Ambulatory Visit (INDEPENDENT_AMBULATORY_CARE_PROVIDER_SITE_OTHER): Payer: Medicare Other | Admitting: Family Medicine

## 2012-09-02 VITALS — BP 122/82 | HR 98 | Temp 98.8°F | Ht 61.0 in | Wt 244.0 lb

## 2012-09-02 DIAGNOSIS — E119 Type 2 diabetes mellitus without complications: Secondary | ICD-10-CM

## 2012-09-02 DIAGNOSIS — N200 Calculus of kidney: Secondary | ICD-10-CM

## 2012-09-02 DIAGNOSIS — R110 Nausea: Secondary | ICD-10-CM

## 2012-09-02 DIAGNOSIS — K219 Gastro-esophageal reflux disease without esophagitis: Secondary | ICD-10-CM

## 2012-09-02 DIAGNOSIS — I10 Essential (primary) hypertension: Secondary | ICD-10-CM

## 2012-09-02 DIAGNOSIS — E785 Hyperlipidemia, unspecified: Secondary | ICD-10-CM

## 2012-09-02 DIAGNOSIS — F341 Dysthymic disorder: Secondary | ICD-10-CM

## 2012-09-02 MED ORDER — MILNACIPRAN HCL 50 MG PO TABS: 50.0000 mg | ORAL_TABLET | Freq: Two times a day (BID) | ORAL | Status: DC

## 2012-09-02 MED ORDER — METFORMIN HCL 1000 MG PO TABS: 1000.0000 mg | ORAL_TABLET | Freq: Two times a day (BID) | ORAL | Status: DC

## 2012-09-02 MED ORDER — TRAMADOL HCL 50 MG PO TABS: 50.0000 mg | ORAL_TABLET | Freq: Two times a day (BID) | ORAL | Status: DC

## 2012-09-02 MED ORDER — AMLODIPINE BESY-BENAZEPRIL HCL 5-20 MG PO CAPS: 1.0000 | ORAL_CAPSULE | Freq: Every day | ORAL | Status: DC

## 2012-09-02 MED ORDER — CARVEDILOL 6.25 MG PO TABS: 6.2500 mg | ORAL_TABLET | Freq: Two times a day (BID) | ORAL | Status: DC

## 2012-09-02 MED ORDER — DEXLANSOPRAZOLE 60 MG PO CPDR: 60.0000 mg | DELAYED_RELEASE_CAPSULE | Freq: Every day | ORAL | Status: DC

## 2012-09-02 MED ORDER — ALLOPURINOL 100 MG PO TABS: 100.0000 mg | ORAL_TABLET | Freq: Every day | ORAL | Status: DC

## 2012-09-02 MED ORDER — RANITIDINE HCL 300 MG PO TABS: 300.0000 mg | ORAL_TABLET | Freq: Every day | ORAL | Status: DC

## 2012-09-02 MED ORDER — ISOSORBIDE MONONITRATE ER 60 MG PO TB24: 60.0000 mg | ORAL_TABLET | Freq: Every day | ORAL | Status: DC

## 2012-09-02 MED ORDER — GABAPENTIN 300 MG PO CAPS: 600.0000 mg | ORAL_CAPSULE | Freq: Two times a day (BID) | ORAL | Status: DC

## 2012-09-02 MED ORDER — EZETIMIBE-SIMVASTATIN 10-40 MG PO TABS: 1.0000 | ORAL_TABLET | Freq: Every day | ORAL | Status: DC

## 2012-09-02 NOTE — Telephone Encounter (Signed)
Message copied by Court Joy on Thu Sep 02, 2012  5:16 PM ------      Message from: Court Joy      Created: Tue Aug 31, 2012  3:18 PM       Left message on patients cell phone to return my call.            RX sent to pharmacy ------

## 2012-09-02 NOTE — Patient Instructions (Addendum)

## 2012-09-02 NOTE — Telephone Encounter (Signed)
Pt informed at appt.

## 2012-09-03 ENCOUNTER — Telehealth: Payer: Self-pay

## 2012-09-03 NOTE — Telephone Encounter (Signed)
Patients spouse called stating pt is dreaming about suicide and having very dark feelings about herself. Pts spouse states he only gave her 1 Savella instead of 2 to see if this helps. Please advise if you think that will help or not take the [facility]?

## 2012-09-03 NOTE — Telephone Encounter (Signed)
pts spouse informed and voiced understanding

## 2012-09-03 NOTE — Telephone Encounter (Signed)
So stick with 1 Savella for now and then can stop in a couple weeks if it is not totally improved but the decrease seems to be helping. Come in if no improvement, seek care as needed

## 2012-09-04 ENCOUNTER — Encounter: Payer: Self-pay | Admitting: Family Medicine

## 2012-09-04 NOTE — Assessment & Plan Note (Signed)
Very somber mood recently

## 2012-09-04 NOTE — Assessment & Plan Note (Signed)
Well controlled no changes 

## 2012-09-04 NOTE — Assessment & Plan Note (Signed)
Improved now that stones have passed

## 2012-09-04 NOTE — Assessment & Plan Note (Signed)
Just passed several stones and brings them in for inspection. Started on Allopurinol 100 mg today

## 2012-09-04 NOTE — Progress Notes (Signed)
Patient ID: Amber [name], female   DOB: Feb 26, 1946, 67 y.o.   MRN: [id number] REMEDY CORPORAN [id number] 1945-07-18 09/04/2012      Progress Note-Follow Up  Subjective  Chief Complaint  Chief Complaint  Patient presents with  . Follow-up    5 week     HPI  Patient is a 67 year old Caucasian female who is in today with her husband. They bring her back in several stones in it but she's recently past. He reports and she passed her kidney stone she feels much better. Her nausea is improved her pain is improved her fevers have improved. Most notably she still has malaise and low mood. No chest pain or palpitations. No shortness of breath. No GI complaints otherwise noted. Does acknowledge her sugars are running high over the past week while she has been dealing with her kidney stones.  Past Medical History  Diagnosis Date  . Thyroid disease   . Hyperlipidemia   . Hypertension     5 stents  . Diabetes mellitus     type 2  . Hypoxia 10/07/2010  . Skin lesion of back 10/18/2010  . Viral infection characterized by skin and mucous membrane lesions 10/18/2010  . Vitamin D deficiency 11/21/2010  . Back pain 11/21/2010  . Motion sickness 12/04/2010  . UTI (lower urinary tract infection) 09/08/2011  . Hot flashes 10/06/2011  . Bronchitis 02/28/2012  . Abdominal  pain, other specified site 05/05/2012  . Tubular adenoma of colon 2011  . Hypothyroidism   . Depression   . Fibromyalgia   . Coronary artery disease   . Sleep apnea     uses cpap bring mask and tubing for procedure  . GERD (gastroesophageal reflux disease)   . Myocardial infarction   . Heart murmur     MVP  . Arthritis   . Shortness of breath   . Renal lithiasis 08/28/2012  . Nausea alone 08/28/2012    Past Surgical History  Procedure Laterality Date  . Angioplasty  1980  . Angioplasty  2006    3 stents  . Angioplasty  2009    2 stents  . Cholecystectomy    . Thyroidectomy  1950    for goiter  . Diverticulitis  1978     exploratory  . Right rotator cuff repair  2004  . Left rotator cuff repair  2004  . Bone fusion left foot  2010    7 screws in foot, bunionectomy  . Hip surgery      large bone spur removed, right  . Back surgery  2004    lumbar discectomy for bulging disc  . Abdominal hysterectomy  1977    partial  . Colonoscopy N/A 07/26/2012    Procedure: COLONOSCOPY;  Surgeon: Hart Carwin, MD;  Location: [facility];  Service: Endoscopy;  Laterality: N/A;    Family History  Problem Relation Age of Onset  . Heart disease Father   . Diabetes Sister   . Hypertension Sister   . Depression Sister   . Cancer Sister     colon cancer s/p colectomy  . Emphysema Brother     smoker  . Diabetes Daughter   . Hypertension Daughter   . Depression Daughter   . Diverticulitis Daughter   . Other Daughter     Trichotrilomania  . Aneurysm Maternal Grandmother     brain aneurysm  . Diabetes Paternal Grandmother   . Hypertension Paternal Grandmother   . Cancer Paternal Grandmother  colon  . Crohn's disease Paternal Grandfather   . Emphysema Brother     smoker  . Heart disease Brother   . Diabetes Brother   . Cancer Brother     thyroid  . Coronary artery disease Brother     s/p MI's and 7 stents  . Diabetes Brother   . Other Brother     back disease w/ bulging discs  . Colon polyps Brother     esophageal and tongue polyps  . Diabetes Sister   . Other Sister     short term memory loss  . Aneurysm Sister     brain aneursm s/p coiling  . Diabetes Son   . Anxiety disorder Son     History   Social History  . Marital Status: Married    Spouse Name: N/A    Number of Children: N/A  . Years of Education: N/A   Occupational History  . retired Geologist, engineering    Social History Main Topics  . Smoking status: Never Smoker   . Smokeless tobacco: Never Used  . Alcohol Use: No  . Drug Use: No  . Sexually Active: Yes -- Female partner(s)   Other Topics Concern  . Not on file    Social History Narrative  . No narrative on file    Current Outpatient Prescriptions on File Prior to Visit  Medication Sig Dispense Refill  . albuterol (PROVENTIL HFA;VENTOLIN HFA) 108 (90 BASE) MCG/ACT inhaler Inhale 2 puffs into the lungs every 2 (two) hours as needed for wheezing or shortness of breath (cough).  1 Inhaler  0  . aspirin 81 MG tablet Take 81 mg by mouth 2 (two) times daily.       . Cholecalciferol (VITAMIN D) 2000 UNITS CAPS Take 1 capsule by mouth daily.      . clopidogrel (PLAVIX) 75 MG tablet Take 75 mg by mouth daily before breakfast.      . escitalopram (LEXAPRO) 20 MG tablet Take 1 tablet (20 mg total) by mouth daily.      Marland Kitchen levothyroxine (SYNTHROID, LEVOTHROID) 25 MCG tablet Take 25 mcg by mouth at bedtime.      . Liraglutide (VICTOZA) 18 MG/3ML SOLN injection Inject 1.8 mg into the skin daily.      . nitrofurantoin (MACRODANTIN) 50 MG capsule Take 1 capsule (50 mg total) by mouth at bedtime.  30 capsule  3  . ondansetron (ZOFRAN-ODT) 8 MG disintegrating tablet Take 1 tablet (8 mg total) by mouth every 8 (eight) hours as needed for nausea.  40 tablet  1  . Probiotic Product (ALIGN) 4 MG CAPS Take 1 capsule by mouth daily.      . promethazine (PHENERGAN) 25 MG tablet Take 25 mg by mouth every 6 (six) hours as needed for nausea.      Marland Kitchen VICTOZA 18 MG/3ML SOLN injection INJECT 0.3 MLS (1.8 MG TOTAL) INTO THE SKIN DAILY.  18 mL  3   No current facility-administered medications on file prior to visit.    Allergies  Allergen Reactions  . Percocet (Oxycodone-Acetaminophen) Itching    Review of Systems  Review of Systems  Constitutional: Positive for malaise/fatigue. Negative for fever.  HENT: Negative for congestion.   Eyes: Negative for discharge.  Respiratory: Negative for shortness of breath.   Cardiovascular: Negative for chest pain, palpitations and leg swelling.  Gastrointestinal: Positive for abdominal pain. Negative for nausea and diarrhea.   Genitourinary: Negative for dysuria.  Musculoskeletal: Positive for back pain. Negative for  falls.  Skin: Negative for rash.  Neurological: Negative for loss of consciousness and headaches.  Endo/Heme/Allergies: Negative for polydipsia.  Psychiatric/Behavioral: Positive for depression. Negative for suicidal ideas. The patient is not nervous/anxious and does not have insomnia.     Objective  BP 122/82  Pulse 98  Temp(Src) 98.8 F (37.1 C) (Oral)  Ht 5\' 1"  (1.549 m)  Wt 244 lb 0.6 oz (110.696 kg)  BMI 46.13 kg/m2  SpO2 94%  Physical Exam  Physical Exam  Constitutional: She is oriented to person, place, and time and well-developed, well-nourished, and in no distress. No distress.  HENT:  Head: Normocephalic and atraumatic.  Eyes: Conjunctivae are normal.  Neck: Neck supple. No thyromegaly present.  Cardiovascular: Normal rate, regular rhythm and normal heart sounds.   Pulmonary/Chest: Effort normal and breath sounds normal. She has no wheezes.  Abdominal: She exhibits no distension and no mass.  Musculoskeletal: She exhibits no edema.  Lymphadenopathy:    She has no cervical adenopathy.  Neurological: She is alert and oriented to person, place, and time.  Skin: Skin is warm and dry. No rash noted. She is not diaphoretic.  Psychiatric: Memory, affect and judgment normal.    Lab Results  Component Value Date   TSH 2.99 08/27/2012   Lab Results  Component Value Date   WBC 10.9* 08/27/2012   HGB 11.9* 08/27/2012   HCT 35.6* 08/27/2012   MCV 92.5 08/27/2012   PLT 264.0 08/27/2012   Lab Results  Component Value Date   CREATININE 0.7 08/27/2012   BUN 16 08/27/2012   NA 137 08/27/2012   K 3.9 08/27/2012   CL 101 08/27/2012   CO2 30 08/27/2012   Lab Results  Component Value Date   ALT 18 08/27/2012   AST 20 08/27/2012   ALKPHOS 78 08/27/2012   BILITOT 0.3 08/27/2012   Lab Results  Component Value Date   CHOL 153 07/27/2012   Lab Results  Component Value Date   HDL 37*  07/27/2012   Lab Results  Component Value Date   LDLCALC 66 07/27/2012   Lab Results  Component Value Date   TRIG 252* 07/27/2012   Lab Results  Component Value Date   CHOLHDL 4.1 07/27/2012     Assessment & Plan  ESSENTIAL HYPERTENSION, BENIGN Well controlled no changes  DM Sugars have been up with recent kidney stones. Encouraged to minimize simple carbs but will not alter meds at this time.  ANXIETY DEPRESSION Very somber mood recently  Renal lithiasis Just passed several stones and brings them in for inspection. Started on Allopurinol 100 mg today  Nausea alone Improved now that stones have passed

## 2012-09-04 NOTE — Assessment & Plan Note (Signed)
Sugars have been up with recent kidney stones. Encouraged to minimize simple carbs but will not alter meds at this time.

## 2012-09-24 ENCOUNTER — Other Ambulatory Visit: Payer: Self-pay | Admitting: Family Medicine

## 2012-09-24 NOTE — Telephone Encounter (Signed)
Metformin requested [Last Rx 05.01.14 #60x5]; verified w/James at [facility] that pt has available refills on file-DENIED/SLS

## 2012-10-09 ENCOUNTER — Other Ambulatory Visit: Payer: Self-pay | Admitting: Family Medicine

## 2012-10-11 ENCOUNTER — Telehealth: Payer: Self-pay | Admitting: Family Medicine

## 2012-10-11 NOTE — Telephone Encounter (Signed)
Please advise 

## 2012-10-11 NOTE — Telephone Encounter (Signed)
OK to give verbal order to d/c second O2 tank

## 2012-10-11 NOTE — Telephone Encounter (Signed)
Faxing order to [phone]

## 2012-10-11 NOTE — Telephone Encounter (Signed)
Patients husband called in stating that patient uses the compresser(O2) at night but does not use the other tank. He says that [facility] needs a discharge order from Dr. Abner Greenspan before they will pickup extra tank.   [facility] p:[phone]

## 2012-10-18 ENCOUNTER — Telehealth: Payer: Self-pay | Admitting: *Deleted

## 2012-10-18 NOTE — Telephone Encounter (Signed)
Faxed cardiac clearance back.

## 2012-10-22 ENCOUNTER — Ambulatory Visit (INDEPENDENT_AMBULATORY_CARE_PROVIDER_SITE_OTHER): Payer: Medicare Other | Admitting: Family Medicine

## 2012-10-22 ENCOUNTER — Encounter: Payer: Self-pay | Admitting: Family Medicine

## 2012-10-22 VITALS — BP 124/60 | HR 94 | Temp 98.4°F | Ht 61.0 in | Wt 238.1 lb

## 2012-10-22 DIAGNOSIS — D72829 Elevated white blood cell count, unspecified: Secondary | ICD-10-CM

## 2012-10-22 DIAGNOSIS — N390 Urinary tract infection, site not specified: Secondary | ICD-10-CM

## 2012-10-22 DIAGNOSIS — I10 Essential (primary) hypertension: Secondary | ICD-10-CM

## 2012-10-22 DIAGNOSIS — E119 Type 2 diabetes mellitus without complications: Secondary | ICD-10-CM

## 2012-10-22 DIAGNOSIS — R112 Nausea with vomiting, unspecified: Secondary | ICD-10-CM

## 2012-10-22 DIAGNOSIS — R0609 Other forms of dyspnea: Secondary | ICD-10-CM

## 2012-10-22 DIAGNOSIS — R0600 Dyspnea, unspecified: Secondary | ICD-10-CM

## 2012-10-22 LAB — CBC
HCT: 37.7 % (ref 36.0–46.0)
MCH: 30.8 pg (ref 26.0–34.0)
MCV: 88.7 fL (ref 78.0–100.0)
Platelets: 355 10*3/uL (ref 150–400)
RBC: 4.25 MIL/uL (ref 3.87–5.11)
RDW: 15.9 % — ABNORMAL HIGH (ref 11.5–15.5)
WBC: 11.4 10*3/uL — ABNORMAL HIGH (ref 4.0–10.5)

## 2012-10-22 MED ORDER — AMOXICILLIN-POT CLAVULANATE 875-125 MG PO TABS: 1.0000 | ORAL_TABLET | Freq: Two times a day (BID) | ORAL | Status: DC

## 2012-10-22 NOTE — Patient Instructions (Addendum)
   Dyspnea Shortness of breath (dyspnea) is the feeling of uneasy breathing. Dyspnea should be evaluated promptly. DIAGNOSIS  Many tests may be done to find why you are having shortness of breath. Tests may include:  A chest X-ray.   A lung function test.   Blood tests.   Recordings of the electrical activity of the heart (electrocardiogram).   Exercise testing.   Sound wave images of the heart (a cardiac echocardiogram).   A scan.  A cause for your shortness of breath may not be identified initially. In this case, it is important to have a follow-up exam with your caregiver. HOME CARE INSTRUCTIONS   Do not smoke. Smoking is a common cause of shortness of breath. Ask for help to stop smoking.   Avoid being around chemicals that may bother your breathing, such as paint fumes or dust.   Rest as needed. Slowly begin your usual activities.   If medications were prescribed, take them as directed for the full length of time directed. This includes oxygen and any inhaled medications, if prescribed.   It is very important that you follow up with your caregiver or other physician as directed. Waiting to do so or failure to follow up could result in worsening of your condition, possible disability, or death.   Be sure you understand what to do or who to call if your shortness of breath worsens.  SEEK MEDICAL CARE IF:   Your condition does not improve in the time expected.   You have a hard time doing your normal activities even with rest.   You have any side effects from or problems with medications prescribed.  SEEK IMMEDIATE MEDICAL CARE IF:   You feel your shortness of breath is getting worse.   You feel lightheaded, faint or develop a cough not controlled with medications.   You start coughing up blood.   You get pain with breathing.   You get chest pain or pain in your arms, shoulders or belly (abdomen).   You have a fever.   You are unable to walk up stairs or  exercise the way you normally can.  MAKE SURE YOU:   Understand these instructions.   Will watch your condition.   Will get help right away if you are not doing well or get worse.  Document Released: 05/29/2004 Document Revised: 01/01/2011 Document Reviewed: 09/06/2009 [facility] Patient Information 2012 [place], [place].

## 2012-10-23 LAB — RENAL FUNCTION PANEL
Albumin: 4.3 g/dL (ref 3.5–5.2)
BUN: 17 mg/dL (ref 6–23)
CO2: 28 mEq/L (ref 19–32)
Chloride: 102 mEq/L (ref 96–112)
Creat: 0.8 mg/dL (ref 0.50–1.10)

## 2012-10-23 LAB — URINALYSIS
Ketones, ur: NEGATIVE mg/dL
Nitrite: NEGATIVE
Protein, ur: NEGATIVE mg/dL
Specific Gravity, Urine: 1.022 (ref 1.005–1.030)
Urobilinogen, UA: 1 mg/dL (ref 0.0–1.0)

## 2012-10-24 ENCOUNTER — Encounter: Payer: Self-pay | Admitting: Family Medicine

## 2012-10-24 DIAGNOSIS — D72829 Elevated white blood cell count, unspecified: Secondary | ICD-10-CM

## 2012-10-24 HISTORY — DX: Elevated white blood cell count, unspecified: D72.829

## 2012-10-24 LAB — URINE CULTURE

## 2012-10-24 NOTE — Assessment & Plan Note (Signed)
Patient with notable dental infection, awaiting tooth extraction, grafting and post placement. Is started on Augmentin and probiotics and encouraged to continue with dentist. Due to excessive fatigue and dyspnea will also check an Echo to further r/u other sources. Urine dip unremarkable today

## 2012-10-24 NOTE — Assessment & Plan Note (Signed)
Sugars up some despite poor po intake, encouraged increased hydration, avoid simple carbs and will adjust meds if numbers continue to climb

## 2012-10-24 NOTE — Progress Notes (Signed)
Patient ID: Amber [name], female   DOB: 11-04-1945, 67 y.o.   MRN: [id number] Amber [name] [id number] 1946-03-04 10/24/2012      Progress Note-Follow Up  Subjective  Chief Complaint  Chief Complaint  Patient presents with  . Urinary Tract Infection    shaking, sweating, tired    HPI  Patient is a 67 year old Caucasian female who is here today accompanied by her husband. She is scheduled to have cataract surgery soon. They're here today though because of worsening fatigue, nausea vomiting and falls. She is presently being treated for a dental abscess but unfortunately continues to struggle with malaise, myalgias, tremulousness and sweating. She has weakness and has fallen 3 times. No obvious fevers. There's some urinary frequency but no hematuria or urgency. He stopped Lexapro about 3 weeks ago and she appears to tolerate that fine. No chest pain or palpitations. Does have dyspnea with exertion that is worsening. They also note her blood sugars have been running high in the 2 to 300s recently despite poor by mouth intake  Past Medical History  Diagnosis Date  . Thyroid disease   . Hyperlipidemia   . Hypertension     5 stents  . Diabetes mellitus     type 2  . Hypoxia 10/07/2010  . Skin lesion of back 10/18/2010  . Viral infection characterized by skin and mucous membrane lesions 10/18/2010  . Vitamin D deficiency 11/21/2010  . Back pain 11/21/2010  . Motion sickness 12/04/2010  . UTI (lower urinary tract infection) 09/08/2011  . Hot flashes 10/06/2011  . Bronchitis 02/28/2012  . Abdominal  pain, other specified site 05/05/2012  . Tubular adenoma of colon 2011  . Hypothyroidism   . Depression   . Fibromyalgia   . Coronary artery disease   . Sleep apnea     uses cpap bring mask and tubing for procedure  . GERD (gastroesophageal reflux disease)   . Myocardial infarction   . Heart murmur     MVP  . Arthritis   . Shortness of breath   . Renal lithiasis 08/28/2012  . Nausea  alone 08/28/2012  . Leukocytosis, unspecified 10/24/2012    Past Surgical History  Procedure Laterality Date  . Angioplasty  1980  . Angioplasty  2006    3 stents  . Angioplasty  2009    2 stents  . Cholecystectomy    . Thyroidectomy  1950    for goiter  . Diverticulitis  1978    exploratory  . Right rotator cuff repair  2004  . Left rotator cuff repair  2004  . Bone fusion left foot  2010    7 screws in foot, bunionectomy  . Hip surgery      large bone spur removed, right  . Back surgery  2004    lumbar discectomy for bulging disc  . Abdominal hysterectomy  1977    partial  . Colonoscopy N/A 07/26/2012    Procedure: COLONOSCOPY;  Surgeon: Hart Carwin, MD;  Location: [facility];  Service: Endoscopy;  Laterality: N/A;    Family History  Problem Relation Age of Onset  . Heart disease Father   . Diabetes Sister   . Hypertension Sister   . Depression Sister   . Cancer Sister     colon cancer s/p colectomy  . Emphysema Brother     smoker  . Diabetes Daughter   . Hypertension Daughter   . Depression Daughter   . Diverticulitis Daughter   . Other Daughter  Trichotrilomania  . Aneurysm Maternal Grandmother     brain aneurysm  . Diabetes Paternal Grandmother   . Hypertension Paternal Grandmother   . Cancer Paternal Grandmother     colon  . Crohn's disease Paternal Grandfather   . Emphysema Brother     smoker  . Heart disease Brother   . Diabetes Brother   . Cancer Brother     thyroid  . Coronary artery disease Brother     s/p MI's and 7 stents  . Diabetes Brother   . Other Brother     back disease w/ bulging discs  . Colon polyps Brother     esophageal and tongue polyps  . Diabetes Sister   . Other Sister     short term memory loss  . Aneurysm Sister     brain aneursm s/p coiling  . Diabetes Son   . Anxiety disorder Son     History   Social History  . Marital Status: Married    Spouse Name: N/A    Number of Children: N/A  . Years of  Education: N/A   Occupational History  . retired Geologist, engineering    Social History Main Topics  . Smoking status: Never Smoker   . Smokeless tobacco: Never Used  . Alcohol Use: No  . Drug Use: No  . Sexually Active: Yes -- Female partner(s)   Other Topics Concern  . Not on file   Social History Narrative  . No narrative on file    Current Outpatient Prescriptions on File Prior to Visit  Medication Sig Dispense Refill  . albuterol (PROVENTIL HFA;VENTOLIN HFA) 108 (90 BASE) MCG/ACT inhaler Inhale 2 puffs into the lungs every 2 (two) hours as needed for wheezing or shortness of breath (cough).  1 Inhaler  0  . allopurinol (ZYLOPRIM) 100 MG tablet Take 1 tablet (100 mg total) by mouth daily.  30 tablet  5  . amLODipine-benazepril (LOTREL) 5-20 MG per capsule Take 1 capsule by mouth daily before breakfast.  30 capsule  5  . aspirin 81 MG tablet Take 81 mg by mouth 2 (two) times daily.       . carvedilol (COREG) 6.25 MG tablet Take 1 tablet (6.25 mg total) by mouth 2 (two) times daily with a meal.  60 tablet  5  . Cholecalciferol (VITAMIN D) 2000 UNITS CAPS Take 1 capsule by mouth daily.      . clopidogrel (PLAVIX) 75 MG tablet Take 75 mg by mouth daily before breakfast.      . dexlansoprazole (DEXILANT) 60 MG capsule Take 1 capsule (60 mg total) by mouth daily. Failed Protonix, Nexium  30 capsule  5  . ezetimibe-simvastatin (VYTORIN) 10-40 MG per tablet Take 1 tablet by mouth at bedtime.  30 tablet  5  . gabapentin (NEURONTIN) 300 MG capsule Take 2 capsules (600 mg total) by mouth 2 (two) times daily.  60 capsule  5  . isosorbide mononitrate (IMDUR) 60 MG 24 hr tablet Take 1 tablet (60 mg total) by mouth daily before breakfast.  30 tablet  5  . levothyroxine (SYNTHROID, LEVOTHROID) 25 MCG tablet Take 25 mcg by mouth at bedtime.      . Liraglutide (VICTOZA) 18 MG/3ML SOLN injection Inject 1.8 mg into the skin daily.      . metformin (FORTAMET) 1000 MG (OSM) 24 hr tablet TAKE 2 TABLETS BY  MOUTH EVERY DAY WITH BREAKFAST  60 tablet  2  . Milnacipran (SAVELLA) 50 MG TABS Take 1  tablet (50 mg total) by mouth 2 (two) times daily.  60 tablet  5  . nitrofurantoin (MACRODANTIN) 50 MG capsule Take 1 capsule (50 mg total) by mouth at bedtime.  30 capsule  3  . ondansetron (ZOFRAN-ODT) 8 MG disintegrating tablet Take 1 tablet (8 mg total) by mouth every 8 (eight) hours as needed for nausea.  40 tablet  1  . Probiotic Product (ALIGN) 4 MG CAPS Take 1 capsule by mouth daily.      . promethazine (PHENERGAN) 25 MG tablet Take 25 mg by mouth every 6 (six) hours as needed for nausea.      . ranitidine (ZANTAC) 300 MG tablet Take 1 tablet (300 mg total) by mouth at bedtime.  30 tablet  5  . traMADol (ULTRAM) 50 MG tablet Take 1 tablet (50 mg total) by mouth 2 (two) times daily.  120 tablet  1   No current facility-administered medications on file prior to visit.    Allergies  Allergen Reactions  . Percocet (Oxycodone-Acetaminophen) Itching    Review of Systems  Review of Systems  Constitutional: Positive for malaise/fatigue. Negative for fever.  HENT: Negative for congestion.   Eyes: Negative for discharge.  Respiratory: Negative for shortness of breath.   Cardiovascular: Negative for chest pain, palpitations and leg swelling.  Gastrointestinal: Positive for nausea and vomiting. Negative for abdominal pain and diarrhea.  Genitourinary: Positive for frequency. Negative for dysuria, urgency and hematuria.  Musculoskeletal: Positive for myalgias and falls.  Skin: Negative for rash.  Neurological: Positive for weakness. Negative for loss of consciousness and headaches.  Endo/Heme/Allergies: Positive for polydipsia.  Psychiatric/Behavioral: Negative for depression and suicidal ideas. The patient is not nervous/anxious and does not have insomnia.     Objective  BP 124/60  Pulse 94  Temp(Src) 98.4 F (36.9 C) (Oral)  Ht 5\' 1"  (1.549 m)  Wt 238 lb 1.3 oz (107.992 kg)  BMI 45.01 kg/m2   SpO2 94%  Physical Exam  Physical Exam  Constitutional: She is oriented to person, place, and time and well-developed, well-nourished, and in no distress. No distress.  HENT:  Head: Normocephalic and atraumatic.  Eyes: Conjunctivae are normal.  Neck: Neck supple. No thyromegaly present.  Cardiovascular: Normal rate, regular rhythm and normal heart sounds.   No murmur heard. Pulmonary/Chest: Effort normal and breath sounds normal. She has no wheezes.  Abdominal: She exhibits no distension and no mass.  Musculoskeletal: She exhibits no edema.  Lymphadenopathy:    She has no cervical adenopathy.  Neurological: She is alert and oriented to person, place, and time.  Skin: Skin is warm and dry. No rash noted. She is not diaphoretic.  Psychiatric: Memory, affect and judgment normal.    Lab Results  Component Value Date   TSH 2.99 08/27/2012   Lab Results  Component Value Date   WBC 11.4* 10/22/2012   HGB 13.1 10/22/2012   HCT 37.7 10/22/2012   MCV 88.7 10/22/2012   PLT 355 10/22/2012   Lab Results  Component Value Date   CREATININE 0.80 10/22/2012   BUN 17 10/22/2012   NA 140 10/22/2012   K 4.2 10/22/2012   CL 102 10/22/2012   CO2 28 10/22/2012   Lab Results  Component Value Date   ALT 18 08/27/2012   AST 20 08/27/2012   ALKPHOS 78 08/27/2012   BILITOT 0.3 08/27/2012   Lab Results  Component Value Date   CHOL 153 07/27/2012   Lab Results  Component Value Date   HDL  37* 07/27/2012   Lab Results  Component Value Date   LDLCALC 66 07/27/2012   Lab Results  Component Value Date   TRIG 252* 07/27/2012   Lab Results  Component Value Date   CHOLHDL 4.1 07/27/2012     Assessment & Plan  Leukocytosis, unspecified Patient with notable dental infection, awaiting tooth extraction, grafting and post placement. Is started on Augmentin and probiotics and encouraged to continue with dentist. Due to excessive fatigue and dyspnea will also check an Echo to further r/u other sources.  Urine dip unremarkable today  ESSENTIAL HYPERTENSION, BENIGN Well controlled today no changes  DM Sugars up some despite poor po intake, encouraged increased hydration, avoid simple carbs and will adjust meds if numbers continue to climb

## 2012-10-24 NOTE — Assessment & Plan Note (Signed)
Well controlled today no changes 

## 2012-10-25 NOTE — Progress Notes (Signed)
Quick Note:  Patients spous Informed and voiced understanding.  ECHO is getting done on wed.   ______

## 2012-10-27 ENCOUNTER — Ambulatory Visit (HOSPITAL_BASED_OUTPATIENT_CLINIC_OR_DEPARTMENT_OTHER): Payer: Medicare Other

## 2012-11-03 ENCOUNTER — Ambulatory Visit (HOSPITAL_BASED_OUTPATIENT_CLINIC_OR_DEPARTMENT_OTHER)
Admission: RE | Admit: 2012-11-03 | Discharge: 2012-11-03 | Disposition: A | Discharge status: Home / Self Care | Payer: Medicare Other | Source: Ambulatory Visit | Attending: Family Medicine | Admitting: Family Medicine

## 2012-11-03 DIAGNOSIS — I2510 Atherosclerotic heart disease of native coronary artery without angina pectoris: Secondary | ICD-10-CM | POA: Insufficient documentation

## 2012-11-03 DIAGNOSIS — E785 Hyperlipidemia, unspecified: Secondary | ICD-10-CM | POA: Insufficient documentation

## 2012-11-03 DIAGNOSIS — I319 Disease of pericardium, unspecified: Secondary | ICD-10-CM | POA: Insufficient documentation

## 2012-11-03 DIAGNOSIS — I359 Nonrheumatic aortic valve disorder, unspecified: Secondary | ICD-10-CM | POA: Insufficient documentation

## 2012-11-03 DIAGNOSIS — R0609 Other forms of dyspnea: Secondary | ICD-10-CM | POA: Insufficient documentation

## 2012-11-03 DIAGNOSIS — R0600 Dyspnea, unspecified: Secondary | ICD-10-CM

## 2012-11-03 DIAGNOSIS — R0989 Other specified symptoms and signs involving the circulatory and respiratory systems: Secondary | ICD-10-CM | POA: Insufficient documentation

## 2012-11-03 DIAGNOSIS — E119 Type 2 diabetes mellitus without complications: Secondary | ICD-10-CM | POA: Insufficient documentation

## 2012-11-03 DIAGNOSIS — I498 Other specified cardiac arrhythmias: Secondary | ICD-10-CM | POA: Insufficient documentation

## 2012-11-03 NOTE — Progress Notes (Signed)
  Echocardiogram 2D Echocardiogram has been performed.  Valinda Fedie FRANCES 11/03/2012, 10:23 AM

## 2012-11-08 ENCOUNTER — Ambulatory Visit (HOSPITAL_BASED_OUTPATIENT_CLINIC_OR_DEPARTMENT_OTHER)
Admission: RE | Admit: 2012-11-08 | Discharge: 2012-11-08 | Disposition: A | Discharge status: Home / Self Care | Payer: Medicare Other | Source: Ambulatory Visit | Attending: Family Medicine | Admitting: Family Medicine

## 2012-11-08 ENCOUNTER — Encounter: Payer: Self-pay | Admitting: *Deleted

## 2012-11-08 ENCOUNTER — Encounter: Payer: Self-pay | Admitting: Family Medicine

## 2012-11-08 ENCOUNTER — Encounter (HOSPITAL_BASED_OUTPATIENT_CLINIC_OR_DEPARTMENT_OTHER): Payer: Self-pay

## 2012-11-08 ENCOUNTER — Telehealth: Payer: Self-pay | Admitting: Family Medicine

## 2012-11-08 ENCOUNTER — Encounter: Payer: Self-pay | Admitting: Cardiovascular Disease

## 2012-11-08 ENCOUNTER — Ambulatory Visit (INDEPENDENT_AMBULATORY_CARE_PROVIDER_SITE_OTHER): Payer: Medicare Other | Admitting: Family Medicine

## 2012-11-08 VITALS — BP 116/82 | HR 101 | Temp 98.1°F | Ht 61.0 in | Wt 237.1 lb

## 2012-11-08 DIAGNOSIS — R112 Nausea with vomiting, unspecified: Secondary | ICD-10-CM

## 2012-11-08 DIAGNOSIS — R911 Solitary pulmonary nodule: Secondary | ICD-10-CM

## 2012-11-08 DIAGNOSIS — R0602 Shortness of breath: Secondary | ICD-10-CM

## 2012-11-08 DIAGNOSIS — I10 Essential (primary) hypertension: Secondary | ICD-10-CM

## 2012-11-08 DIAGNOSIS — R3911 Hesitancy of micturition: Secondary | ICD-10-CM

## 2012-11-08 DIAGNOSIS — H269 Unspecified cataract: Secondary | ICD-10-CM

## 2012-11-08 DIAGNOSIS — I319 Disease of pericardium, unspecified: Secondary | ICD-10-CM

## 2012-11-08 DIAGNOSIS — D72829 Elevated white blood cell count, unspecified: Secondary | ICD-10-CM

## 2012-11-08 DIAGNOSIS — R5383 Other fatigue: Secondary | ICD-10-CM

## 2012-11-08 DIAGNOSIS — N390 Urinary tract infection, site not specified: Secondary | ICD-10-CM

## 2012-11-08 DIAGNOSIS — I313 Pericardial effusion (noninflammatory): Secondary | ICD-10-CM

## 2012-11-08 DIAGNOSIS — J8410 Pulmonary fibrosis, unspecified: Secondary | ICD-10-CM | POA: Insufficient documentation

## 2012-11-08 DIAGNOSIS — B379 Candidiasis, unspecified: Secondary | ICD-10-CM

## 2012-11-08 DIAGNOSIS — I2510 Atherosclerotic heart disease of native coronary artery without angina pectoris: Secondary | ICD-10-CM | POA: Insufficient documentation

## 2012-11-08 DIAGNOSIS — R5381 Other malaise: Secondary | ICD-10-CM

## 2012-11-08 DIAGNOSIS — I3139 Other pericardial effusion (noninflammatory): Secondary | ICD-10-CM

## 2012-11-08 HISTORY — DX: Other pericardial effusion (noninflammatory): I31.39

## 2012-11-08 HISTORY — DX: Pericardial effusion (noninflammatory): I31.3

## 2012-11-08 LAB — CBC
Hemoglobin: 12.3 g/dL (ref 12.0–15.0)
Platelets: 325 10*3/uL (ref 150–400)
RBC: 4.1 MIL/uL (ref 3.87–5.11)
WBC: 9.6 10*3/uL (ref 4.0–10.5)

## 2012-11-08 MED ORDER — FLUCONAZOLE 150 MG PO TABS: ORAL_TABLET | ORAL | Status: DC

## 2012-11-08 MED ORDER — ISOSORBIDE MONONITRATE ER 60 MG PO TB24: 60.0000 mg | ORAL_TABLET | Freq: Every day | ORAL | Status: DC

## 2012-11-08 MED ORDER — IOHEXOL 300 MG/ML  SOLN
80.0000 mL | Freq: Once | INTRAMUSCULAR | Status: AC | PRN
  Administered 2012-11-08: 80 mL via INTRAVENOUS

## 2012-11-08 NOTE — Assessment & Plan Note (Signed)
Present for some time but slightly worse and patient c/o worsening severe fatigue, is referred to her cardiologist for further consideration.

## 2012-11-08 NOTE — Assessment & Plan Note (Signed)
Well controlled no changes 

## 2012-11-08 NOTE — Assessment & Plan Note (Signed)
-  repeat cbc today

## 2012-11-08 NOTE — Patient Instructions (Signed)
  Pericardial Effusion Pericardial effusion is an accumulation of extra fluid in the pericardial space. This is the space between the heart and the sac that surrounds it. This space normally contains a small amount of fluid which serves as lubrication for the heart inside the sac. If the amount of fluid increases, it causes problems for the working of the heart. This is called a heart (cardiac) tamponade.  CAUSES  A higher incidence of pericardial effusion is associated with certain diseases. Some common causes of pericardial effusion are:  Infections (bacterial, viral, fungal, from AIDS, etc.).  Cancer which has spread to the pericardial sac.  Fluid accumulation in the sac after a heart attack or open heart surgery.  Injury (a fall with chest injury or as a result of a knife or gunshot wound) with bleeding into the pericardial sac.  Immune diseases (such as Rheumatoid Arthritis) and other arthritis conditions.  Reactions (uncommon) to medications. SYMPTOMS  Very small effusions may cause no problems. Even a small effusion if it comes on rapidly can be deadly. Some common symptoms are:  Chest pain, pressure, discomfort.  Light-headed feeling, fainting.  Cough, shortness of breath.  Feeling of palpitations.  Hiccoughs  Anxiety or confusion DIAGNOSIS  Your caregiver may do a number of blood tests and imaging tests (like X-rays) to help find the cause.   ECHO (Echocardiographic stress test) has become the diagnostic method of choice. This is due to its portability and availability. CT and MRI are also used, and may be more accurate.  Pericardioscopy, where available, uses a telescope-like instrument to look inside the pericardial sac.  This may be helpful in cases of unexplained pericardial effusions. It allows your caregiver to look at the pericardium and do pericardial biopsies if something abnormal is found.  Sometimes fluid may be removed to help with diagnosing the cause of  the accumulation. This is called a pericardiocentesis. TREATMENT  Treatment is directed at removal of the extra pericardial fluid and treating the cause. Small amounts of effusion that are not causing problems can simply be watched. If the fluid is accumulating rapidly and resulting in cardiac tamponade, this can be life-threatening. Emergency removal of fluid must be done.  SEEK IMMEDIATE MEDICAL CARE IF:   You develop chest pain, irregular heart beat (palpitations) or racing heart, shortness of breath, or begin sweating.  You become lightheaded or pass out.  You develop increasing swelling of the legs. Document Released: 12/17/2004 Document Revised: 07/14/2011 Document Reviewed: 12/03/2007 [facility] Patient Information 2014 [place], [place].

## 2012-11-08 NOTE — Assessment & Plan Note (Signed)
Is supposed to have extraction of left side in 3 weeks

## 2012-11-08 NOTE — Progress Notes (Signed)
Patient ID: Amber [name], female   DOB: 05/20/1945, 67 y.o.   MRN: [id number] Amber [name] [id number] 1946/03/15 11/08/2012      Progress Note-Follow Up  Subjective  Chief Complaint  Chief Complaint  Patient presents with  . Follow-up    3 week- pt can't pee X started last night and this morning    HPI  Patient is a 67 year old Caucasian female in today with her husband complaining of persistent fatigue. Her husband she reports she sleeps many hours a day. She struggles with decompression as well. 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 Difficulty initiating her urine. No dysuria or hematuria. No recent fevers or chills but she has persistent myalgias and malaise. No chest pain palpitations. Has persistent shortness of breath but nothing worsening. Complains of daily nausea and intermittent episodes of vomiting.   Past Medical History  Diagnosis Date  . Thyroid disease   . Hyperlipidemia   . Diabetes mellitus     type 2  . Hypoxia 10/07/2010  . Skin lesion of back 10/18/2010  . Viral infection characterized by skin and mucous membrane lesions 10/18/2010  . Vitamin D deficiency 11/21/2010  . Back pain 11/21/2010  . Motion sickness 12/04/2010  . UTI (lower urinary tract infection) 09/08/2011  . Hot flashes 10/06/2011  . Bronchitis 02/28/2012  . Abdominal  pain, other specified site 05/05/2012  . Tubular adenoma of colon 2011  . Hypothyroidism   . Depression   . Fibromyalgia   . Sleep apnea 06/01/06    sleep study [facility]- 279 arousals with index of 52.0/hr for total sleep time.Marland Kitchen AHI 51.99/hr during total sleep. 72.00/hr during REM sleep highest heart rate awake was 92 beats/ min lowest awake was 72. Highest heartrate while asleep was 92 and the lowest was 61.  Marland Kitchen GERD (gastroesophageal reflux disease)   . Myocardial infarction   . Heart murmur     MVP  . Arthritis   . Shortness of breath   . Renal lithiasis 08/28/2012  . Nausea alone 08/28/2012   . Leukocytosis, unspecified 10/24/2012  . Hypertension 11/13/10    ECHO- EF >55%-Left Ventricular systolic normal.Mild mitral regurgitation. Mild tricupsid regurgitation. There is a small posterior pericardial perfusion. No hemodynamic compromise.   . Coronary artery disease 11/13/10    Nuclear stress test-normal low risk scan. EF 63%.  . Pericardial effusion 11/08/2012  . Cataract 10/08/2010    Past Surgical History  Procedure Laterality Date  . Angioplasty  1980  . Angioplasty  2006    3 stents  . Angioplasty  2009    2 stents  . Cholecystectomy    . Thyroidectomy  1950    for goiter  . Diverticulitis  1978    exploratory  . Right rotator cuff repair  2004  . Left rotator cuff repair  2004  . Bone fusion left foot  2010    7 screws in foot, bunionectomy  . Hip surgery      large bone spur removed, right  . Back surgery  2004    lumbar discectomy for bulging disc  . Abdominal hysterectomy  1977    partial  . Colonoscopy N/A 07/26/2012    Procedure: COLONOSCOPY;  Surgeon: Hart Carwin, MD;  Location: [facility];  Service: Endoscopy;  Laterality: N/A;    Family History  Problem Relation Age of Onset  . Heart disease Father   . Diabetes Sister   . Hypertension Sister   . Depression Sister   .  Cancer Sister     colon cancer s/p colectomy  . Emphysema Brother     smoker  . Diabetes Daughter   . Hypertension Daughter   . Depression Daughter   . Diverticulitis Daughter   . Other Daughter     Trichotrilomania  . Aneurysm Maternal Grandmother     brain aneurysm  . Diabetes Paternal Grandmother   . Hypertension Paternal Grandmother   . Cancer Paternal Grandmother     colon  . Crohn's disease Paternal Grandfather   . Emphysema Brother     smoker  . Heart disease Brother   . Diabetes Brother   . Cancer Brother     thyroid  . Coronary artery disease Brother     s/p MI's and 7 stents  . Diabetes Brother   . Other Brother     back disease w/ bulging discs  .  Colon polyps Brother     esophageal and tongue polyps  . Diabetes Sister   . Other Sister     short term memory loss  . Aneurysm Sister     brain aneursm s/p coiling  . Diabetes Son   . Anxiety disorder Son     History   Social History  . Marital Status: Married    Spouse Name: N/A    Number of Children: N/A  . Years of Education: N/A   Occupational History  . retired Geologist, engineering    Social History Main Topics  . Smoking status: Never Smoker   . Smokeless tobacco: Never Used  . Alcohol Use: No  . Drug Use: No  . Sexually Active: Yes -- Female partner(s)   Other Topics Concern  . Not on file   Social History Narrative  . No narrative on file    Current Outpatient Prescriptions on File Prior to Visit  Medication Sig Dispense Refill  . albuterol (PROVENTIL HFA;VENTOLIN HFA) 108 (90 BASE) MCG/ACT inhaler Inhale 2 puffs into the lungs every 2 (two) hours as needed for wheezing or shortness of breath (cough).  1 Inhaler  0  . allopurinol (ZYLOPRIM) 100 MG tablet Take 1 tablet (100 mg total) by mouth daily.  30 tablet  5  . amLODipine-benazepril (LOTREL) 5-20 MG per capsule Take 1 capsule by mouth daily before breakfast.  30 capsule  5  . amoxicillin-clavulanate (AUGMENTIN) 875-125 MG per tablet Take 1 tablet by mouth 2 (two) times daily.  20 tablet  0  . aspirin 81 MG tablet Take 81 mg by mouth 2 (two) times daily.       . Cholecalciferol (VITAMIN D) 2000 UNITS CAPS Take 1 capsule by mouth daily.      . clopidogrel (PLAVIX) 75 MG tablet Take 75 mg by mouth daily before breakfast.      . dexlansoprazole (DEXILANT) 60 MG capsule Take 1 capsule (60 mg total) by mouth daily. Failed Protonix, Nexium  30 capsule  5  . ezetimibe-simvastatin (VYTORIN) 10-40 MG per tablet Take 1 tablet by mouth at bedtime.  30 tablet  5  . gabapentin (NEURONTIN) 300 MG capsule Take 2 capsules (600 mg total) by mouth 2 (two) times daily.  60 capsule  5  . levothyroxine (SYNTHROID, LEVOTHROID) 25  MCG tablet Take 25 mcg by mouth at bedtime.      . Liraglutide (VICTOZA) 18 MG/3ML SOLN injection Inject 1.8 mg into the skin daily.      . metformin (FORTAMET) 1000 MG (OSM) 24 hr tablet TAKE 2 TABLETS BY MOUTH EVERY DAY  WITH BREAKFAST  60 tablet  2  . Milnacipran (SAVELLA) 50 MG TABS Take 1 tablet (50 mg total) by mouth 2 (two) times daily.  60 tablet  5  . nitrofurantoin (MACRODANTIN) 50 MG capsule Take 1 capsule (50 mg total) by mouth at bedtime.  30 capsule  3  . ondansetron (ZOFRAN-ODT) 8 MG disintegrating tablet Take 1 tablet (8 mg total) by mouth every 8 (eight) hours as needed for nausea.  40 tablet  1  . Probiotic Product (ALIGN) 4 MG CAPS Take 1 capsule by mouth daily.      . promethazine (PHENERGAN) 25 MG tablet Take 25 mg by mouth every 6 (six) hours as needed for nausea.      . ranitidine (ZANTAC) 300 MG tablet Take 1 tablet (300 mg total) by mouth at bedtime.  30 tablet  5  . traMADol (ULTRAM) 50 MG tablet Take 1 tablet (50 mg total) by mouth 2 (two) times daily.  120 tablet  1   No current facility-administered medications on file prior to visit.    Allergies  Allergen Reactions  . Percocet (Oxycodone-Acetaminophen) Itching    Review of Systems  Review of Systems  Constitutional: Positive for malaise/fatigue. Negative for fever.  HENT: Negative for congestion.   Eyes: Negative for pain and discharge.  Respiratory: Positive for shortness of breath.   Cardiovascular: Negative for chest pain, palpitations and leg swelling.  Gastrointestinal: Positive for nausea and vomiting. Negative for heartburn, abdominal pain, diarrhea, constipation, blood in stool and melena.  Genitourinary: Positive for urgency. Negative for dysuria and hematuria.  Musculoskeletal: Positive for myalgias, back pain and joint pain. Negative for falls.  Skin: Negative for rash.  Neurological: Negative for loss of consciousness and headaches.  Endo/Heme/Allergies: Negative for polydipsia.   Psychiatric/Behavioral: Negative for depression and suicidal ideas. The patient is not nervous/anxious and does not have insomnia.     Objective  BP 116/82  Pulse 101  Temp(Src) 98.1 F (36.7 C) (Oral)  Ht 5\' 1"  (1.549 m)  Wt 237 lb 1.1 oz (107.534 kg)  BMI 44.82 kg/m2  SpO2 90%  Physical Exam  Physical Exam  Constitutional: She is oriented to person, place, and time and well-developed, well-nourished, and in no distress. No distress.  HENT:  Head: Normocephalic and atraumatic.  Eyes: Conjunctivae are normal.  Neck: Neck supple. No thyromegaly present.  Cardiovascular: Normal rate, regular rhythm and normal heart sounds.   No murmur heard. Pulmonary/Chest: Effort normal and breath sounds normal. She has no wheezes. She exhibits no tenderness.  Abdominal: Soft. Bowel sounds are normal. She exhibits no distension and no mass.  Musculoskeletal: She exhibits no edema.  Lymphadenopathy:    She has no cervical adenopathy.  Neurological: She is alert and oriented to person, place, and time.  Skin: Skin is warm and dry. No rash noted. She is not diaphoretic.  Psychiatric: Memory, affect and judgment normal.    Lab Results  Component Value Date   TSH 2.99 08/27/2012   Lab Results  Component Value Date   WBC 9.6 11/08/2012   HGB 12.3 11/08/2012   HCT 36.6 11/08/2012   MCV 89.3 11/08/2012   PLT 325 11/08/2012   Lab Results  Component Value Date   CREATININE 0.80 10/22/2012   BUN 17 10/22/2012   NA 140 10/22/2012   K 4.2 10/22/2012   CL 102 10/22/2012   CO2 28 10/22/2012   Lab Results  Component Value Date   ALT 18 08/27/2012   AST 20  08/27/2012   ALKPHOS 78 08/27/2012   BILITOT 0.3 08/27/2012   Lab Results  Component Value Date   CHOL 153 07/27/2012   Lab Results  Component Value Date   HDL 37* 07/27/2012   Lab Results  Component Value Date   LDLCALC 66 07/27/2012   Lab Results  Component Value Date   TRIG 252* 07/27/2012   Lab Results  Component Value Date   CHOLHDL 4.1  07/27/2012     Assessment & Plan  Pericardial effusion Present for some time but slightly worse and patient c/o worsening severe fatigue, is referred to her cardiologist for further consideration.   Leukocytosis, unspecified repeat cbc today  UTI (lower urinary tract infection) Check a urinalysis today. Patient struggling with urinary hesitancy the past few days.   ESSENTIAL HYPERTENSION, BENIGN Well controlled no changes  Cataract Is supposed to have extraction of left side in 3 weeks  Nausea with vomiting Daily nausea and intermittent episodes of vomiting, will refer back to gastroenterology for further consideration

## 2012-11-08 NOTE — Assessment & Plan Note (Signed)
Check a urinalysis today. Patient struggling with urinary hesitancy the past few days.

## 2012-11-08 NOTE — Telephone Encounter (Signed)
Refill- isosorbide mn er 60mg  tablet. Take one tablet every day. Qty 90 last fill 6.7.14

## 2012-11-08 NOTE — Assessment & Plan Note (Signed)
Daily nausea and intermittent episodes of vomiting, will refer back to gastroenterology for further consideration

## 2012-11-09 ENCOUNTER — Encounter: Payer: Self-pay | Admitting: *Deleted

## 2012-11-09 ENCOUNTER — Ambulatory Visit (INDEPENDENT_AMBULATORY_CARE_PROVIDER_SITE_OTHER): Payer: Medicare Other | Admitting: Cardiovascular Disease

## 2012-11-09 VITALS — BP 100/70 | HR 92 | Ht 61.0 in | Wt 214.1 lb

## 2012-11-09 DIAGNOSIS — R0600 Dyspnea, unspecified: Secondary | ICD-10-CM

## 2012-11-09 DIAGNOSIS — R0989 Other specified symptoms and signs involving the circulatory and respiratory systems: Secondary | ICD-10-CM

## 2012-11-09 DIAGNOSIS — G4730 Sleep apnea, unspecified: Secondary | ICD-10-CM

## 2012-11-09 DIAGNOSIS — D649 Anemia, unspecified: Secondary | ICD-10-CM

## 2012-11-09 DIAGNOSIS — I2510 Atherosclerotic heart disease of native coronary artery without angina pectoris: Secondary | ICD-10-CM

## 2012-11-09 DIAGNOSIS — E119 Type 2 diabetes mellitus without complications: Secondary | ICD-10-CM

## 2012-11-09 LAB — TSH: TSH: 4.236 u[IU]/mL (ref 0.350–4.500)

## 2012-11-09 LAB — URINALYSIS
Ketones, ur: NEGATIVE mg/dL
Specific Gravity, Urine: >1.03 — ABNORMAL HIGH (ref 1.005–1.030)
pH: 5 (ref 5.0–8.0)

## 2012-11-09 LAB — ANA: Anti Nuclear Antibody(ANA): NEGATIVE

## 2012-11-09 MED ORDER — LOSARTAN POTASSIUM 25 MG PO TABS: 25.0000 mg | ORAL_TABLET | Freq: Every day | ORAL | Status: DC

## 2012-11-09 MED ORDER — CIPROFLOXACIN HCL 250 MG PO TABS: 250.0000 mg | ORAL_TABLET | Freq: Two times a day (BID) | ORAL | Status: DC

## 2012-11-09 NOTE — Addendum Note (Signed)
Addended by: Court Joy on: 11/09/2012 04:51 PM   Modules accepted: Orders

## 2012-11-09 NOTE — Patient Instructions (Signed)
Your physician recommends that you schedule a follow-up appointment in: 4 WEEKS.  Your physician has requested that you have a lexiscan myoview. For further information please visit [email or web address] Please follow instruction sheet, as given. This will be scheduled in the next 2 weeks.  Your physician has recommended you make the following change in your medication: STOP taking the Lotrel. Start Losartan 25 mg. This has already been sent to your pharmacy.

## 2012-11-10 LAB — URINE CULTURE

## 2012-11-13 ENCOUNTER — Encounter: Payer: Self-pay | Admitting: Cardiovascular Disease

## 2012-11-13 NOTE — Progress Notes (Signed)
Patient ID: Amber [name], female   DOB: 1946-04-11, 67 y.o.   MRN: [id number]     HPI: Amber [name], is a 67 y.o. female who presents to the office today for 4 month cardiology follow up evaluation.  As far sock she is a very pleasant 67 year old female with history of morbid obesity, and significant coronary obstructive disease. She has undergone remote interventions to LAD, circumflex, as well as right coronary artery. Her last intervention was done in April 2009 with a new stent was placed proximal to her previously placed LAD stent and now extended into the the ostial region. Additional problems also include hypertension, obstructive sleep apnea on CPAP therapy, morbid obesity, type 2 diabetes mellitus, GERD, hyperlipidemia, as well as a small chronic pericardial effusion. She's continued to have problems with recurrent urinary tract infections and has seen Dr. Laverle Patter for this as well as kidney stones he recently completed a course of antibiotics he was recently seen by her primary care echo Doppler study was done on 11/03/2012 at [facility] which revealed an ejection fraction in the 55-65% range. She did not have focal wall motion abnormalities. Mildly calcified anulus of her aortic valve. There was no aortic stenosis. There was noted a prominent pericardial fat pad but there also was evidence for a partially loculated pericardial effusion posterior to the heart. She did not have any findings of cancer not. Apparently she was referred by her primary physician for a CT scan which again showed the chronic pericardial effusion. She did have coronary artery disease with stents noted. There is also stable granulomatous changes in the mediastinum and right lower lobe. Amber [name]  does complain of some weakness of the she does note shortness of breath. Her last nuclear perfusion study was 2 years ago which did not reveal ischemia.  Past Medical History  Diagnosis Date  . Thyroid  disease   . Hyperlipidemia   . Diabetes mellitus     type 2  . Hypoxia 10/07/2010  . Skin lesion of back 10/18/2010  . Viral infection characterized by skin and mucous membrane lesions 10/18/2010  . Vitamin D deficiency 11/21/2010  . Back pain 11/21/2010  . Motion sickness 12/04/2010  . UTI (lower urinary tract infection) 09/08/2011  . Hot flashes 10/06/2011  . Bronchitis 02/28/2012  . Abdominal  pain, other specified site 05/05/2012  . Tubular adenoma of colon 2011, 2014  . Hypothyroidism   . Depression   . Fibromyalgia   . Sleep apnea 06/01/06    sleep study [facility]- 279 arousals with index of 52.0/hr for total sleep time.Marland Kitchen AHI 51.99/hr during total sleep. 72.00/hr during REM sleep highest heart rate awake was 92 beats/ min lowest awake was 72. Highest heartrate while asleep was 92 and the lowest was 61.  Marland Kitchen GERD (gastroesophageal reflux disease)   . Myocardial infarction   . Heart murmur     MVP  . Arthritis   . Shortness of breath   . Renal lithiasis 08/28/2012  . Leukocytosis, unspecified 10/24/2012  . Hypertension 11/13/10    ECHO- EF >55%-Left Ventricular systolic normal.Mild mitral regurgitation. Mild tricupsid regurgitation. There is a small posterior pericardial perfusion. No hemodynamic compromise.   . Coronary artery disease 11/13/10    Nuclear stress test-normal low risk scan. EF 63%.  . Pericardial effusion 11/08/2012  . Cataract 10/08/2010  . Diverticulitis     Past Surgical History  Procedure Laterality Date  . Angioplasty  1980  . Angioplasty  2006    3 stents  . Angioplasty  2009    2 stents  . Cholecystectomy    . Thyroidectomy  1950    for goiter  . Diverticulitis  1978    exploratory  . Right rotator cuff repair  2004  . Left rotator cuff repair  2004  . Bone fusion left foot  2010    7 screws in foot, bunionectomy  . Hip surgery      large bone spur removed, right  . Back surgery  2004    lumbar discectomy for bulging disc  . Abdominal  hysterectomy  1977    partial  . Colonoscopy N/A 07/26/2012    Procedure: COLONOSCOPY;  Surgeon: Hart Carwin, MD;  Location: [facility];  Service: Endoscopy;  Laterality: N/A;    Allergies  Allergen Reactions  . Percocet (Oxycodone-Acetaminophen) Itching    Current Outpatient Prescriptions  Medication Sig Dispense Refill  . albuterol (PROVENTIL HFA;VENTOLIN HFA) 108 (90 BASE) MCG/ACT inhaler Inhale 2 puffs into the lungs every 2 (two) hours as needed for wheezing or shortness of breath (cough).  1 Inhaler  0  . gabapentin (NEURONTIN) 300 MG capsule Take 2 capsules (600 mg total) by mouth 2 (two) times daily.  60 capsule  5  . ILEVRO 0.3 % SUSP       . isosorbide mononitrate (IMDUR) 60 MG 24 hr tablet Take 1 tablet (60 mg total) by mouth daily before breakfast.  30 tablet  5  . levothyroxine (SYNTHROID, LEVOTHROID) 25 MCG tablet Take 25 mcg by mouth at bedtime.      . Liraglutide (VICTOZA) 18 MG/3ML SOLN injection Inject 1.8 mg into the skin daily.      . metformin (FORTAMET) 1000 MG (OSM) 24 hr tablet TAKE 2 TABLETS BY MOUTH EVERY DAY WITH BREAKFAST  60 tablet  2  . Milnacipran (SAVELLA) 50 MG TABS Take 1 tablet (50 mg total) by mouth 2 (two) times daily.  60 tablet  5  . nitrofurantoin (MACRODANTIN) 50 MG capsule Take 1 capsule (50 mg total) by mouth at bedtime.  30 capsule  3  . ondansetron (ZOFRAN-ODT) 8 MG disintegrating tablet Take 1 tablet (8 mg total) by mouth every 8 (eight) hours as needed for nausea.  40 tablet  1  . ONE TOUCH ULTRA TEST test strip       . pantoprazole (PROTONIX) 40 MG tablet       . PREMARIN 0.3 MG tablet       . Probiotic Product (ALIGN) 4 MG CAPS Take 1 capsule by mouth daily.      . promethazine (PHENERGAN) 25 MG tablet Take 25 mg by mouth every 6 (six) hours as needed for nausea.      . ranitidine (ZANTAC) 300 MG tablet Take 1 tablet (300 mg total) by mouth at bedtime.  30 tablet  5  . traMADol (ULTRAM) 50 MG tablet Take 1 tablet (50 mg total) by mouth  2 (two) times daily.  120 tablet  1  . allopurinol (ZYLOPRIM) 100 MG tablet Take 1 tablet (100 mg total) by mouth daily.  30 tablet  5  . amoxicillin-clavulanate (AUGMENTIN) 875-125 MG per tablet Take 1 tablet by mouth 2 (two) times daily.  20 tablet  0  . aspirin 81 MG tablet Take 81 mg by mouth 2 (two) times daily.       . carvedilol (COREG) 6.25 MG tablet Take 6.25 mg by mouth 2 (two) times daily with a meal.  Take 1 & 1/2 tablet twice daily      . chlorhexidine (PERIDEX) 0.12 % solution       . Cholecalciferol (VITAMIN D) 2000 UNITS CAPS Take 1 capsule by mouth daily.      . ciprofloxacin (CIPRO) 250 MG tablet Take 1 tablet (250 mg total) by mouth 2 (two) times daily. X 7 days  14 tablet  0  . clopidogrel (PLAVIX) 75 MG tablet Take 75 mg by mouth daily before breakfast.      . dexlansoprazole (DEXILANT) 60 MG capsule Take 1 capsule (60 mg total) by mouth daily. Failed Protonix, Nexium  30 capsule  5  . ezetimibe-simvastatin (VYTORIN) 10-40 MG per tablet Take 1 tablet by mouth at bedtime.  30 tablet  5  . fluconazole (DIFLUCAN) 150 MG tablet 1 tab po daily x 3 days then 1 tab po weekly x 3 weeks  6 tablet  0  . losartan (COZAAR) 25 MG tablet Take 1 tablet (25 mg total) by mouth daily.  30 tablet  6   No current facility-administered medications for this visit.    Socially she is married has 3 children 5 grandchildren. She is unable to exercise. She denies tobacco alcohol use.  ROS is negative for fevers, chills or night sweats. She recently completed therapy for an abscessed tooth the She denies presyncope or syncope. He does admit to some mild shortness of breath and easy fatigability. She has had issues with chronic urinary tract infections. She does note shortness of breath the she denies pleuritic chest pain. She denies blood in her stool or urine. She does note some occasional leg swelling intermittently. She denies tremors.   Other system review is negative.  PE BP 100/70  Pulse 92   Ht 5\' 1"  (1.549 m)  Wt 214 lb 1.6 oz (97.115 kg)  BMI 40.47 kg/m2  Repeat BP my me 96/70. General: Alert, oriented, no distress.  Skin: normal turgor, no rashes HEENT: Normocephalic, atraumatic. Pupils round and reactive; sclera anicteric;no lid lag.  Nose without nasal septal hypertrophy Mouth/Parynx benign; Mallinpatti scale 3 Neck: No JVD, no carotid briuts Lungs: clear to ausculatation and percussion; no wheezing or rales Heart: RRR, s1 s2 normal 1/6 systolic murmur at aortic region. There is no evidence for any pericardial friction rub. Abdomen: Morbidly obese; soft, nontender; no hepatosplenomehaly, BS+; abdominal aorta nontender and not dilated by palpation. Pulses 2+ Extremities: no clubbing cyanosis or edema, Homan's sign negative  Neurologic: grossly nonfocal  ECG: Sinus rhythm 92 beats per minute. PR interval 148 ms QTc interval 460 ms.  LABS:  BMET    Component Value Date/Time   NA 140 10/22/2012 1552   K 4.2 10/22/2012 1552   CL 102 10/22/2012 1552   CO2 28 10/22/2012 1552   GLUCOSE 113* 10/22/2012 1552   BUN 17 10/22/2012 1552   CREATININE 0.80 10/22/2012 1552   CREATININE 0.7 08/27/2012 1523   CALCIUM 9.8 10/22/2012 1552   GFRNONAA >60 09/25/2009 1010   GFRAA  Value: >60        The eGFR has been calculated using the MDRD equation. This calculation has not been validated in all clinical situations. eGFR's persistently <60 mL/min signify possible Chronic Kidney Disease. 09/25/2009 1010     Hepatic Function Panel     Component Value Date/Time   PROT 6.2 08/27/2012 1523   ALBUMIN 4.3 10/22/2012 1552   AST 20 08/27/2012 1523   ALT 18 08/27/2012 1523   ALKPHOS 78 08/27/2012 1523  BILITOT 0.3 08/27/2012 1523   BILIDIR 0.1 08/27/2012 1523   IBILI 0.3 07/27/2012 1007     CBC    Component Value Date/Time   WBC 9.6 11/08/2012 0921   RBC 4.10 11/08/2012 0921   HGB 12.3 11/08/2012 0921   HCT 36.6 11/08/2012 0921   PLT 325 11/08/2012 0921   MCV 89.3 11/08/2012 0921   MCH 30.0 11/08/2012  0921   MCHC 33.6 11/08/2012 0921   RDW 16.0* 11/08/2012 0921   LYMPHSABS 2.9 12/27/2010 1526   MONOABS 0.8 12/27/2010 1526   EOSABS 0.2 12/27/2010 1526   BASOSABS 0.0 12/27/2010 1526     BNP    Component Value Date/Time   PROBNP <30.0 04/23/2007 1740    Lipid Panel     Component Value Date/Time   CHOL 153 07/27/2012 1007   TRIG 252* 07/27/2012 1007   HDL 37* 07/27/2012 1007   CHOLHDL 4.1 07/27/2012 1007   VLDL 50* 07/27/2012 1007   LDLCALC 66 07/27/2012 1007     RADIOLOGY: Ct Chest W Contrast  11/08/2012   *RADIOLOGY REPORT*  Clinical Data: Shortness of breath.  CT.  Pericardial effusion. Pulmonary nodule.  CT CHEST WITH CONTRAST  Technique:  Multidetector CT imaging of the chest was performed following the standard protocol during bolus administration of intravenous contrast.  Contrast: 80mL OMNIPAQUE IOHEXOL 300 MG/ML  SOLN  Comparison: Two-view chest 06/14/2012.  CTA chest 01/03/2009.  Findings: The heart size is within normal limits.  The coronary stents are in place.  Atherosclerotic calcification is present within the coronary arteries.  The chronic pericardial effusion has increased since the prior CT head.  No significant mediastinal or axillary adenopathy is present. Calcified nodes at the azygo- esophageal recess are stable.  The thoracic inlet is within normal limits.  Limited imaging of the upper abdomen is unremarkable.  A calcified granuloma at the right lung base is stable. A 3 mm nodule is evident along the left major fissure.  The bone windows demonstrate fused anterior osteophytes across multiple levels.  No focal lytic or blastic lesions are evident. The ribs are unremarkable.  IMPRESSION:  1.  Slight interval increase and a chronic pericardial effusion. 2.  Coronary artery disease with stents in place. 3.  Stable granulomatous changes in the mediastinum and right lower lobe.   Original Report Authenticated By: Marin Roberts, M.D.      ASSESSMENT AND PLAN: Amber [name]  has established multivessel disease and in 1980 underwent initial intervention while in [place]. In November 2006 underwent staged intervention to RCA, circumflex and LAD. Her last intervention was in April 2009 which a new 30x23 mm Cypher stent was inserted proximal to the previously placed stent.  She has been demonstrated to have small pericardial effusion without tamponade hemodynamics. She does complain of shortness of breath. Her blood pressure is somewhat low. I recommend she discontinue her Lotrel which has been at 5 mg amlodipine 20 mg benazepril and in its place I will just initiate low-dose ARB therapy with losartan 25 mg daily. She is now 5 years since her last intervention. I am scheduling her for followup pharmacologic Myoview study as potential ischemia. Posterior back in the office in 4 weeks for followup evaluation and further recommendations will be made at that time.     Lennette Bihari, MD, [facility]  11/13/2012 2:16 PM

## 2012-11-16 ENCOUNTER — Encounter: Payer: Self-pay | Admitting: Family Medicine

## 2012-11-16 ENCOUNTER — Ambulatory Visit: Payer: Medicare Other | Admitting: Cardiovascular Disease

## 2012-11-16 ENCOUNTER — Ambulatory Visit (INDEPENDENT_AMBULATORY_CARE_PROVIDER_SITE_OTHER): Payer: Medicare Other | Admitting: Family Medicine

## 2012-11-16 VITALS — BP 114/80 | HR 104 | Temp 97.4°F | Ht 61.0 in | Wt 241.0 lb

## 2012-11-16 DIAGNOSIS — N390 Urinary tract infection, site not specified: Secondary | ICD-10-CM

## 2012-11-16 DIAGNOSIS — R112 Nausea with vomiting, unspecified: Secondary | ICD-10-CM

## 2012-11-16 DIAGNOSIS — I319 Disease of pericardium, unspecified: Secondary | ICD-10-CM

## 2012-11-16 DIAGNOSIS — I10 Essential (primary) hypertension: Secondary | ICD-10-CM

## 2012-11-16 DIAGNOSIS — I313 Pericardial effusion (noninflammatory): Secondary | ICD-10-CM

## 2012-11-16 NOTE — Progress Notes (Signed)
Patient ID: Amber [name], female   DOB: Oct 05, 1945, 67 y.o.   MRN: [id number] Amber [name] [id number] 17-Oct-1945 11/16/2012      Progress Note-Follow Up  Subjective  Chief Complaint  Chief Complaint  Patient presents with  . Follow-up    1 week    HPI  Patient is a 67 year old Caucasian female in today accompanied by her husband. She is feeling somewhat better since her last visit. At her last visit they were struggling with significant malaise and myalgias and she was found to have a UTI. This is being treated. Her nausea and vomiting also improved since treatment. She has some nausea but no persistent vomiting. Following closely with cardiology in her pericardial effusion is thought to be stable. We are proceeding with further cardiac testing this week as per caution. They've adjusted her blood pressure medications and she does feel her weakness is slightly improved. Denies any fevers hot flashes appear to be improving. No headache, chest pain, palpitations or worsening shortness of breath or noted.  Past Medical History  Diagnosis Date  . Thyroid disease   . Hyperlipidemia   . Diabetes mellitus     type 2  . Hypoxia 10/07/2010  . Skin lesion of back 10/18/2010  . Viral infection characterized by skin and mucous membrane lesions 10/18/2010  . Vitamin D deficiency 11/21/2010  . Back pain 11/21/2010  . Motion sickness 12/04/2010  . UTI (lower urinary tract infection) 09/08/2011  . Hot flashes 10/06/2011  . Bronchitis 02/28/2012  . Abdominal  pain, other specified site 05/05/2012  . Tubular adenoma of colon 2011, 2014  . Hypothyroidism   . Depression   . Fibromyalgia   . Sleep apnea 06/01/06    sleep study [facility]- 279 arousals with index of 52.0/hr for total sleep time.Marland Kitchen AHI 51.99/hr during total sleep. 72.00/hr during REM sleep highest heart rate awake was 92 beats/ min lowest awake was 72. Highest heartrate while asleep was 92 and the lowest was 61.  Marland Kitchen GERD  (gastroesophageal reflux disease)   . Myocardial infarction   . Heart murmur     MVP  . Arthritis   . Shortness of breath   . Renal lithiasis 08/28/2012  . Leukocytosis, unspecified 10/24/2012  . Hypertension 11/13/10    ECHO- EF >55%-Left Ventricular systolic normal.Mild mitral regurgitation. Mild tricupsid regurgitation. There is a small posterior pericardial perfusion. No hemodynamic compromise.   . Coronary artery disease 11/13/10    Nuclear stress test-normal low risk scan. EF 63%.  . Pericardial effusion 11/08/2012  . Cataract 10/08/2010  . Diverticulitis     Past Surgical History  Procedure Laterality Date  . Angioplasty  1980  . Angioplasty  2006    3 stents  . Angioplasty  2009    2 stents  . Cholecystectomy    . Thyroidectomy  1950    for goiter  . Diverticulitis  1978    exploratory  . Right rotator cuff repair  2004  . Left rotator cuff repair  2004  . Bone fusion left foot  2010    7 screws in foot, bunionectomy  . Hip surgery      large bone spur removed, right  . Back surgery  2004    lumbar discectomy for bulging disc  . Abdominal hysterectomy  1977    partial  . Colonoscopy N/A 07/26/2012    Procedure: COLONOSCOPY;  Surgeon: Hart Carwin, MD;  Location: [facility];  Service: Endoscopy;  Laterality: N/A;  Family History  Problem Relation Age of Onset  . Heart disease Father   . Diabetes Sister   . Hypertension Sister   . Depression Sister   . Colon cancer Sister     s/p colectomy  . Emphysema Brother     smoker  . Diabetes Daughter   . Hypertension Daughter   . Depression Daughter   . Diverticulitis Daughter   . Other Daughter     Trichotrilomania  . Aneurysm Maternal Grandmother     brain aneurysm  . Diabetes Paternal Grandmother   . Hypertension Paternal Grandmother   . Colon cancer Paternal Grandmother   . Crohn's disease Paternal Grandfather   . Emphysema Brother     smoker  . Heart disease Brother   . Diabetes Brother   . Thyroid  cancer Brother   . Coronary artery disease Brother     s/p MI's and 7 stents  . Diabetes Brother   . Other Brother     back disease w/ bulging discs  . Colon polyps Brother     esophageal and tongue polyps  . Diabetes Sister   . Other Sister     short term memory loss  . Aneurysm Sister     brain aneursm s/p coiling  . Diabetes Son   . Anxiety disorder Son     History   Social History  . Marital Status: Married    Spouse Name: N/A    Number of Children: N/A  . Years of Education: N/A   Occupational History  . retired Geologist, engineering    Social History Main Topics  . Smoking status: Never Smoker   . Smokeless tobacco: Never Used  . Alcohol Use: No  . Drug Use: No  . Sexually Active: Yes -- Female partner(s)   Other Topics Concern  . Not on file   Social History Narrative  . No narrative on file    Current Outpatient Prescriptions on File Prior to Visit  Medication Sig Dispense Refill  . albuterol (PROVENTIL HFA;VENTOLIN HFA) 108 (90 BASE) MCG/ACT inhaler Inhale 2 puffs into the lungs every 2 (two) hours as needed for wheezing or shortness of breath (cough).  1 Inhaler  0  . allopurinol (ZYLOPRIM) 100 MG tablet Take 1 tablet (100 mg total) by mouth daily.  30 tablet  5  . amoxicillin-clavulanate (AUGMENTIN) 875-125 MG per tablet Take 1 tablet by mouth 2 (two) times daily.  20 tablet  0  . aspirin 81 MG tablet Take 81 mg by mouth 2 (two) times daily.       . carvedilol (COREG) 6.25 MG tablet Take 6.25 mg by mouth 2 (two) times daily with a meal. Take 1 & 1/2 tablet twice daily      . chlorhexidine (PERIDEX) 0.12 % solution       . Cholecalciferol (VITAMIN D) 2000 UNITS CAPS Take 1 capsule by mouth daily.      . ciprofloxacin (CIPRO) 250 MG tablet Take 1 tablet (250 mg total) by mouth 2 (two) times daily. X 7 days  14 tablet  0  . clopidogrel (PLAVIX) 75 MG tablet Take 75 mg by mouth daily before breakfast.      . dexlansoprazole (DEXILANT) 60 MG capsule Take 1 capsule  (60 mg total) by mouth daily. Failed Protonix, Nexium  30 capsule  5  . ezetimibe-simvastatin (VYTORIN) 10-40 MG per tablet Take 1 tablet by mouth at bedtime.  30 tablet  5  . fluconazole (DIFLUCAN) 150 MG tablet  1 tab po daily x 3 days then 1 tab po weekly x 3 weeks  6 tablet  0  . gabapentin (NEURONTIN) 300 MG capsule Take 2 capsules (600 mg total) by mouth 2 (two) times daily.  60 capsule  5  . ILEVRO 0.3 % SUSP       . isosorbide mononitrate (IMDUR) 60 MG 24 hr tablet Take 1 tablet (60 mg total) by mouth daily before breakfast.  30 tablet  5  . levothyroxine (SYNTHROID, LEVOTHROID) 25 MCG tablet Take 25 mcg by mouth at bedtime.      . Liraglutide (VICTOZA) 18 MG/3ML SOLN injection Inject 1.8 mg into the skin daily.      Marland Kitchen losartan (COZAAR) 25 MG tablet Take 1 tablet (25 mg total) by mouth daily.  30 tablet  6  . metformin (FORTAMET) 1000 MG (OSM) 24 hr tablet TAKE 2 TABLETS BY MOUTH EVERY DAY WITH BREAKFAST  60 tablet  2  . Milnacipran (SAVELLA) 50 MG TABS Take 1 tablet (50 mg total) by mouth 2 (two) times daily.  60 tablet  5  . nitrofurantoin (MACRODANTIN) 50 MG capsule Take 1 capsule (50 mg total) by mouth at bedtime.  30 capsule  3  . ondansetron (ZOFRAN-ODT) 8 MG disintegrating tablet Take 1 tablet (8 mg total) by mouth every 8 (eight) hours as needed for nausea.  40 tablet  1  . ONE TOUCH ULTRA TEST test strip       . pantoprazole (PROTONIX) 40 MG tablet       . PREMARIN 0.3 MG tablet       . Probiotic Product (ALIGN) 4 MG CAPS Take 1 capsule by mouth daily.      . promethazine (PHENERGAN) 25 MG tablet Take 25 mg by mouth every 6 (six) hours as needed for nausea.      . ranitidine (ZANTAC) 300 MG tablet Take 1 tablet (300 mg total) by mouth at bedtime.  30 tablet  5  . traMADol (ULTRAM) 50 MG tablet Take 1 tablet (50 mg total) by mouth 2 (two) times daily.  120 tablet  1   No current facility-administered medications on file prior to visit.    Allergies  Allergen Reactions  .  Percocet (Oxycodone-Acetaminophen) Itching    Review of Systems  Review of Systems  Constitutional: Positive for malaise/fatigue. Negative for fever.  HENT: Negative for congestion.   Eyes: Negative for pain and discharge.  Respiratory: Negative for shortness of breath.   Cardiovascular: Negative for chest pain, palpitations and leg swelling.  Gastrointestinal: Negative for nausea, abdominal pain and diarrhea.  Genitourinary: Negative for dysuria.  Musculoskeletal: Positive for falls.  Skin: Negative for rash.  Neurological: Positive for dizziness. Negative for loss of consciousness and headaches.  Endo/Heme/Allergies: Negative for polydipsia.  Psychiatric/Behavioral: Negative for depression and suicidal ideas. The patient is not nervous/anxious and does not have insomnia.     Objective  BP 114/80  Pulse 104  Temp(Src) 97.4 F (36.3 C) (Oral)  Ht 5\' 1"  (1.549 m)  Wt 241 lb (109.317 kg)  BMI 45.56 kg/m2  SpO2 95%  Physical Exam  Physical Exam  Constitutional: She is oriented to person, place, and time and well-developed, well-nourished, and in no distress. No distress.  HENT:  Head: Normocephalic and atraumatic.  Eyes: Conjunctivae are normal.  Neck: Neck supple. No thyromegaly present.  Cardiovascular: Normal rate, regular rhythm and normal heart sounds.   No murmur heard. Pulmonary/Chest: Effort normal and breath sounds normal. She has  no wheezes.  Abdominal: She exhibits no distension and no mass.  Musculoskeletal: She exhibits no edema.  Lymphadenopathy:    She has no cervical adenopathy.  Neurological: She is alert and oriented to person, place, and time.  Skin: Skin is warm and dry. No rash noted. She is not diaphoretic.  Psychiatric: Memory, affect and judgment normal.    Lab Results  Component Value Date   TSH 4.236 11/08/2012   Lab Results  Component Value Date   WBC 9.6 11/08/2012   HGB 12.3 11/08/2012   HCT 36.6 11/08/2012   MCV 89.3 11/08/2012   PLT 325  11/08/2012   Lab Results  Component Value Date   CREATININE 0.80 10/22/2012   BUN 17 10/22/2012   NA 140 10/22/2012   K 4.2 10/22/2012   CL 102 10/22/2012   CO2 28 10/22/2012   Lab Results  Component Value Date   ALT 18 08/27/2012   AST 20 08/27/2012   ALKPHOS 78 08/27/2012   BILITOT 0.3 08/27/2012   Lab Results  Component Value Date   CHOL 153 07/27/2012   Lab Results  Component Value Date   HDL 37* 07/27/2012   Lab Results  Component Value Date   LDLCALC 66 07/27/2012   Lab Results  Component Value Date   TRIG 252* 07/27/2012   Lab Results  Component Value Date   CHOLHDL 4.1 07/27/2012     Assessment & Plan  Pericardial effusion Stable and now seen by cardiology, not thought to be playing a role in her current symptoms but is scheduled for more cardiac testing this week with her caridologist Dr Tresa Endo  Nausea with vomiting Still struggling with some nausea but no further vomitting since we began to treat her UTI. May try ginger caps tid with meals  UTI (lower urinary tract infection) Recurrent, tolerating antibiotics and feeling somewhat better. Repeat UA c&s after course of treatment may benefit from daily Macrodantin as a prophylaxis  ESSENTIAL HYPERTENSION, BENIGN Well controlled, no changes

## 2012-11-16 NOTE — Patient Instructions (Addendum)
Continue cranberry tab and probiotic  Urinary Tract Infection Urinary tract infections (UTIs) can develop anywhere along your urinary tract. Your urinary tract is your body's drainage system for removing wastes and extra water. Your urinary tract includes two kidneys, two ureters, a bladder, and a urethra. Your kidneys are a pair of bean-shaped organs. Each kidney is about the size of your fist. They are located below your ribs, one on each side of your spine. CAUSES Infections are caused by microbes, which are microscopic organisms, including fungi, viruses, and bacteria. These organisms are so small that they can only be seen through a microscope. Bacteria are the microbes that most commonly cause UTIs. SYMPTOMS  Symptoms of UTIs may vary by age and gender of the patient and by the location of the infection. Symptoms in young women typically include a frequent and intense urge to urinate and a painful, burning feeling in the bladder or urethra during urination. Older women and men are more likely to be tired, shaky, and weak and have muscle aches and abdominal pain. A fever may mean the infection is in your kidneys. Other symptoms of a kidney infection include pain in your back or sides below the ribs, nausea, and vomiting. DIAGNOSIS To diagnose a UTI, your caregiver will ask you about your symptoms. Your caregiver also will ask to provide a urine sample. The urine sample will be tested for bacteria and white blood cells. White blood cells are made by your body to help fight infection. TREATMENT  Typically, UTIs can be treated with medication. Because most UTIs are caused by a bacterial infection, they usually can be treated with the use of antibiotics. The choice of antibiotic and length of treatment depend on your symptoms and the type of bacteria causing your infection. HOME CARE INSTRUCTIONS  If you were prescribed antibiotics, take them exactly as your caregiver instructs you. Finish the  medication even if you feel better after you have only taken some of the medication.  Drink enough water and fluids to keep your urine clear or pale yellow.  Avoid caffeine, tea, and carbonated beverages. They tend to irritate your bladder.  Empty your bladder often. Avoid holding urine for long periods of time.  Empty your bladder before and after sexual intercourse.  After a bowel movement, women should cleanse from front to back. Use each tissue only once. SEEK MEDICAL CARE IF:   You have back pain.  You develop a fever.  Your symptoms do not begin to resolve within 3 days. SEEK IMMEDIATE MEDICAL CARE IF:   You have severe back pain or lower abdominal pain.  You develop chills.  You have nausea or vomiting.  You have continued burning or discomfort with urination. MAKE SURE YOU:   Understand these instructions.  Will watch your condition.  Will get help right away if you are not doing well or get worse. Document Released: 01/29/2005 Document Revised: 10/21/2011 Document Reviewed: 05/30/2011 [facility] Patient Information 2014 [place], [place].

## 2012-11-17 ENCOUNTER — Encounter (HOSPITAL_COMMUNITY): Payer: Self-pay

## 2012-11-17 ENCOUNTER — Ambulatory Visit (HOSPITAL_COMMUNITY)
Admission: RE | Admit: 2012-11-17 | Discharge: 2012-11-17 | Disposition: A | Discharge status: Home / Self Care | Payer: Medicare Other | Source: Ambulatory Visit | Attending: Cardiovascular Disease | Admitting: Cardiovascular Disease

## 2012-11-17 DIAGNOSIS — R0989 Other specified symptoms and signs involving the circulatory and respiratory systems: Secondary | ICD-10-CM | POA: Insufficient documentation

## 2012-11-17 DIAGNOSIS — I498 Other specified cardiac arrhythmias: Secondary | ICD-10-CM | POA: Insufficient documentation

## 2012-11-17 DIAGNOSIS — R0602 Shortness of breath: Secondary | ICD-10-CM | POA: Insufficient documentation

## 2012-11-17 DIAGNOSIS — R0609 Other forms of dyspnea: Secondary | ICD-10-CM | POA: Insufficient documentation

## 2012-11-17 DIAGNOSIS — I2510 Atherosclerotic heart disease of native coronary artery without angina pectoris: Secondary | ICD-10-CM

## 2012-11-17 DIAGNOSIS — R42 Dizziness and giddiness: Secondary | ICD-10-CM | POA: Insufficient documentation

## 2012-11-17 DIAGNOSIS — R5381 Other malaise: Secondary | ICD-10-CM | POA: Insufficient documentation

## 2012-11-17 DIAGNOSIS — R079 Chest pain, unspecified: Secondary | ICD-10-CM | POA: Insufficient documentation

## 2012-11-17 MED ORDER — REGADENOSON 0.4 MG/5ML IV SOLN
0.4000 mg | Freq: Once | INTRAVENOUS | Status: AC
  Administered 2012-11-17: 0.4 mg via INTRAVENOUS

## 2012-11-17 MED ORDER — TECHNETIUM TC 99M SESTAMIBI GENERIC - CARDIOLITE
10.6000 | Freq: Once | INTRAVENOUS | Status: AC | PRN
  Administered 2012-11-17: 11 via INTRAVENOUS

## 2012-11-17 MED ORDER — TECHNETIUM TC 99M SESTAMIBI GENERIC - CARDIOLITE
31.2000 | Freq: Once | INTRAVENOUS | Status: AC | PRN
  Administered 2012-11-17: 31.2 via INTRAVENOUS

## 2012-11-17 MED ORDER — AMINOPHYLLINE 25 MG/ML IV SOLN
125.0000 mg | Freq: Once | INTRAVENOUS | Status: AC
  Administered 2012-11-17: 125 mg via INTRAVENOUS

## 2012-11-17 NOTE — Assessment & Plan Note (Signed)
Well controlled, no changes 

## 2012-11-17 NOTE — Assessment & Plan Note (Signed)
Still struggling with some nausea but no further vomitting since we began to treat her UTI. May try ginger caps tid with meals

## 2012-11-17 NOTE — Procedures (Addendum)
[place]  [facility] [address] [place] [address] [phone]  Cardiology Nuclear Med Study  Amber [name] is a 67 y.o. 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 Weight:  214 lb (97.07 kg)   Tech Comments:  N/A    Nuclear Med Study 1 or 2 day study: 1 day  Stress Test Type:  Lexiscan  Order Authorizing Provider:  Nicki Guadalajara, MD   Resting Radionuclide: Technetium 17m Sestamibi  Resting Radionuclide Dose: 10.6 mCi   Stress Radionuclide:  Technetium 32m Sestamibi  Stress Radionuclide Dose: 31.2 mCi           Stress Protocol Rest HR: 107 Stress HR: 134  Rest BP: 133/99 Stress BP: 136/77  Exercise Time (min): n/a METS: n/a   Predicted Max HR: 152 bpm % Max HR: 88.16 bpm Rate Pressure Product: 21308  Dose of Adenosine (mg):  n/a Dose of Lexiscan: 0.4 mg  Dose of Atropine (mg): n/a Dose of Dobutamine: n/a mcg/kg/min (at max HR)  Stress Test Technologist: Esperanza Sheets, CCT Nuclear Technologist: Gonzella Lex, CNMT   Rest Procedure:  Myocardial perfusion imaging was performed at rest 45 minutes following the intravenous administration of Technetium 61m Sestamibi. Stress Procedure:  The patient received IV Lexiscan 0.4 mg over 15-seconds.  Technetium 75m Sestamibi injected at 30-seconds.  There were no significant changes with Lexiscan.   Quantitative spect images were obtained after a 45 minute delay.  Transient Ischemic Dilatation (Normal <1.22):  1.19 Lung/Heart Ratio (Normal <0.45):  0.24 QGS EDV:  n/a ml QGS ESV:  n/a ml LV Ejection Fraction: study not gated  Rest ECG: Sinus tachycardia  Stress ECG: No significant change from baseline ECG  QPS Raw Data Images:  Normal; no motion artifact; normal heart/lung ratio. Stress Images:  Normal homogeneous uptake in all areas of the myocardium. Rest Images:  Normal homogeneous uptake in all areas of the myocardium. Subtraction (SDS):  No evidence of ischemia.  Impression Exercise Capacity:  Lexiscan with no exercise. BP Response:  Normal blood pressure response. Clinical Symptoms:  There is dyspnea. ECG Impression:  No significant ECG changes with Lexiscan. Comparison with Prior Nuclear Study: No significant change from previous study  Overall Impression:  Normal stress nuclear study.  LV Wall Motion:  non-gated due to ectopy  Chrystie Nose, MD, [facility] Board Certified in Nuclear Cardiology Attending Cardiologist [facility]  Chrystie Nose, MD  11/17/2012 8:48 PM

## 2012-11-17 NOTE — Assessment & Plan Note (Signed)
Stable and now seen by cardiology, not thought to be playing a role in her current symptoms but is scheduled for more cardiac testing this week with her caridologist Dr Tresa Endo

## 2012-11-17 NOTE — Assessment & Plan Note (Signed)
Recurrent, tolerating antibiotics and feeling somewhat better. Repeat UA c&s after course of treatment may benefit from daily Macrodantin as a prophylaxis

## 2012-11-18 ENCOUNTER — Encounter: Payer: Self-pay | Admitting: *Deleted

## 2012-11-18 ENCOUNTER — Telehealth: Payer: Self-pay | Admitting: Cardiovascular Disease

## 2012-11-18 ENCOUNTER — Other Ambulatory Visit: Payer: Self-pay | Admitting: Family Medicine

## 2012-11-18 NOTE — Telephone Encounter (Signed)
Mr. Crass called about Amber [name] and he is confused as to what medications that she needs to be taken.

## 2012-11-18 NOTE — Telephone Encounter (Signed)
Returned call and spoke w/ pt's husband, Jesusita Oka.  Stated he can't remember which medicine Dr. Tresa Endo told pt to stop taking.  Reviewed chart and informed pt to STOP Lotrel and START Losartan.  Verbalized understanding and agreed w/ plan.

## 2012-11-19 ENCOUNTER — Encounter: Payer: Self-pay | Admitting: *Deleted

## 2012-11-22 ENCOUNTER — Telehealth: Payer: Self-pay | Admitting: Internal Medicine

## 2012-11-22 ENCOUNTER — Telehealth: Payer: Self-pay

## 2012-11-22 NOTE — Telephone Encounter (Signed)
pts spouse informed but states he can't get in for two weeks.  I told pts spouse to call and ask to be put on a cancellation list.

## 2012-11-22 NOTE — Telephone Encounter (Signed)
Needs to see gastroenterology for worsening nausea and vomiting and consideration of further scoping. I have largely exhausted my options. I can set up referral to get new appt if they want

## 2012-11-22 NOTE — Telephone Encounter (Signed)
Spoke with patient's husband. She has been treated for UTI. She is taking Diflucan. She is using Zofran for nausea and it it not helping. Scheduled with Willette Cluster, NP on 11/23/12 at 9:30 AM.

## 2012-11-22 NOTE — Telephone Encounter (Signed)
pts spouse left a message stating that patient is still vomiting and queasy. Pts spouse doesn't know what to do now and would like some advise?

## 2012-11-23 ENCOUNTER — Encounter: Payer: Self-pay | Admitting: Nurse Practitioner

## 2012-11-23 ENCOUNTER — Telehealth: Payer: Self-pay

## 2012-11-23 ENCOUNTER — Ambulatory Visit (INDEPENDENT_AMBULATORY_CARE_PROVIDER_SITE_OTHER): Payer: Medicare Other | Admitting: Nurse Practitioner

## 2012-11-23 VITALS — BP 110/56 | HR 82 | Ht 61.0 in | Wt 237.0 lb

## 2012-11-23 DIAGNOSIS — R112 Nausea with vomiting, unspecified: Secondary | ICD-10-CM

## 2012-11-23 DIAGNOSIS — N390 Urinary tract infection, site not specified: Secondary | ICD-10-CM

## 2012-11-23 NOTE — Progress Notes (Signed)
  History of Present Illness:  Patient is a 67 year old female with multiple medical problems including, but not limited to HTN, OSA on CPAP, morbid obesity, fibromyalgia, recurrent urinary tract infections ,diabetes, a small chronic pericardial effusion, and significant CAD requiring stents / chronic plavix.   She is on multiple medications. Patient has a family history of colon cancer in her sister. She had a screening colonoscopy with Dr. Juanda Chance in March of this year. Findings included 3 sessile polyps and moderate diverticulosis. 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   Patient is here with her husband (who prescribes much of the history ) for evaluation of a several week history of nausea and vomiting. She actually has multiple medical complaints.  Her nausea and vomiting initially occurred one-two times a week but now vomiting on a daily basis. Zofran doesn't help. A bland diet has helped to some degree. She complains of excessive belching. She has been dizzy and fell at home. She sweats excessively with even light activity. She feels tired, depressed and anxious.  Blood sugars running in 120's. No documented weight loss. Recent labs reveal white count of 11.4, normal hemoglobin, normal erythrocyte sedimentation rate, normal thyroid-stimulating hormone  Current Medications, Allergies, Past Medical History, Past Surgical History, Family History and Social History were reviewed in [employer] record.  Physical Exam: General: pleasant white female in no acute distress Head: Normocephalic and atraumatic Eyes:  sclerae anicteric, conjunctiva pink  Ears: Normal auditory acuity Lungs: Clear throughout to auscultation Heart: Regular rate and rhythm Abdomen: Soft, obese, non-tender. No masses, no hepatomegaly. Normal bowel sounds Musculoskeletal: Symmetrical with no gross deformities  Extremities: No edema  Neurological: Alert oriented x 4, grossly nonfocal Psychological:  Alert and  cooperative. Normal mood and affect  Assessment and Recommendations:  43. 67 year old female with multiple, significant medical problems, on chronic Plavix.  2. subacute nausea and vomiting, progressive. Patieint is on a proton pump inhibitor at home. No documented weight loss. 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 She will be scheduled for EGD with propofol. The benefits, risks, and potential complications of EGD with possible biopsies were discussed with the patient and she agrees to proceed.  Will make sure Dr. Juanda Chance is okay with proceeding on Plavix. Otherwise we will need to contact prescribing MD about holding it for procedure.

## 2012-11-23 NOTE — Telephone Encounter (Signed)
Lab order placed.

## 2012-11-23 NOTE — Patient Instructions (Addendum)
You have been scheduled for an endoscopy with propofol. Please follow written instructions given to you at your visit today. If you use inhalers (even only as needed), please bring them with you on the day of your procedure. Your physician has requested that you go to www.startemmi.com and enter the access code given to you at your visit today. This web site gives a general overview about your procedure. However, you should still follow specific instructions given to you by our office regarding your preparation for the procedure. CC:  Danise Edge MD

## 2012-11-24 ENCOUNTER — Encounter: Payer: Self-pay | Admitting: Nurse Practitioner

## 2012-11-24 NOTE — Progress Notes (Signed)
She can stay on Plavix for EGD

## 2012-11-25 ENCOUNTER — Telehealth: Payer: Self-pay

## 2012-11-25 NOTE — Telephone Encounter (Signed)
Message copied by Donata Duff on Thu Nov 25, 2012  9:35 AM ------      Message from: Meredith Pel      Created: Thu Nov 25, 2012  9:18 AM       Trula Slade,      Dr. Juanda Chance said patient can stay on Plavix for colonoscopy. Will you please let patient know? Thanks ------

## 2012-11-25 NOTE — Telephone Encounter (Signed)
Pt has been notified that she can stay on Plavix for her procdure

## 2012-11-26 ENCOUNTER — Telehealth: Payer: Self-pay | Admitting: *Deleted

## 2012-11-26 ENCOUNTER — Other Ambulatory Visit: Payer: Self-pay | Admitting: *Deleted

## 2012-11-26 DIAGNOSIS — R112 Nausea with vomiting, unspecified: Secondary | ICD-10-CM

## 2012-11-26 NOTE — Telephone Encounter (Signed)
Booking number for procedure- [phone]. Patient's husband notified of procedure change to [facility] endo on 12/27/12 at 12:00 PM with 10:30 AM arrival to registration. Reviewed changes in time for NPO(8 AM)

## 2012-11-26 NOTE — Telephone Encounter (Signed)
Scheduled on 12/27/12 at 12:00 PM at [facility] endo(Jill)

## 2012-11-26 NOTE — Telephone Encounter (Signed)
I agree, chart reviewed, will reschedule for hospital setting with propofol. Adryana Mogensen, could we, please ,reschedule this pt for W/L Profpofol? Your pick of the day and time. Thanx ----- Message ----- From: Cathlyn Parsons, CRNA Sent: 11/26/2012 4:45 AM To: Clarnce Flock, Hart Carwin, MD, *

## 2012-11-27 ENCOUNTER — Other Ambulatory Visit: Payer: Self-pay | Admitting: Family Medicine

## 2012-11-29 ENCOUNTER — Encounter (HOSPITAL_COMMUNITY): Payer: Self-pay | Admitting: Pharmacy Technician

## 2012-12-01 ENCOUNTER — Encounter: Payer: Medicare Other | Admitting: Internal Medicine

## 2012-12-02 ENCOUNTER — Encounter: Payer: Self-pay | Admitting: Cardiovascular Disease

## 2012-12-03 ENCOUNTER — Ambulatory Visit: Payer: Medicare Other | Admitting: Internal Medicine

## 2012-12-06 ENCOUNTER — Encounter (HOSPITAL_COMMUNITY): Payer: Self-pay | Admitting: *Deleted

## 2012-12-06 NOTE — Progress Notes (Signed)
Patient and spouse say no one has instructed them about to continue plavix  Or stop for 12-27-12 procedure.

## 2012-12-06 NOTE — Progress Notes (Signed)
Routed note to dr Juanda Chance by epic, does pt need to stop plavix prior to procedure

## 2012-12-07 ENCOUNTER — Ambulatory Visit (INDEPENDENT_AMBULATORY_CARE_PROVIDER_SITE_OTHER): Payer: Medicare Other | Admitting: Cardiovascular Disease

## 2012-12-07 ENCOUNTER — Telehealth: Payer: Self-pay | Admitting: Cardiovascular Disease

## 2012-12-07 ENCOUNTER — Encounter: Payer: Self-pay | Admitting: Cardiovascular Disease

## 2012-12-07 VITALS — BP 132/86 | HR 97 | Ht 61.0 in | Wt 248.8 lb

## 2012-12-07 DIAGNOSIS — I2510 Atherosclerotic heart disease of native coronary artery without angina pectoris: Secondary | ICD-10-CM

## 2012-12-07 DIAGNOSIS — E119 Type 2 diabetes mellitus without complications: Secondary | ICD-10-CM

## 2012-12-07 DIAGNOSIS — E785 Hyperlipidemia, unspecified: Secondary | ICD-10-CM

## 2012-12-07 NOTE — Telephone Encounter (Signed)
Amber [name] is having pains from the center of her chest radiating under her right arm to the center of her back  Would like to speak to someone please call. Thanks

## 2012-12-07 NOTE — Patient Instructions (Addendum)
Your physician recommends that you schedule a follow-up appointment in: 6 MONTHS. No changes has been made in your therapy today. 

## 2012-12-07 NOTE — Telephone Encounter (Signed)
Returned call and spoke w/ Amber [name] and Amber [name].  Stated Amber [name] c/o intermittent CP radiating to R arm and back.  Stated it started about 10 mins ago.  Amber [name] on phone and rated pain 5/10 and sharp.  Denied SOB.  No NTG.  Dr. Tresa Endo notified and stated Amber [name] informed him of the CP at visit today and he doesn't think it's cardiac related.  Advised Amber [name] f/u with PCP r/t to it.    Informed Amber [name] and he stated Amber [name] stated the pain is different.  Advised he take Amber [name] to ER for evaluation and f/u with PCP or Dr. Tresa Endo as instructed.  Verbalized understanding and agreed w/ plan.

## 2012-12-08 ENCOUNTER — Other Ambulatory Visit: Payer: Self-pay

## 2012-12-13 ENCOUNTER — Telehealth: Payer: Self-pay | Admitting: Internal Medicine

## 2012-12-13 NOTE — Telephone Encounter (Signed)
Left a message for Jasmine December that per telephone note on 11/25/12, patient can stay on Plavix for procedure.

## 2012-12-13 NOTE — Progress Notes (Signed)
Spoke with dr rose ok to use 11-08-2012 chest ct results for 12-27-2012 procedure in endo

## 2012-12-13 NOTE — Progress Notes (Signed)
Message left by Jesse Fall dr brodie nurse per 11-25-2012 telephone note pt has been instructed she can stay on plavix for 12-27-2012 procedure spoke with pt spouse dan Wollen and made aware ok to continue plavix

## 2012-12-13 NOTE — Progress Notes (Signed)
Message left for [employer]  dr Juanda Chance nurse, does pt need to stop plavix for 12-27-12 procedure

## 2012-12-15 ENCOUNTER — Ambulatory Visit: Payer: Medicare Other | Admitting: Family Medicine

## 2012-12-15 NOTE — ED Notes (Signed)
Amber [name] came by and requested a copy of his wife's note to restrict flying in Feb 2014, due to illness.

## 2012-12-19 ENCOUNTER — Encounter: Payer: Self-pay | Admitting: Cardiovascular Disease

## 2012-12-19 NOTE — Progress Notes (Signed)
Patient ID: Simmie Davies, female   DOB: [birth year], 67 y.o.   MRN: [id number]     HPI: ELEANER DIBARTOLO, is a 67 y.o. female who presents to the office today for follow up evaluation.  Mrs Tidd is a very pleasant 67 year old female with history of morbid obesity, and significant coronary obstructive disease. She has undergone remote interventions to LAD, circumflex, as well as right coronary artery. Her last intervention was done in April 2009 with a new stent was placed proximal to her previously placed LAD stent and now extended into the the ostial region. Additional problems also include hypertension, obstructive sleep apnea on CPAP therapy, morbid obesity, type 2 diabetes mellitus, GERD, hyperlipidemia, as well as a small chronic pericardial effusion. She's continued to have problems with recurrent urinary tract infections and has seen Dr. Laverle Patter for this as well as kidney stones he recently completed a course of antibiotics he was recently seen by her primary care echo Doppler study was done on 11/03/2012 at [facility] which revealed an ejection fraction in the 55-65% range. She did not have focal wall motion abnormalities. Mildly calcified anulus of her aortic valve. There was no aortic stenosis. There was noted a prominent pericardial fat pad but there also was evidence for a partially loculated pericardial effusion posterior to the heart. She did not have any findings of cancer not. Apparently she was referred by her primary physician for a CT scan which again showed the chronic pericardial effusion. She did have coronary artery disease with stents noted. There is also stable granulomatous changes in the mediastinum and right lower lobe. Ms. Crossman has complained of weakness and  shortness of breath. Since I last saw her she underwent a two-year followup nuclear perfusion study which was done on 11/17/2012. This continued to be normal and did not demonstrate any region of scar  or ischemia. Wall motion could not be assessed due to ectopy. She tells me she is in need for cataract surgery. She denies recent chest pressure. When I last saw Mrs. Malter her blood pressure was low, and I discontinued her Lotrel and in its place started her on low-dose ARB therapy with losartan. She now presents for evaluation.  Past Medical History  Diagnosis Date  . Thyroid disease   . Hyperlipidemia   . Diabetes mellitus     type 2  . Hypoxia 10/07/2010  . Skin lesion of back 10/18/2010  . Viral infection characterized by skin and mucous membrane lesions 10/18/2010  . Vitamin D deficiency 11/21/2010  . Back pain 11/21/2010  . Motion sickness 12/04/2010  . UTI (lower urinary tract infection) 09/08/2011  . Hot flashes 10/06/2011  . Bronchitis 02/28/2012  . Tubular adenoma of colon 2011, 2014  . Hypothyroidism   . Depression   . GERD (gastroesophageal reflux disease)   . Arthritis   . Leukocytosis, unspecified 10/24/2012  . Hypertension 11/13/10    ECHO- EF >55%-Left Ventricular systolic normal.Mild mitral regurgitation. Mild tricupsid regurgitation. There is a small posterior pericardial perfusion. No hemodynamic compromise.   . Coronary artery disease 11/13/10    Nuclear stress test-normal low risk scan. EF 63%.  . 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 Cataract 10/08/2010  . Diverticulitis   . Heart murmur     MVP  . Renal lithiasis 08/28/2012  . Fibromyalgia   . Complication of anesthesia     woke up while tube still inserted 1 shoulder surgery in [place]  . Myocardial infarction   .  Sleep apnea 06/01/06    sleep study [facility]- 279 arousals with index of 52.0/hr for total sleep time.Marland Kitchen AHI 51.99/hr during total sleep. 72.00/hr during REM sleep highest heart rate awake was 92 beats/ min lowest awake was 72. Highest heartrate while asleep was 92 and the lowest was 61.  . On supplemental oxygen therapy     uses 2 1/2 liter oxygen per mask at hs    Past Surgical  History  Procedure Laterality Date  . Angioplasty  1980  . Angioplasty  2006    3 stents  . Angioplasty  2009    2 stents  . Thyroidectomy  1950    for goiter  . Diverticulitis  1978    exploratory  . Right rotator cuff repair  2004  . Left rotator cuff repair  2004  . Bone fusion left foot  2010    7 screws in foot, bunionectomy  . Hip surgery  2008    large bone spur removed, right  . Colonoscopy N/A 07/26/2012    Procedure: COLONOSCOPY;  Surgeon: Hart Carwin, MD;  Location: [facility];  Service: Endoscopy;  Laterality: N/A;  . Stent placement  2013    x 3  . Back surgery  2004    lumbar discectomy for bulging disc  . Abdominal hysterectomy  1977    partial  . Cholecystectomy  1970's    Allergies  Allergen Reactions  . Percocet [Oxycodone-Acetaminophen] Itching    Current Outpatient Prescriptions  Medication Sig Dispense Refill  . allopurinol (ZYLOPRIM) 100 MG tablet Take 100 mg by mouth daily.      Marland Kitchen aspirin EC 81 MG tablet Take 81 mg by mouth 2 (two) times daily.      . carvedilol (COREG) 6.25 MG tablet Take 6.25 mg by mouth 2 (two) times daily with a meal. Takes 1/2 of 6.25 pill bid      . Cholecalciferol (VITAMIN D) 2000 UNITS tablet Take 2,000 Units by mouth daily. D 3      . citalopram (CELEXA) 40 MG tablet Take 40 mg by mouth every morning.      . clopidogrel (PLAVIX) 75 MG tablet Take 75 mg by mouth daily before breakfast.      . ezetimibe-simvastatin (VYTORIN) 10-40 MG per tablet Take 1 tablet by mouth at bedtime.      . Flaxseed, Linseed, (FLAX SEEDS PO) Take 1 capsule by mouth 2 (two) times daily. 1200 mg bid      . gabapentin (NEURONTIN) 300 MG capsule Take 300 mg by mouth 2 (two) times daily.      . isosorbide mononitrate (IMDUR) 60 MG 24 hr tablet Take 60 mg by mouth daily before breakfast.      . levothyroxine (SYNTHROID, LEVOTHROID) 25 MCG tablet Take 25 mcg by mouth at bedtime.      . Liraglutide (VICTOZA) 18 MG/3ML SOPN Inject 1.8 mg into the skin  daily.      Marland Kitchen losartan (COZAAR) 25 MG tablet Take 25 mg by mouth daily.      . metformin (FORTAMET) 1000 MG (OSM) 24 hr tablet Take 1,000 mg by mouth daily with breakfast. Takes 2 1000 mg tabs daily      . Milnacipran (SAVELLA) 50 MG TABS tablet Take 50 mg by mouth every morning.      . ondansetron (ZOFRAN-ODT) 8 MG disintegrating tablet Take 8 mg by mouth every 8 (eight) hours as needed for nausea.      . pantoprazole (  PROTONIX) 40 MG tablet Take 1 tablet by mouth daily.      . Probiotic Product (ALIGN) 4 MG CAPS Take 4 mg by mouth daily.      . ranitidine (ZANTAC) 300 MG capsule Take 300 mg by mouth at bedtime as needed and may repeat dose one time if needed for heartburn.      Marland Kitchen UNABLE TO FIND CPAP THERAPY       No current facility-administered medications for this visit.    Socially she is married has 3 children 5 grandchildren. She is unable to exercise. She denies tobacco alcohol use.  ROS is negative for fevers, chills or night sweats. She recently completed therapy for an abscessed tooth the She denies presyncope or syncope. He does admit to some mild shortness of breath and easy fatigability. She has had issues with chronic urinary tract infections. She does note shortness of breath the she denies pleuritic chest pain. She denies blood in her stool or urine. She does note some occasional leg swelling intermittently. She denies tremors.   Other system review is negative.  PE BP 132/86  Pulse 97  Ht 5\' 1"  (1.549 m)  Wt 248 lb 12.8 oz (112.855 kg)  BMI 47.03 kg/m2  Repeat BP my me 96/70. General: Alert, oriented, no distress; morbid obesity with a body mass index of 47 Skin: normal turgor, no rashes HEENT: Normocephalic, atraumatic. Pupils round and reactive; sclera anicteric;no lid lag.  Nose without nasal septal hypertrophy Mouth/Parynx benign; Mallinpatti scale 3 Neck: No JVD, no carotid briuts Lungs: clear to ausculatation and percussion; no wheezing or rales Heart: RRR, s1 s2  normal 1/6 systolic murmur at aortic region. There is no evidence for any pericardial friction rub. Abdomen: Morbidly obese; soft, nontender; no hepatosplenomehaly, BS+; abdominal aorta nontender and not dilated by palpation. Pulses 2+ Extremities: no clubbing cyanosis or edema, Homan's sign negative  Neurologic: grossly nonfocal   LABS:  BMET    Component Value Date/Time   NA 140 10/22/2012 1552   K 4.2 10/22/2012 1552   CL 102 10/22/2012 1552   CO2 28 10/22/2012 1552   GLUCOSE 113* 10/22/2012 1552   BUN 17 10/22/2012 1552   CREATININE 0.80 10/22/2012 1552   CREATININE 0.7 08/27/2012 1523   CALCIUM 9.8 10/22/2012 1552   GFRNONAA >60 09/25/2009 1010   GFRAA  Value: >60        The eGFR has been calculated using the MDRD equation. This calculation has not been validated in all clinical situations. eGFR's persistently <60 mL/min signify possible Chronic Kidney Disease. 09/25/2009 1010     Hepatic Function Panel     Component Value Date/Time   PROT 6.2 08/27/2012 1523   ALBUMIN 4.3 10/22/2012 1552   AST 20 08/27/2012 1523   ALT 18 08/27/2012 1523   ALKPHOS 78 08/27/2012 1523   BILITOT 0.3 08/27/2012 1523   BILIDIR 0.1 08/27/2012 1523   IBILI 0.3 07/27/2012 1007     CBC    Component Value Date/Time   WBC 9.6 11/08/2012 0921   RBC 4.10 11/08/2012 0921   HGB 12.3 11/08/2012 0921   HCT 36.6 11/08/2012 0921   PLT 325 11/08/2012 0921   MCV 89.3 11/08/2012 0921   MCH 30.0 11/08/2012 0921   MCHC 33.6 11/08/2012 0921   RDW 16.0* 11/08/2012 0921   LYMPHSABS 2.9 12/27/2010 1526   MONOABS 0.8 12/27/2010 1526   EOSABS 0.2 12/27/2010 1526   BASOSABS 0.0 12/27/2010 1526     BNP  Component Value Date/Time   PROBNP <30.0 04/23/2007 1740    Lipid Panel     Component Value Date/Time   CHOL 153 07/27/2012 1007   TRIG 252* 07/27/2012 1007   HDL 37* 07/27/2012 1007   CHOLHDL 4.1 07/27/2012 1007   VLDL 50* 07/27/2012 1007   LDLCALC 66 07/27/2012 1007     RADIOLOGY: Ct Chest W Contrast  11/08/2012   *RADIOLOGY  REPORT*  Clinical Data: Shortness of breath.  CT.  Pericardial effusion. Pulmonary nodule.  CT CHEST WITH CONTRAST  Technique:  Multidetector CT imaging of the chest was performed following the standard protocol during bolus administration of intravenous contrast.  Contrast: 80mL OMNIPAQUE IOHEXOL 300 MG/ML  SOLN  Comparison: Two-view chest 06/14/2012.  CTA chest 01/03/2009.  Findings: The heart size is within normal limits.  The coronary stents are in place.  Atherosclerotic calcification is present within the coronary arteries.  The chronic pericardial effusion has increased since the prior CT head.  No significant mediastinal or axillary adenopathy is present. Calcified nodes at the azygo- esophageal recess are stable.  The thoracic inlet is within normal limits.  Limited imaging of the upper abdomen is unremarkable.  A calcified granuloma at the right lung base is stable. A 3 mm nodule is evident along the left major fissure.  The bone windows demonstrate fused anterior osteophytes across multiple levels.  No focal lytic or blastic lesions are evident. The ribs are unremarkable.  IMPRESSION:  1.  Slight interval increase and a chronic pericardial effusion. 2.  Coronary artery disease with stents in place. 3.  Stable granulomatous changes in the mediastinum and right lower lobe.   Original Report Authenticated By: Marin Roberts, M.D.      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 In November 2006 underwent staged intervention to RCA, circumflex and LAD. Her last intervention was in April 2009  new 30x23 mm Cypher stent was inserted proximal to the previously placed LAD stent and extended to the LAD ostium.  She has been demonstrated to have small pericardial effusion without tamponade hemodynamics. When I last saw her, I discontinued her legs or L. and started her in it's place on losartan initially at 25  mg. She has tolerated this. Her blood pressure today is well controlled at 118/70 when taken by me. I did review her nuclear perfusion study in detail which continues to show normal perfusion and argues against restenosis or significant CAD progression. I have given her clearance to undergo her cataract surgery. I suggested that she hold her aspirin and Plavix for at least 5 days prior to the procedure. As long as she remains stable, I will see her in 6 months for cardiology reevaluation.   Lennette Bihari, MD, [facility]  12/19/2012 12:54 PM

## 2012-12-28 ENCOUNTER — Telehealth: Payer: Self-pay | Admitting: Internal Medicine

## 2012-12-28 NOTE — Telephone Encounter (Signed)
Left a message for patient to call me. 

## 2012-12-29 ENCOUNTER — Other Ambulatory Visit: Payer: Self-pay | Admitting: Family Medicine

## 2012-12-29 ENCOUNTER — Other Ambulatory Visit: Payer: Self-pay | Admitting: *Deleted

## 2012-12-29 NOTE — Telephone Encounter (Signed)
Faxed refill request received from pharmacy for Escitalopram 20 mg Last filled by MD on 03.25.14 [D/C date of 06.20.14 in EMR] Last AEX - 07.15.14 [Rx not reported taken by pt & not on active medication list] Denied Request: Pt needs to contact provider/SLS

## 2012-12-29 NOTE — Telephone Encounter (Signed)
Spoke with patient's husband and patient is doing great. No more symptoms. She had her heart medication changed. Patient does not want the EGD on Monday.

## 2012-12-29 NOTE — Telephone Encounter (Signed)
OK, but no refills on PPI. Please take her name off the schedule so we can use her s[pot if we need to.

## 2012-12-29 NOTE — Telephone Encounter (Signed)
Left a message for patient to call me. 

## 2012-12-30 NOTE — Telephone Encounter (Signed)
Cancelled procedure at [facility] endo(Jill).

## 2013-01-05 ENCOUNTER — Other Ambulatory Visit: Payer: Self-pay | Admitting: *Deleted

## 2013-01-05 ENCOUNTER — Other Ambulatory Visit: Payer: Self-pay | Admitting: Family Medicine

## 2013-01-05 NOTE — Telephone Encounter (Signed)
Denied request; Discontinued by provider 07.28.14; patient should contact provider/SLS

## 2013-01-10 ENCOUNTER — Ambulatory Visit (HOSPITAL_COMMUNITY): Admission: RE | Admit: 2013-01-10 | Payer: Medicare Other | Source: Ambulatory Visit | Admitting: Internal Medicine

## 2013-01-10 HISTORY — DX: Adverse effect of unspecified anesthetic, initial encounter: T41.45XA

## 2013-01-10 HISTORY — DX: Other complications of anesthesia, initial encounter: T88.59XA

## 2013-01-10 HISTORY — DX: Dependence on supplemental oxygen: Z99.81

## 2013-01-10 SURGERY — EGD (ESOPHAGOGASTRODUODENOSCOPY)
Anesthesia: Monitor Anesthesia Care

## 2013-01-11 ENCOUNTER — Other Ambulatory Visit: Payer: Self-pay | Admitting: Family Medicine

## 2013-02-08 ENCOUNTER — Encounter: Payer: Self-pay | Admitting: Family Medicine

## 2013-02-08 ENCOUNTER — Ambulatory Visit (INDEPENDENT_AMBULATORY_CARE_PROVIDER_SITE_OTHER): Payer: Medicare Other | Admitting: Family Medicine

## 2013-02-08 VITALS — BP 102/70 | HR 94 | Temp 98.2°F | Ht 61.0 in | Wt 239.1 lb

## 2013-02-08 DIAGNOSIS — K5732 Diverticulitis of large intestine without perforation or abscess without bleeding: Secondary | ICD-10-CM

## 2013-02-08 DIAGNOSIS — F341 Dysthymic disorder: Secondary | ICD-10-CM

## 2013-02-08 DIAGNOSIS — E119 Type 2 diabetes mellitus without complications: Secondary | ICD-10-CM

## 2013-02-08 DIAGNOSIS — I10 Essential (primary) hypertension: Secondary | ICD-10-CM

## 2013-02-08 DIAGNOSIS — K5900 Constipation, unspecified: Secondary | ICD-10-CM

## 2013-02-08 DIAGNOSIS — R1032 Left lower quadrant pain: Secondary | ICD-10-CM

## 2013-02-08 DIAGNOSIS — Z23 Encounter for immunization: Secondary | ICD-10-CM

## 2013-02-08 DIAGNOSIS — K5730 Diverticulosis of large intestine without perforation or abscess without bleeding: Secondary | ICD-10-CM

## 2013-02-08 DIAGNOSIS — F418 Other specified anxiety disorders: Secondary | ICD-10-CM

## 2013-02-08 LAB — RENAL FUNCTION PANEL
Albumin: 4 g/dL (ref 3.5–5.2)
BUN: 18 mg/dL (ref 6–23)
Calcium: 9.8 mg/dL (ref 8.4–10.5)
Phosphorus: 3.3 mg/dL (ref 2.3–4.6)
Potassium: 4.2 mEq/L (ref 3.5–5.3)

## 2013-02-08 LAB — CBC
MCHC: 33.2 g/dL (ref 30.0–36.0)
MCV: 92.3 fL (ref 78.0–100.0)
Platelets: 297 10*3/uL (ref 150–400)
RDW: 13.9 % (ref 11.5–15.5)
WBC: 9.5 10*3/uL (ref 4.0–10.5)

## 2013-02-08 LAB — HEPATIC FUNCTION PANEL
ALT: 15 U/L (ref 0–35)
Albumin: 4 g/dL (ref 3.5–5.2)
Indirect Bilirubin: 0.2 mg/dL (ref 0.0–0.9)
Total Protein: 6.6 g/dL (ref 6.0–8.3)

## 2013-02-08 MED ORDER — METRONIDAZOLE 500 MG PO TABS: 500.0000 mg | ORAL_TABLET | Freq: Three times a day (TID) | ORAL | Status: DC

## 2013-02-08 MED ORDER — CIPROFLOXACIN HCL 500 MG PO TABS: 500.0000 mg | ORAL_TABLET | Freq: Two times a day (BID) | ORAL | Status: DC

## 2013-02-08 MED ORDER — ESCITALOPRAM OXALATE 20 MG PO TABS: 20.0000 mg | ORAL_TABLET | Freq: Every day | ORAL | Status: DC

## 2013-02-08 NOTE — Assessment & Plan Note (Signed)
Well controlled, no changes 

## 2013-02-08 NOTE — Patient Instructions (Addendum)
Diverticulitis °A diverticulum is a small pouch or sac on the colon. Diverticulosis is the presence of these diverticula on the colon. Diverticulitis is the irritation (inflammation) or infection of diverticula. °CAUSES  °The colon and its diverticula contain bacteria. If food particles block the tiny opening to a diverticulum, the bacteria inside can grow and cause an increase in pressure. This leads to infection and inflammation and is called diverticulitis. °SYMPTOMS  °· Abdominal pain and tenderness. Usually, the pain is located on the left side of your abdomen. However, it could be located elsewhere. °· Fever. °· Bloating. °· Feeling sick to your stomach (nausea). °· Throwing up (vomiting). °· Abnormal stools. °DIAGNOSIS  °Your caregiver will take a history and perform a physical exam. Since many things can cause abdominal pain, other tests may be necessary. Tests may include: °· Blood tests. °· Urine tests. °· X-ray of the abdomen. °· CT scan of the abdomen. °Sometimes, surgery is needed to determine if diverticulitis or other conditions are causing your symptoms. °TREATMENT  °Most of the time, you can be treated without surgery. Treatment includes: °· Resting the bowels by only having liquids for a few days. As you improve, you will need to eat a low-fiber diet. °· Intravenous (IV) fluids if you are losing body fluids (dehydrated). °· Antibiotic medicines that treat infections may be given. °· Pain and nausea medicine, if needed. °· Surgery if the inflamed diverticulum has burst. °HOME CARE INSTRUCTIONS  °· Try a clear liquid diet (broth, tea, or water for as long as directed by your caregiver). You may then gradually begin a low-fiber diet as tolerated.  °A low-fiber diet is a diet with less than 10 grams of fiber. Choose the foods below to reduce fiber in the diet: °· White breads, cereals, rice, and pasta. °· Cooked fruits and vegetables or soft fresh fruits and vegetables without the skin. °· Ground or  well-cooked tender beef, ham, veal, lamb, pork, or poultry. °· Eggs and seafood. °· After your diverticulitis symptoms have improved, your caregiver may put you on a high-fiber diet. A high-fiber diet includes 14 grams of fiber for every 1000 calories consumed. For a standard 2000 calorie diet, you would need 28 grams of fiber. Follow these diet guidelines to help you increase the fiber in your diet. It is important to slowly increase the amount fiber in your diet to avoid gas, constipation, and bloating. °· Choose whole-grain breads, cereals, pasta, and brown rice. °· Choose fresh fruits and vegetables with the skin on. Do not overcook vegetables because the more vegetables are cooked, the more fiber is lost. °· Choose more nuts, seeds, legumes, dried peas, beans, and lentils. °· Look for food products that have greater than 3 grams of fiber per serving on the Nutrition Facts label. °· Take all medicine as directed by your caregiver. °· If your caregiver has given you a follow-up appointment, it is very important that you go. Not going could result in lasting (chronic) or permanent injury, pain, and disability. If there is any problem keeping the appointment, call to reschedule. °SEEK MEDICAL CARE IF:  °· Your pain does not improve. °· You have a hard time advancing your diet beyond clear liquids. °· Your bowel movements do not return to normal. °SEEK IMMEDIATE MEDICAL CARE IF:  °· Your pain becomes worse. °· You have an oral temperature above 102° F (38.9° C), not controlled by medicine. °· You have repeated vomiting. °· You have bloody or black, tarry stools. °·   Symptoms that brought you to your caregiver become worse or are not getting better. °MAKE SURE YOU:  °· Understand these instructions. °· Will watch your condition. °· Will get help right away if you are not doing well or get worse. °Document Released: 01/29/2005 Document Revised: 07/14/2011 Document Reviewed: 05/27/2010 °[employer]® Patient Information  ©2014 [employer]. ° °

## 2013-02-08 NOTE — Progress Notes (Signed)
Patient ID: Amber [name], female   DOB: January 16, 1946, 67 y.o.   MRN: [id number] Amber [name] [id number] Oct 27, 1945 02/08/2013      Progress Note-Follow Up  Subjective  Chief Complaint  Chief Complaint  Patient presents with  . Diverticulitis  . Injections    flu    HPI  Patient is a 67 year old Caucasian female who is in today for evaluation of worsening abdominal pain. She's been struggling with some nausea but no vomiting and anorexia for several days now. Has developed some worsening left lower quadrant pain after eating. Her stools have been somewhat loose but nonbloody. She's had some low-grade 100.1 fevers and chills, malaise and myalgias. Urinary frequency is also noted. No increased back pain. No change in sugars. Taking meds as prescribed. No chest pain, palpitations, shortness of breath.  Past Medical History  Diagnosis Date  . Thyroid disease   . Hyperlipidemia   . Diabetes mellitus     type 2  . Hypoxia 10/07/2010  . Skin lesion of back 10/18/2010  . Viral infection characterized by skin and mucous membrane lesions 10/18/2010  . Vitamin D deficiency 11/21/2010  . Back pain 11/21/2010  . Motion sickness 12/04/2010  . UTI (lower urinary tract infection) 09/08/2011  . Hot flashes 10/06/2011  . Bronchitis 02/28/2012  . Tubular adenoma of colon 2011, 2014  . Hypothyroidism   . Depression   . GERD (gastroesophageal reflux disease)   . Arthritis   . Leukocytosis, unspecified 10/24/2012  . Hypertension 11/13/10    ECHO- EF >55%-Left Ventricular systolic normal.Mild mitral regurgitation. Mild tricupsid regurgitation. There is a small posterior pericardial perfusion. No hemodynamic compromise.   . Coronary artery disease 11/13/10    Nuclear stress test-normal low risk scan. EF 63%.  . 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 Cataract 10/08/2010  . Diverticulitis   . Heart murmur     MVP  . Renal lithiasis 08/28/2012  . Fibromyalgia   . Complication of anesthesia      woke up while tube still inserted 1 shoulder surgery in pasy  . Myocardial infarction   . Sleep apnea 06/01/06    sleep study  [facility]- 279 arousals with index of 52.0/hr for total sleep time.Marland Kitchen AHI 51.99/hr during total sleep. 72.00/hr during REM sleep highest heart rate awake was 92 beats/ min lowest awake was 72. Highest heartrate while asleep was 92 and the lowest was 61.  . On supplemental oxygen therapy     uses 2 1/2 liter oxygen per mask at hs    Past Surgical History  Procedure Laterality Date  . Angioplasty  1980  . Angioplasty  2006    3 stents  . Angioplasty  2009    2 stents  . Thyroidectomy  1950    for goiter  . Diverticulitis  1978    exploratory  . Right rotator cuff repair  2004  . Left rotator cuff repair  2004  . Bone fusion left foot  2010    7 screws in foot, bunionectomy  . Hip surgery  2008    large bone spur removed, right  . Colonoscopy N/A 07/26/2012    Procedure: COLONOSCOPY;  Surgeon: Hart Carwin, MD;  Location: [facility];  Service: Endoscopy;  Laterality: N/A;  . Stent placement  2013    x 3  . Back surgery  2004    lumbar discectomy for bulging disc  . Abdominal hysterectomy  1977    partial  .  Cholecystectomy  1970's    Family History  Problem Relation Age of Onset  . Heart disease Father   . Diabetes Sister   . Hypertension Sister   . Depression Sister   . Colon cancer Sister     s/p colectomy  . Emphysema Brother     smoker  . Diabetes Daughter   . Hypertension Daughter   . Depression Daughter   . Diverticulitis Daughter   . Other Daughter     Trichotrilomania  . Aneurysm Maternal Grandmother     brain aneurysm  . Diabetes Paternal Grandmother   . Hypertension Paternal Grandmother   . Colon cancer Paternal Grandmother   . Crohn's disease Paternal Grandfather   . Emphysema Brother     smoker  . Heart disease Brother   . Diabetes Brother   . Thyroid cancer Brother   . Coronary artery disease Brother      s/p MI's and 7 stents  . Diabetes Brother   . Other Brother     back disease w/ bulging discs  . Colon polyps Brother     esophageal and tongue polyps  . Diabetes Sister   . Other Sister     short term memory loss  . Aneurysm Sister     brain aneursm s/p coiling  . Diabetes Son   . Anxiety disorder Son     History   Social History  . Marital Status: Married    Spouse Name: N/A    Number of Children: N/A  . Years of Education: N/A   Occupational History  . retired Geologist, engineering    Social History Main Topics  . Smoking status: Never Smoker   . Smokeless tobacco: Never Used  . Alcohol Use: No  . Drug Use: No  . Sexual Activity: Yes    Partners: Male   Other Topics Concern  . Not on file   Social History Narrative  . No narrative on file    Current Outpatient Prescriptions on File Prior to Visit  Medication Sig Dispense Refill  . allopurinol (ZYLOPRIM) 100 MG tablet Take 100 mg by mouth daily.      Marland Kitchen aspirin EC 81 MG tablet Take 81 mg by mouth 2 (two) times daily.      . carvedilol (COREG) 6.25 MG tablet Take 6.25 mg by mouth 2 (two) times daily with a meal. Takes 1/2 of 6.25 pill bid      . Cholecalciferol (VITAMIN D) 2000 UNITS tablet Take 2,000 Units by mouth daily. D 3      . clopidogrel (PLAVIX) 75 MG tablet Take 75 mg by mouth daily before breakfast.      . ezetimibe-simvastatin (VYTORIN) 10-40 MG per tablet Take 1 tablet by mouth at bedtime.      . Flaxseed, Linseed, (FLAX SEEDS PO) Take 1 capsule by mouth 2 (two) times daily. 1200 mg bid      . gabapentin (NEURONTIN) 300 MG capsule Take 300 mg by mouth 2 (two) times daily.      . isosorbide mononitrate (IMDUR) 60 MG 24 hr tablet Take 60 mg by mouth daily before breakfast.      . levothyroxine (SYNTHROID, LEVOTHROID) 25 MCG tablet Take 25 mcg by mouth at bedtime.      . Liraglutide (VICTOZA) 18 MG/3ML SOPN Inject 1.8 mg into the skin daily.      Marland Kitchen losartan (COZAAR) 25 MG tablet Take 25 mg by mouth  daily.      . metformin (FORTAMET)  1000 MG (OSM) 24 hr tablet Take 1,000 mg by mouth daily with breakfast. Takes 2 1000 mg tabs daily      . Milnacipran (SAVELLA) 50 MG TABS tablet Take 50 mg by mouth every morning.      . ondansetron (ZOFRAN-ODT) 8 MG disintegrating tablet Take 8 mg by mouth every 8 (eight) hours as needed for nausea.      . ONE TOUCH ULTRA TEST test strip USE TO CHECK BLOOD SUGAR EVERY MORNING AND AS NEEDED  100 each  2  . pantoprazole (PROTONIX) 40 MG tablet TAKE 1 TABLET EVERY DAY  90 tablet  2  . Probiotic Product (ALIGN) 4 MG CAPS Take 4 mg by mouth daily.      . promethazine (PHENERGAN) 25 MG tablet TAKE 1 TABLET BY MOUTH EVERY 6 HOURS AS NEEDED FOR NAUSEA  30 tablet  0  . ranitidine (ZANTAC) 300 MG capsule Take 300 mg by mouth at bedtime as needed and may repeat dose one time if needed for heartburn.      Marland Kitchen UNABLE TO FIND CPAP THERAPY       No current facility-administered medications on file prior to visit.    Allergies  Allergen Reactions  . Percocet [Oxycodone-Acetaminophen] Itching    Review of Systems  Review of Systems  Constitutional: Positive for fever, chills and malaise/fatigue.  HENT: Negative for congestion.   Eyes: Negative for discharge.  Respiratory: Negative for shortness of breath.   Cardiovascular: Negative for chest pain, palpitations and leg swelling.  Gastrointestinal: Positive for nausea, abdominal pain and diarrhea. Negative for heartburn, vomiting, constipation, blood in stool and melena.  Genitourinary: Negative for dysuria.  Musculoskeletal: Positive for myalgias. Negative for falls.  Skin: Negative for rash.  Neurological: Negative for loss of consciousness and headaches.  Endo/Heme/Allergies: Negative for polydipsia.  Psychiatric/Behavioral: Negative for depression and suicidal ideas. The patient is not nervous/anxious and does not have insomnia.     Objective  BP 102/70  Pulse 94  Temp(Src) 98.2 F (36.8 C) (Oral)  Ht 5'  1" (1.549 m)  Wt 239 lb 1.9 oz (108.464 kg)  BMI 45.2 kg/m2  SpO2 91%  Physical Exam  Physical Exam  Constitutional: She is oriented to person, place, and time and well-developed, well-nourished, and in no distress. No distress.  HENT:  Head: Normocephalic and atraumatic.  Eyes: Conjunctivae are normal.  Neck: Neck supple. No thyromegaly present.  Cardiovascular: Normal rate, regular rhythm and normal heart sounds.   No murmur heard. Pulmonary/Chest: Effort normal and breath sounds normal. She has no wheezes.  Abdominal: Soft. Bowel sounds are normal. She exhibits no distension and no mass. There is tenderness. There is guarding. There is no rebound.  llq and luq pain with palp  Musculoskeletal: She exhibits no edema.  Lymphadenopathy:    She has no cervical adenopathy.  Neurological: She is alert and oriented to person, place, and time.  Skin: Skin is warm and dry. No rash noted. She is not diaphoretic.  Psychiatric: Memory, affect and judgment normal.    Lab Results  Component Value Date   TSH 4.236 11/08/2012   Lab Results  Component Value Date   WBC 9.6 11/08/2012   HGB 12.3 11/08/2012   HCT 36.6 11/08/2012   MCV 89.3 11/08/2012   PLT 325 11/08/2012   Lab Results  Component Value Date   CREATININE 0.80 10/22/2012   BUN 17 10/22/2012   NA 140 10/22/2012   K 4.2 10/22/2012   CL 102 10/22/2012  CO2 28 10/22/2012   Lab Results  Component Value Date   ALT 18 08/27/2012   AST 20 08/27/2012   ALKPHOS 78 08/27/2012   BILITOT 0.3 08/27/2012   Lab Results  Component Value Date   CHOL 153 07/27/2012   Lab Results  Component Value Date   HDL 37* 07/27/2012   Lab Results  Component Value Date   LDLCALC 66 07/27/2012   Lab Results  Component Value Date   TRIG 252* 07/27/2012   Lab Results  Component Value Date   CHOLHDL 4.1 07/27/2012     Assessment & Plan  ESSENTIAL HYPERTENSION, BENIGN Well controlled, no changes   DIVERTICULAR DISEASE Early flare given rx for  Ciprofloxacin and Flagyl, encouraged bowel rest and probiotics, start meds if symptoms worsen  DM Sugars have not flared dramatically with acute symptoms, continue current meds  Constipation Encouraged increased fluids, fiber, and probiotics, consider MOM and prune juice with Dulcolax suppository if no results

## 2013-02-08 NOTE — Assessment & Plan Note (Addendum)
Early flare given rx for Ciprofloxacin and Flagyl, encouraged bowel rest and probiotics, start meds if symptoms worsen

## 2013-02-09 LAB — URINALYSIS
Hgb urine dipstick: NEGATIVE
Leukocytes, UA: NEGATIVE
Protein, ur: NEGATIVE mg/dL
Urobilinogen, UA: 1 mg/dL (ref 0.0–1.0)

## 2013-02-10 ENCOUNTER — Telehealth: Payer: Self-pay | Admitting: Cardiovascular Disease

## 2013-02-10 NOTE — Telephone Encounter (Signed)
NEED TO INVESTIGATE ABOUT CPAP AND NEW DME COMPANY.

## 2013-02-10 NOTE — Telephone Encounter (Signed)
Calling about information for a new cpac machine and also wants to know who is suppose to check the card on the cpac machine .Marland Kitchen  Please call    Thanks

## 2013-02-11 ENCOUNTER — Ambulatory Visit (HOSPITAL_BASED_OUTPATIENT_CLINIC_OR_DEPARTMENT_OTHER)
Admission: RE | Admit: 2013-02-11 | Discharge: 2013-02-11 | Disposition: A | Discharge status: Home / Self Care | Payer: Medicare Other | Source: Ambulatory Visit | Attending: Family Medicine | Admitting: Family Medicine

## 2013-02-11 ENCOUNTER — Telehealth: Payer: Self-pay | Admitting: *Deleted

## 2013-02-11 DIAGNOSIS — K5732 Diverticulitis of large intestine without perforation or abscess without bleeding: Secondary | ICD-10-CM

## 2013-02-11 DIAGNOSIS — R1032 Left lower quadrant pain: Secondary | ICD-10-CM | POA: Insufficient documentation

## 2013-02-11 DIAGNOSIS — R110 Nausea: Secondary | ICD-10-CM | POA: Insufficient documentation

## 2013-02-11 DIAGNOSIS — R509 Fever, unspecified: Secondary | ICD-10-CM | POA: Insufficient documentation

## 2013-02-11 DIAGNOSIS — R197 Diarrhea, unspecified: Secondary | ICD-10-CM | POA: Insufficient documentation

## 2013-02-11 NOTE — Telephone Encounter (Signed)
Spoke with husband informed him that I have sent the referral over to [facility]. They will be contacting her once they have verified her insurance benefits.

## 2013-02-13 ENCOUNTER — Encounter: Payer: Self-pay | Admitting: Family Medicine

## 2013-02-13 DIAGNOSIS — K5900 Constipation, unspecified: Secondary | ICD-10-CM

## 2013-02-13 HISTORY — DX: Constipation, unspecified: K59.00

## 2013-02-13 NOTE — Assessment & Plan Note (Signed)
Encouraged increased fluids, fiber, and probiotics, consider MOM and prune juice with Dulcolax suppository if no results

## 2013-02-13 NOTE — Assessment & Plan Note (Signed)
Sugars have not flared dramatically with acute symptoms, continue current meds

## 2013-02-14 ENCOUNTER — Other Ambulatory Visit: Payer: Self-pay | Admitting: Family Medicine

## 2013-02-14 NOTE — Telephone Encounter (Signed)
Rx request to pharmacy/[facility]  

## 2013-02-15 ENCOUNTER — Other Ambulatory Visit: Payer: Self-pay | Admitting: Family Medicine

## 2013-02-16 ENCOUNTER — Other Ambulatory Visit: Payer: Self-pay | Admitting: Family Medicine

## 2013-02-16 NOTE — Telephone Encounter (Signed)
Rx request Denied; Duplicate request, already sent to pharmacy 10.13.14 & 10.14.14/[facility]S

## 2013-02-26 ENCOUNTER — Other Ambulatory Visit: Payer: Self-pay | Admitting: Family Medicine

## 2013-03-19 ENCOUNTER — Other Ambulatory Visit: Payer: Self-pay | Admitting: Family Medicine

## 2013-03-28 ENCOUNTER — Telehealth: Payer: Self-pay | Admitting: Cardiovascular Disease

## 2013-03-28 NOTE — Telephone Encounter (Signed)
Message forwarded to W. Waddell, CMA.  

## 2013-03-28 NOTE — Telephone Encounter (Signed)
These things are ordered from the sleep lab. I have nothing to do with ordering. The patient will need to call the sleep lab to find out who they referred them to. I would not know this information.

## 2013-03-28 NOTE — Telephone Encounter (Signed)
Please have not received her C-Pac-would like to get the name of the company where it was ordered from. Please have Burna Mortimer to call him.

## 2013-04-05 ENCOUNTER — Other Ambulatory Visit: Payer: Self-pay | Admitting: Family Medicine

## 2013-04-05 DIAGNOSIS — G609 Hereditary and idiopathic neuropathy, unspecified: Secondary | ICD-10-CM

## 2013-04-05 MED ORDER — GABAPENTIN 300 MG PO CAPS: 600.0000 mg | ORAL_CAPSULE | Freq: Two times a day (BID) | ORAL | Status: DC

## 2013-04-11 ENCOUNTER — Other Ambulatory Visit: Payer: Self-pay | Admitting: Family Medicine

## 2013-04-11 NOTE — Telephone Encounter (Signed)
Patient is also out of town so I don't believe she requested this. I will reopen if pt wants when they return call

## 2013-04-11 NOTE — Telephone Encounter (Signed)
Refill on Ranitidine 300mg 

## 2013-04-12 NOTE — Telephone Encounter (Signed)
Amber [name] w/ [facility] calling regarding refill, please contact him @ [phone]

## 2013-04-13 NOTE — Telephone Encounter (Signed)
Cliff informed that we are waiting on pt confirmation if pt is still taking this

## 2013-04-18 ENCOUNTER — Telehealth: Payer: Self-pay | Admitting: *Deleted

## 2013-04-18 NOTE — Telephone Encounter (Signed)
We had to r/s Amber [name]'s appointment and Amber [name] was concerned about her CPAP machine. He had some questions about it and wanted to speak to [facility]

## 2013-04-21 NOTE — Telephone Encounter (Signed)
Tried to return a call and got a message saying "person has a voice mailbox that has not been set up yet" will await a return call from them.

## 2013-04-22 ENCOUNTER — Ambulatory Visit: Payer: Medicare Other | Admitting: Cardiovascular Disease

## 2013-05-11 ENCOUNTER — Ambulatory Visit (INDEPENDENT_AMBULATORY_CARE_PROVIDER_SITE_OTHER): Payer: Medicare Other | Admitting: Cardiovascular Disease

## 2013-05-11 ENCOUNTER — Encounter: Payer: Self-pay | Admitting: Cardiovascular Disease

## 2013-05-11 VITALS — BP 149/83 | HR 99 | Ht 61.0 in | Wt 245.4 lb

## 2013-05-11 DIAGNOSIS — E119 Type 2 diabetes mellitus without complications: Secondary | ICD-10-CM

## 2013-05-11 DIAGNOSIS — G4730 Sleep apnea, unspecified: Secondary | ICD-10-CM

## 2013-05-11 DIAGNOSIS — I2510 Atherosclerotic heart disease of native coronary artery without angina pectoris: Secondary | ICD-10-CM

## 2013-05-11 DIAGNOSIS — I10 Essential (primary) hypertension: Secondary | ICD-10-CM

## 2013-05-11 NOTE — Patient Instructions (Addendum)
Your physician recommends that you schedule a follow-up appointment in: 2 Months sleep clinic

## 2013-05-12 ENCOUNTER — Telehealth: Payer: Self-pay

## 2013-05-12 MED ORDER — ONDANSETRON 8 MG PO TBDP: 8.0000 mg | ORAL_TABLET | Freq: Three times a day (TID) | ORAL | Status: DC | PRN

## 2013-05-12 NOTE — Telephone Encounter (Signed)
Per MD send in Zofran

## 2013-05-17 ENCOUNTER — Other Ambulatory Visit: Payer: Self-pay | Admitting: Cardiovascular Disease

## 2013-05-17 ENCOUNTER — Telehealth: Payer: Self-pay | Admitting: Family Medicine

## 2013-05-17 ENCOUNTER — Ambulatory Visit: Payer: Medicare Other | Admitting: Cardiovascular Disease

## 2013-05-17 MED ORDER — ISOSORBIDE MONONITRATE ER 60 MG PO TB24
60.0000 mg | ORAL_TABLET | Freq: Every day | ORAL | Status: DC
Start: 2013-05-17 — End: 2014-01-05

## 2013-05-17 NOTE — Telephone Encounter (Signed)
refill-isosorbide mn er 60mg  tab. Take one tablet by mouth daily before breakfast. Qty 30 last fill 12.16.14

## 2013-05-17 NOTE — Telephone Encounter (Signed)
Rx was sent to pharmacy electronically. 

## 2013-05-23 ENCOUNTER — Telehealth: Payer: Self-pay

## 2013-05-23 NOTE — Telephone Encounter (Signed)
Patients spouse states that pt cancelled trip to [place]. Pts spouse is typing up a note to bring in for Dr Charlett Blake to sign stating that pt had pneumonia and that's why they couldn't make the trip

## 2013-05-29 ENCOUNTER — Encounter: Payer: Self-pay | Admitting: Cardiovascular Disease

## 2013-05-29 DIAGNOSIS — I10 Essential (primary) hypertension: Secondary | ICD-10-CM | POA: Insufficient documentation

## 2013-05-29 NOTE — Progress Notes (Signed)
Patient ID: Amber [name], female   DOB: 11/08/1945, 68 y.o.   MRN: [id number]     HPI: Amber [name], is a 68 y.o. female known coronary artery disease, hypertension, obesity, diabetes mellitus, and obstructive sleep apnea. She presents for followup evaluation.  Amber [name] has established coronary artery disease and underwent remote interventions to LAD, circumflex and right coronary arteries. Her last intervention was done in April 2009 with a stent was placed at the LAD ostium and proximal segment proximal to a previously placed stent.  Amber [name] was diagnosed with obstructive sleep apnea in 2008. At that time, she has significant daytime sleepiness, snoring and nonrestorative sleep. She has severe sleep apnea with an HIF approximately 50 to per hour and REM sleep this increased to 72 per hour. She also had significant oxygen desaturation with non-REM sleep to 70% and REM sleep to a nadir of 64%. She has been on CPAP therapy since 2008. Recently, she has had difficulty with her machine. Garlic, she had been followed by [facility]. Most recently she was referred to [facility]. I did review a download from her brain 9 2014 through 05/01/2013. She had 93.5% of days with device usage. She was averaging 6 hours and 49 minutes of use per night. She was on a CPAP auto unit with a peak average pressure of 9.7 and average device pressure greater than 90% of the time at 8.5 cm of water. On this pressure her average AHI was 4.0.  The last several months, her machine has been intermittently nonfunctioning. Her initial CPAP unit is now almost 70 years old. She is in need for numerous sheen. Presently, she goes to bed at 11 PM and wakes up at approximately 9:30 in the morning. She is unaware of bruxism. She is unaware of restless legs. She is unaware of breakthrough snoring when she has been able to use her unit. She denies significant residual daytime sleepiness  presently.   Epworth Sleepiness Scale: Situation   Chance of Dozing/Sleeping (0 = never , 1 = slight chance , 2 = moderate chance , 3 = high chance )   sitting and reading 0   watching TV 3   sitting inactive in a public place 0   being a passenger in a motor vehicle for an hour or more 0   lying down in the afternoon 3   sitting and talking to someone 0   sitting quietly after lunch (no alcohol) 0   while stopped for a few minutes in traffic as the driver 0   Total Score  6    Past Medical History  Diagnosis Date  . Thyroid disease   . Hyperlipidemia   . Diabetes mellitus     type 2  . Hypoxia 10/07/2010  . Skin lesion of back 10/18/2010  . Viral infection characterized by skin and mucous membrane lesions 10/18/2010  . Vitamin D deficiency 11/21/2010  . Back pain 11/21/2010  . Motion sickness 12/04/2010  . UTI (lower urinary tract infection) 09/08/2011  . Hot flashes 10/06/2011  . Bronchitis 02/28/2012  . Tubular adenoma of colon 2011, 2014  . Hypothyroidism   . Depression   . GERD (gastroesophageal reflux disease)   . Arthritis   . Leukocytosis, unspecified 10/24/2012  . Hypertension 11/13/10    ECHO- EF >55%-Left Ventricular systolic normal.Mild mitral regurgitation. Mild tricupsid regurgitation. There is a small posterior pericardial perfusion. No hemodynamic compromise.   . Coronary artery disease 11/13/10  Nuclear stress test-normal low risk scan. EF 63%.  . 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 Cataract 10/08/2010  . Diverticulitis   . Heart murmur     MVP  . Renal lithiasis 08/28/2012  . Fibromyalgia   . Complication of anesthesia     woke up while tube still inserted 1 shoulder surgery in [place]  . Myocardial infarction   . Sleep apnea 06/01/06    sleep study [facility]- 279 arousals with index of 52.0/hr for total sleep time.Marland Kitchen AHI 51.99/hr during total sleep. 72.00/hr during REM sleep highest heart rate awake was 92 beats/ min lowest awake was 72.  Highest heartrate while asleep was 92 and the lowest was 61.  . On supplemental oxygen therapy     uses 2 1/2 liter oxygen per mask at hs  . Constipation 02/13/2013    Past Surgical History  Procedure Laterality Date  . Angioplasty  1980  . Angioplasty  2006    3 stents  . Angioplasty  2009    2 stents  . Thyroidectomy  1950    for goiter  . Diverticulitis  1978    exploratory  . Right rotator cuff repair  2004  . Left rotator cuff repair  2004  . Bone fusion left foot  2010    7 screws in foot, bunionectomy  . Hip surgery  2008    large bone spur removed, right  . Colonoscopy N/A 07/26/2012    Procedure: COLONOSCOPY;  Surgeon: Lafayette Dragon, MD;  Location: [facility];  Service: Endoscopy;  Laterality: N/A;  . Stent placement  2013    x 3  . Back surgery  2004    lumbar discectomy for bulging disc  . Abdominal hysterectomy  1977    partial  . Cholecystectomy  1970's    Allergies  Allergen Reactions  . Percocet [Oxycodone-Acetaminophen] Itching    Current Outpatient Prescriptions  Medication Sig Dispense Refill  . allopurinol (ZYLOPRIM) 100 MG tablet TAKE 1 TABLET EVERY DAY  30 tablet  2  . aspirin EC 81 MG tablet Take 81 mg by mouth 2 (two) times daily.      . carvedilol (COREG) 6.25 MG tablet Take 6.25 mg by mouth 2 (two) times daily with a meal. Takes  1/2 of 6.25 pill bid      . Cholecalciferol (VITAMIN D) 2000 UNITS tablet Take 2,000 Units by mouth daily. D 3      . clopidogrel (PLAVIX) 75 MG tablet Take 75 mg by mouth daily before breakfast.      . escitalopram (LEXAPRO) 20 MG tablet TAKE 2 TABLETS (40 MG TOTAL) BY MOUTH DAILY.  60 tablet  5  . ezetimibe-simvastatin (VYTORIN) 10-40 MG per tablet Take 1 tablet by mouth at bedtime.      . Flaxseed, Linseed, (FLAX SEEDS PO) Take 1 capsule by mouth 2 (two) times daily. 1200 mg bid      . gabapentin (NEURONTIN) 300 MG capsule Take 2 capsules (600 mg total) by mouth 2 (two) times daily.  120 capsule  5  . Liraglutide  (VICTOZA) 18 MG/3ML SOPN Inject 1.8 mg into the skin daily.      . metFORMIN (GLUCOPHAGE) 1000 MG tablet TAKE 1 TABLET (1,000 MG TOTAL) BY MOUTH 2 (TWO) TIMES DAILY WITH A MEAL.  60 tablet  2  . Milnacipran (SAVELLA) 50 MG TABS tablet Take 50 mg by mouth every morning.      . ONE TOUCH ULTRA  TEST test strip USE TO CHECK BLOOD SUGAR EVERY MORNING AND AS NEEDED  100 each  2  . pantoprazole (PROTONIX) 40 MG tablet TAKE 1 TABLET EVERY DAY  90 tablet  2  . Probiotic Product (ALIGN) 4 MG CAPS Take 4 mg by mouth daily.      Marland Kitchen UNABLE TO FIND CPAP THERAPY      . isosorbide mononitrate (IMDUR) 60 MG 24 hr tablet Take 1 tablet (60 mg total) by mouth daily before breakfast.  30 tablet  4  . losartan (COZAAR) 25 MG tablet TAKE 1 TABLET BY MOUTH EVERY DAY  30 tablet  7  . ondansetron (ZOFRAN-ODT) 8 MG disintegrating tablet Take 1 tablet (8 mg total) by mouth every 8 (eight) hours as needed for nausea.  20 tablet  1   No current facility-administered medications for this visit.    Social history is notable in that she is married, has 3 children and 5 grandchildren. She has lost weight. No tobacco or alcohol use  ROS negative for fever, chills or night sweats. Has history of cataracts. She denies change in hearing. She denies cough or increased sputum production. Her previous shortness of breath has improved. She denies anginal symptoms. She does have remote history of kidney stones. There is a history of hypertension. She does have diabetes mellitus. She does have GERD. She denies nausea or vomiting. She is unaware of blood in her stool or urine. At times there is occasional leg swelling. She is diabetic. There also is a history of hypothyroidism. She also has history of neuropathy. Is also history of fibromyalgia. Sleep history as noted above. Other comprehensive 14 point system review is negative.  PE BP 149/83  Pulse 99  Ht _0  (1.549 m)  Wt 245 lb 6.4 oz (111.313 kg)  BMI 46.39 kg/m2  Weight has  improved from 261-245. General: Alert, oriented, no distress.  Skin: normal turgor, no rashes HEENT: Normocephalic, atraumatic. Pupils round and reactive; sclera anicteric; extraocular muscles intact; Fundi no hemorrhages or exudates. Nose without nasal septal hypertrophy Mouth/Parynx benign; Mallinpatti scale 3 Neck: No JVD, no carotid briuts Lungs: clear to ausculatation and percussion; no wheezing or rales  Chest wall: No tenderness to palpation Heart: RRR, s1 s2 normal 1/6 systolic murmur at the no S3 gallop. Abdomen: soft, nontender; no hepatosplenomehaly, BS+; abdominal aorta nontender and not dilated by palpation. Back: No CVA tenderness Pulses 2+ Extremities: Trivial ankle edema,no clubbinbg cyanosis, Homan's sign negative  Neurologic: grossly nonfocal; cranial nerves intact. Psychological: Normal affect and mood.   LABS:  BMET    Component Value Date/Time   NA 141 02/08/2013 1227   K 4.2 02/08/2013 1227   CL 102 02/08/2013 1227   CO2 33* 02/08/2013 1227   GLUCOSE 130* 02/08/2013 1227   BUN 18 02/08/2013 1227   CREATININE 0.86 02/08/2013 1227   CREATININE 0.7 08/27/2012 1523   CALCIUM 9.8 02/08/2013 1227   GFRNONAA >60 09/25/2009 1010   GFRAA  Value: >60        The eGFR has been calculated using the MDRD equation. This calculation has not been validated in all clinical situations. eGFR's persistently <60 mL/min signify possible Chronic Kidney Disease. 09/25/2009 1010     Hepatic Function Panel     Component Value Date/Time   PROT 6.6 02/08/2013 1227   ALBUMIN 4.0 02/08/2013 1227   ALBUMIN 4.0 02/08/2013 1227   AST 17 02/08/2013 1227   ALT 15 02/08/2013 1227   ALKPHOS 90 02/08/2013 1227  BILITOT 0.3 02/08/2013 1227   BILIDIR 0.1 02/08/2013 1227   IBILI 0.2 02/08/2013 1227     CBC    Component Value Date/Time   WBC 9.5 02/08/2013 1227   RBC 4.44 02/08/2013 1227   HGB 13.6 02/08/2013 1227   HCT 41.0 02/08/2013 1227   PLT 297 02/08/2013 1227   MCV 92.3 02/08/2013 1227   MCH  30.6 02/08/2013 1227   MCHC 33.2 02/08/2013 1227   RDW 13.9 02/08/2013 1227   LYMPHSABS 2.9 12/27/2010 1526   MONOABS 0.8 12/27/2010 1526   EOSABS 0.2 12/27/2010 1526   BASOSABS 0.0 12/27/2010 1526     BNP    Component Value Date/Time   PROBNP <30.0 04/23/2007 1740    Lipid Panel     Component Value Date/Time   CHOL 153 07/27/2012 1007   TRIG 252* 07/27/2012 1007   HDL 37* 07/27/2012 1007   CHOLHDL 4.1 07/27/2012 1007   VLDL 50* 07/27/2012 1007   LDLCALC 66 07/27/2012 1007     RADIOLOGY: No results found.    ASSESSMENT AND PLAN:  Ms Raiyah Speakman has established coronary artery disease, hypertension, morbid obesity, type 2 diabetes mellitus, GERD, hyperlipidemia, as well as obstructive sleep apnea. Presently on her current medical regimen, her blood pressure is controlled. She has lost approximately 16 pounds since her last office visit. She has documented severe obstructive sleep apnea and on her initial diagnostic study had severe nocturnal oxygen desaturation. She has been on CPAP therapy for 7 years and has been documented to have sleep compliance and benefit. Her machine is now old and is having intermittent difficulties in function. She is a candidate for a new machine and I recommended that this be undertaken. She is well aware of the positive benefits of treated sleep apnea with reference to her coronary artery disease and cardiovascular comorbidities. We also discussed additional for the weight loss if possible. Presently, she is not having significant residual daytime sleepiness. Her LDL cholesterol last year was excellent, however, triglycerides were elevated at 252 with an HDL of 37. Se is on Vytorin 10/40 and I discussed with her the most recent data regarding the Improve-it trial.  She will continue on dual anti-platelet therapy for cardiac obstructive disease. She's not having bleeding issues. She has noticed more energy with her weight loss. After she gets her her new CPAP  machine, I will then see her in sleep clinic following a download for subsequent evaluation.     Troy Sine, MD, [facility]  05/29/2013 9:52 AM

## 2013-06-08 ENCOUNTER — Telehealth: Payer: Self-pay

## 2013-06-08 NOTE — Telephone Encounter (Signed)
Patients spouse dropped off a letter that he would like MD to read and retype for spouses trip to be reimbursed.  Verbal per MD ok to create letter.  Letter at front desk and message left on patients vm

## 2013-06-09 ENCOUNTER — Telehealth: Payer: Self-pay | Admitting: Family Medicine

## 2013-06-09 MED ORDER — LIRAGLUTIDE 18 MG/3ML ~~LOC~~ SOPN: 1.8000 mg | PEN_INJECTOR | Freq: Every day | SUBCUTANEOUS | Status: DC

## 2013-06-09 NOTE — Telephone Encounter (Signed)
Refill-victoza

## 2013-06-14 ENCOUNTER — Telehealth: Payer: Self-pay | Admitting: *Deleted

## 2013-06-14 NOTE — Telephone Encounter (Signed)
Returned CPAP supply order. 

## 2013-06-16 ENCOUNTER — Other Ambulatory Visit: Payer: Self-pay

## 2013-06-16 DIAGNOSIS — Z1231 Encounter for screening mammogram for malignant neoplasm of breast: Secondary | ICD-10-CM

## 2013-06-21 ENCOUNTER — Encounter (HOSPITAL_BASED_OUTPATIENT_CLINIC_OR_DEPARTMENT_OTHER): Payer: Self-pay | Admitting: Emergency Medicine

## 2013-06-21 ENCOUNTER — Emergency Department (HOSPITAL_BASED_OUTPATIENT_CLINIC_OR_DEPARTMENT_OTHER): Payer: Medicare Other

## 2013-06-21 ENCOUNTER — Emergency Department (HOSPITAL_BASED_OUTPATIENT_CLINIC_OR_DEPARTMENT_OTHER)
Admission: EM | Admit: 2013-06-21 | Discharge: 2013-06-21 | Disposition: A | Discharge status: Home / Self Care | Payer: Medicare Other | Attending: Emergency Medicine | Admitting: Emergency Medicine

## 2013-06-21 DIAGNOSIS — H269 Unspecified cataract: Secondary | ICD-10-CM | POA: Insufficient documentation

## 2013-06-21 DIAGNOSIS — Z872 Personal history of diseases of the skin and subcutaneous tissue: Secondary | ICD-10-CM | POA: Insufficient documentation

## 2013-06-21 DIAGNOSIS — J159 Unspecified bacterial pneumonia: Secondary | ICD-10-CM | POA: Insufficient documentation

## 2013-06-21 DIAGNOSIS — E559 Vitamin D deficiency, unspecified: Secondary | ICD-10-CM | POA: Insufficient documentation

## 2013-06-21 DIAGNOSIS — Z7982 Long term (current) use of aspirin: Secondary | ICD-10-CM | POA: Insufficient documentation

## 2013-06-21 DIAGNOSIS — J189 Pneumonia, unspecified organism: Secondary | ICD-10-CM

## 2013-06-21 DIAGNOSIS — Z7902 Long term (current) use of antithrombotics/antiplatelets: Secondary | ICD-10-CM | POA: Insufficient documentation

## 2013-06-21 DIAGNOSIS — Z79899 Other long term (current) drug therapy: Secondary | ICD-10-CM | POA: Insufficient documentation

## 2013-06-21 DIAGNOSIS — Z87440 Personal history of urinary (tract) infections: Secondary | ICD-10-CM | POA: Insufficient documentation

## 2013-06-21 DIAGNOSIS — F3289 Other specified depressive episodes: Secondary | ICD-10-CM | POA: Insufficient documentation

## 2013-06-21 DIAGNOSIS — M129 Arthropathy, unspecified: Secondary | ICD-10-CM | POA: Insufficient documentation

## 2013-06-21 DIAGNOSIS — Z862 Personal history of diseases of the blood and blood-forming organs and certain disorders involving the immune mechanism: Secondary | ICD-10-CM | POA: Insufficient documentation

## 2013-06-21 DIAGNOSIS — I2510 Atherosclerotic heart disease of native coronary artery without angina pectoris: Secondary | ICD-10-CM | POA: Insufficient documentation

## 2013-06-21 DIAGNOSIS — Z87442 Personal history of urinary calculi: Secondary | ICD-10-CM | POA: Insufficient documentation

## 2013-06-21 DIAGNOSIS — Z8619 Personal history of other infectious and parasitic diseases: Secondary | ICD-10-CM | POA: Insufficient documentation

## 2013-06-21 DIAGNOSIS — G4730 Sleep apnea, unspecified: Secondary | ICD-10-CM | POA: Insufficient documentation

## 2013-06-21 DIAGNOSIS — R112 Nausea with vomiting, unspecified: Secondary | ICD-10-CM | POA: Insufficient documentation

## 2013-06-21 DIAGNOSIS — K219 Gastro-esophageal reflux disease without esophagitis: Secondary | ICD-10-CM | POA: Insufficient documentation

## 2013-06-21 DIAGNOSIS — Z9981 Dependence on supplemental oxygen: Secondary | ICD-10-CM | POA: Insufficient documentation

## 2013-06-21 DIAGNOSIS — Z792 Long term (current) use of antibiotics: Secondary | ICD-10-CM | POA: Insufficient documentation

## 2013-06-21 DIAGNOSIS — I10 Essential (primary) hypertension: Secondary | ICD-10-CM | POA: Insufficient documentation

## 2013-06-21 DIAGNOSIS — Z8739 Personal history of other diseases of the musculoskeletal system and connective tissue: Secondary | ICD-10-CM | POA: Insufficient documentation

## 2013-06-21 DIAGNOSIS — R011 Cardiac murmur, unspecified: Secondary | ICD-10-CM | POA: Insufficient documentation

## 2013-06-21 DIAGNOSIS — I252 Old myocardial infarction: Secondary | ICD-10-CM | POA: Insufficient documentation

## 2013-06-21 DIAGNOSIS — E119 Type 2 diabetes mellitus without complications: Secondary | ICD-10-CM | POA: Insufficient documentation

## 2013-06-21 DIAGNOSIS — Z9861 Coronary angioplasty status: Secondary | ICD-10-CM | POA: Insufficient documentation

## 2013-06-21 DIAGNOSIS — F329 Major depressive disorder, single episode, unspecified: Secondary | ICD-10-CM | POA: Insufficient documentation

## 2013-06-21 LAB — COMPREHENSIVE METABOLIC PANEL
ALK PHOS: 94 U/L (ref 39–117)
ALT: 29 U/L (ref 0–35)
AST: 31 U/L (ref 0–37)
Albumin: 3.4 g/dL — ABNORMAL LOW (ref 3.5–5.2)
BUN: 30 mg/dL — ABNORMAL HIGH (ref 6–23)
CO2: 25 meq/L (ref 19–32)
Calcium: 8.6 mg/dL (ref 8.4–10.5)
Chloride: 96 mEq/L (ref 96–112)
Creatinine, Ser: 1.4 mg/dL — ABNORMAL HIGH (ref 0.50–1.10)
GFR calc Af Amer: 43 mL/min — ABNORMAL LOW (ref >90)
GFR, EST NON AFRICAN AMERICAN: 37 mL/min — AB (ref >90)
GLUCOSE: 128 mg/dL — AB (ref 70–99)
POTASSIUM: 3.3 meq/L — AB (ref 3.7–5.3)
SODIUM: 135 meq/L — AB (ref 137–147)
TOTAL PROTEIN: 7 g/dL (ref 6.0–8.3)
Total Bilirubin: 0.3 mg/dL (ref 0.3–1.2)

## 2013-06-21 LAB — URINALYSIS, ROUTINE W REFLEX MICROSCOPIC
Glucose, UA: NEGATIVE mg/dL
Hgb urine dipstick: NEGATIVE
KETONES UR: NEGATIVE mg/dL
LEUKOCYTES UA: NEGATIVE
NITRITE: NEGATIVE
PROTEIN: 30 mg/dL — AB
Specific Gravity, Urine: 1.022 (ref 1.005–1.030)
UROBILINOGEN UA: 1 mg/dL (ref 0.0–1.0)
pH: 6 (ref 5.0–8.0)

## 2013-06-21 LAB — CBC WITH DIFFERENTIAL/PLATELET
BASOS ABS: 0 10*3/uL (ref 0.0–0.1)
BASOS PCT: 0 % (ref 0–1)
EOS ABS: 0.1 10*3/uL (ref 0.0–0.7)
EOS PCT: 1 % (ref 0–5)
HEMATOCRIT: 40.6 % (ref 36.0–46.0)
Hemoglobin: 13.4 g/dL (ref 12.0–15.0)
Lymphocytes Relative: 16 % (ref 12–46)
Lymphs Abs: 1.5 10*3/uL (ref 0.7–4.0)
MCH: 30.2 pg (ref 26.0–34.0)
MCHC: 33 g/dL (ref 30.0–36.0)
MCV: 91.6 fL (ref 78.0–100.0)
MONO ABS: 1 10*3/uL (ref 0.1–1.0)
Monocytes Relative: 11 % (ref 3–12)
Neutro Abs: 6.8 10*3/uL (ref 1.7–7.7)
Neutrophils Relative %: 72 % (ref 43–77)
Platelets: 217 10*3/uL (ref 150–400)
RBC: 4.43 MIL/uL (ref 3.87–5.11)
RDW: 14.9 % (ref 11.5–15.5)
WBC: 9.5 10*3/uL (ref 4.0–10.5)

## 2013-06-21 LAB — URINE MICROSCOPIC-ADD ON

## 2013-06-21 MED ORDER — DEXTROSE 5 % IV SOLN
1.0000 g | Freq: Once | INTRAVENOUS | Status: AC
  Administered 2013-06-21: 1 g via INTRAVENOUS

## 2013-06-21 MED ORDER — AZITHROMYCIN 250 MG PO TABS: 250.0000 mg | ORAL_TABLET | Freq: Every day | ORAL | Status: AC

## 2013-06-21 MED ORDER — ONDANSETRON HCL 4 MG/2ML IJ SOLN
4.0000 mg | Freq: Once | INTRAMUSCULAR | Status: AC
  Administered 2013-06-21: 4 mg via INTRAVENOUS
  Filled 2013-06-21: qty 2

## 2013-06-21 MED ORDER — CEFTRIAXONE SODIUM 1 G IJ SOLR
INTRAMUSCULAR | Status: AC
  Filled 2013-06-21: qty 10

## 2013-06-21 MED ORDER — AZITHROMYCIN 250 MG PO TABS
500.0000 mg | ORAL_TABLET | Freq: Once | ORAL | Status: AC
  Administered 2013-06-21: 500 mg via ORAL
  Filled 2013-06-21: qty 2

## 2013-06-21 MED ORDER — SODIUM CHLORIDE 0.9 % IV BOLUS (SEPSIS)
1000.0000 mL | Freq: Once | INTRAVENOUS | Status: AC
  Administered 2013-06-21: 1000 mL via INTRAVENOUS

## 2013-06-21 MED ORDER — HYDROMORPHONE HCL PF 1 MG/ML IJ SOLN
0.5000 mg | Freq: Once | INTRAMUSCULAR | Status: AC
  Administered 2013-06-21: 17:00:00 via INTRAVENOUS
  Filled 2013-06-21: qty 1

## 2013-06-21 NOTE — ED Notes (Signed)
Pt questioning dispo status, will follow up with MD regarding discharge.

## 2013-06-21 NOTE — ED Notes (Signed)
Pt. Being cared for by Charge RN [place] at this time.  RN Rosana Hoes in to speak with Pt. And let them know I was still here and looking after any needs to be met for them.  Pt. And family member are not requesting any thing at present time.

## 2013-06-21 NOTE — ED Notes (Signed)
Pt and spouse requesting info regarding diagnosis and tx plan, Dr Jeanell Sparrow made aware that they were requesting to see her and speak to her. NAD noted.

## 2013-06-21 NOTE — ED Provider Notes (Signed)
CSN: [id number]     Arrival date & time 06/21/13  1533 History   First MD Initiated Contact with Patient 06/21/13 1600     Chief Complaint  Patient presents with  . Weakness     (Consider location/radiation/quality/duration/timing/severity/associated sxs/prior Treatment) HPI 68 year old female who has had generalized weakness and cough for 2 days. Sunday morning she began having some nausea and vomited multiple times throughout the day. And had a grandchild in the household who has had a similar illness. She also had some loose stools on Sunday. She has not vomited since Sunday but has not been eating as well as usual. She has drank some fluids today her blood sugars have been slightly elevated in the 100s. Past Medical History  Diagnosis Date  . Thyroid disease   . Hyperlipidemia   . Diabetes mellitus     type 2  . Hypoxia 10/07/2010  . Skin lesion of back 10/18/2010  . Viral infection characterized by skin and mucous membrane lesions 10/18/2010  . Vitamin D deficiency 11/21/2010  . Back pain 11/21/2010  . Motion sickness 12/04/2010  . UTI (lower urinary tract infection) 09/08/2011  . Hot flashes 10/06/2011  . Bronchitis 02/28/2012  . Tubular adenoma of colon 2011, 2014  . Hypothyroidism   . Depression   . GERD (gastroesophageal reflux disease)   . Arthritis   . Leukocytosis, unspecified 10/24/2012  . Hypertension 11/13/10    ECHO- EF >55%-Left Ventricular systolic normal.Mild mitral regurgitation. Mild tricupsid regurgitation. There is a small posterior pericardial perfusion. No hemodynamic compromise.   . Coronary artery disease 11/13/10    Nuclear stress test-normal low risk scan. EF 63%.  . 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 Cataract 10/08/2010  . Diverticulitis   . Heart murmur     MVP  . Renal lithiasis 08/28/2012  . Fibromyalgia   . Complication of anesthesia     woke up while tube still inserted 1 shoulder surgery in [place]  . Myocardial infarction   . Sleep apnea  06/01/06    sleep study [facility]- 279 arousals with index of 52.0/hr for total sleep time.Marland Kitchen AHI 51.99/hr during total sleep. 72.00/hr during REM sleep highest heart rate awake was 92 beats/ min lowest awake was 72. Highest heartrate while asleep was 92 and the lowest was 61.  . On supplemental oxygen therapy     uses 2 1/2 liter oxygen per mask at hs  . Constipation 02/13/2013   Past Surgical History  Procedure Laterality Date  . Angioplasty  1980  . Angioplasty  2006    3 stents  . Angioplasty  2009    2 stents  . Thyroidectomy  1950    for goiter  . Diverticulitis  1978    exploratory  . Right rotator cuff repair  2004  . Left rotator cuff repair  2004  . Bone fusion left foot  2010    7 screws in foot, bunionectomy  . Hip surgery  2008    large bone spur removed, right  . Colonoscopy N/A 07/26/2012    Procedure: COLONOSCOPY;  Surgeon: Lafayette Dragon, MD;  Location: [facility];  Service: Endoscopy;  Laterality: N/A;  . Stent placement  2013    x 3  . Back surgery  2004    lumbar discectomy for bulging disc  . Abdominal hysterectomy  1977    partial  . Cholecystectomy  1970's   Family History  Problem Relation Age of Onset  .  Heart disease Father   . Diabetes Sister   . Hypertension Sister   . Depression Sister   . Colon cancer Sister     s/p colectomy  . Emphysema Brother     smoker  . Diabetes Daughter   . Hypertension Daughter   . Depression Daughter   . Diverticulitis Daughter   . Other Daughter     Trichotrilomania  . Aneurysm Maternal Grandmother     brain aneurysm  . Diabetes Paternal Grandmother   . Hypertension Paternal Grandmother   . Colon cancer Paternal Grandmother   . Crohn's disease Paternal Grandfather   . Emphysema Brother     smoker  . Heart disease Brother   . Diabetes Brother   . Thyroid cancer Brother   . Coronary artery disease Brother     s/p MI's and 7 stents  . Diabetes Brother   . Other Brother     back  disease w/ bulging discs  . Colon polyps Brother     esophageal and tongue polyps  . Diabetes Sister   . Other Sister     short term memory loss  . Aneurysm Sister     brain aneursm s/p coiling  . Diabetes Son   . Anxiety disorder Son    History  Substance Use Topics  . Smoking status: Never Smoker   . Smokeless tobacco: Never Used  . Alcohol Use: No   OB History   Grav Para Term Preterm Abortions TAB SAB Ect Mult Living                 Review of Systems  All other systems reviewed and are negative.      Allergies  Percocet  Home Medications   Current Outpatient Rx  Name  Route  Sig  Dispense  Refill  . allopurinol (ZYLOPRIM) 100 MG tablet      TAKE 1 TABLET EVERY DAY   30 tablet   2   . aspirin EC 81 MG tablet   Oral   Take 81 mg by mouth 2 (two) times daily.         Marland Kitchen azithromycin (ZITHROMAX Z-PAK) 250 MG tablet   Oral   Take 1 tablet (250 mg total) by mouth daily.   4 tablet   0   . carvedilol (COREG) 6.25 MG tablet   Oral   Take 6.25 mg by mouth 2 (two) times daily with a meal. Takes  1/2 of 6.25 pill bid         . Cholecalciferol (VITAMIN D) 2000 UNITS tablet   Oral   Take 2,000 Units by mouth daily. D 3         . clopidogrel (PLAVIX) 75 MG tablet   Oral   Take 75 mg by mouth daily before breakfast.         . escitalopram (LEXAPRO) 20 MG tablet      TAKE 2 TABLETS (40 MG TOTAL) BY MOUTH DAILY.   60 tablet   5   . ezetimibe-simvastatin (VYTORIN) 10-40 MG per tablet   Oral   Take 1 tablet by mouth at bedtime.         . Flaxseed, Linseed, (FLAX SEEDS PO)   Oral   Take 1 capsule by mouth 2 (two) times daily. 1200 mg bid         . gabapentin (NEURONTIN) 300 MG capsule   Oral   Take 2 capsules (600 mg total) by mouth 2 (two) times daily.  120 capsule   5   . isosorbide mononitrate (IMDUR) 60 MG 24 hr tablet   Oral   Take 1 tablet (60 mg total) by mouth daily before breakfast.   30 tablet   4   . Liraglutide  (VICTOZA) 18 MG/3ML SOPN   Subcutaneous   Inject 1.8 mg into the skin daily.   3 mL   4   . losartan (COZAAR) 25 MG tablet      TAKE 1 TABLET BY MOUTH EVERY DAY   30 tablet   7   . metFORMIN (GLUCOPHAGE) 1000 MG tablet      TAKE 1 TABLET (1,000 MG TOTAL) BY MOUTH 2 (TWO) TIMES DAILY WITH A MEAL.   60 tablet   2   . Milnacipran (SAVELLA) 50 MG TABS tablet   Oral   Take 50 mg by mouth every morning.         . ondansetron (ZOFRAN-ODT) 8 MG disintegrating tablet   Oral   Take 1 tablet (8 mg total) by mouth every 8 (eight) hours as needed for nausea.   20 tablet   1   . ONE TOUCH ULTRA TEST test strip      USE TO CHECK BLOOD SUGAR EVERY MORNING AND AS NEEDED   100 each   2   . pantoprazole (PROTONIX) 40 MG tablet      TAKE 1 TABLET EVERY DAY   90 tablet   2   . Probiotic Product (ALIGN) 4 MG CAPS   Oral   Take 4 mg by mouth daily.         Marland Kitchen UNABLE TO FIND      CPAP THERAPY          BP 101/58  Pulse 98  Temp(Src) 98.4 F (36.9 C) (Oral)  Resp 18  Wt 245 lb (111.131 kg)  SpO2 91% Physical Exam  Nursing note and vitals reviewed. Constitutional: She is oriented to person, place, and time. She appears well-developed and well-nourished.  Morbidly obese  HENT:  Head: Normocephalic and atraumatic.  Right Ear: External ear normal.  Left Ear: External ear normal.  Nose: Nose normal.  Mouth/Throat: Oropharynx is clear and moist.  Eyes: Conjunctivae and EOM are normal. Pupils are equal, round, and reactive to light.  Neck: Normal range of motion. Neck supple.  Cardiovascular: Normal rate, regular rhythm, normal heart sounds and intact distal pulses.   Pulmonary/Chest: Effort normal and breath sounds normal.  Abdominal: Soft. Bowel sounds are normal.  Musculoskeletal: Normal range of motion.  Neurological: She is alert and oriented to person, place, and time. She has normal reflexes. She displays normal reflexes. No cranial nerve deficit. She exhibits  normal muscle tone. Coordination normal.  Skin: Skin is warm and dry.  Psychiatric: She has a normal mood and affect. Her behavior is normal. Judgment and thought content normal.    ED Course  Procedures (including critical care time) Labs Review Labs Reviewed  COMPREHENSIVE METABOLIC PANEL - Abnormal; Notable for the following:    Sodium 135 (*)    Potassium 3.3 (*)    Glucose, Bld 128 (*)    BUN 30 (*)    Creatinine, Ser 1.40 (*)    Albumin 3.4 (*)    GFR calc non Af Amer 37 (*)    GFR calc Af Amer 43 (*)    All other components within normal limits  URINALYSIS, ROUTINE W REFLEX MICROSCOPIC - Abnormal; Notable for the following:    APPearance CLOUDY (*)  Bilirubin Urine SMALL (*)    Protein, ur 30 (*)    All other components within normal limits  URINE MICROSCOPIC-ADD ON - Abnormal; Notable for the following:    Bacteria, UA MANY (*)    Casts GRANULAR CAST (*)    All other components within normal limits  CBC WITH DIFFERENTIAL   Imaging Review Dg Chest 2 View  06/21/2013   CLINICAL DATA:  Chest pain.  EXAM: CHEST  2 VIEW  COMPARISON:  June 14, 2012.  FINDINGS: Stable cardiomediastinal silhouette. Left lung is clear. Opacity is noted in the medial portion of the right middle lobe concerning for pneumonia or atelectasis. No pneumothorax or pleural effusion is noted. Surgical screw is seen in right proximal humeral head.  IMPRESSION: Medial right middle lobe opacity is noted concerning for pneumonia or atelectasis.   Electronically Signed   By: Sabino Dick M.D.   On: 06/21/2013 17:01  I have reviewed the report and personally reviewed the above radiology studies.    EKG Interpretation    Date/Time:  Tuesday June 21 2013 16:07:51 EST Ventricular Rate:  105 PR Interval:  138 QRS Duration: 78 QT Interval:  338 QTC Calculation: 446 R Axis:   -5 Text Interpretation:  Sinus tachycardia Low voltage QRS Borderline ECG Confirmed by Ethylene Reznick MD, Aiyannah Fayad ([id number]) on 06/21/2013  9:01:47 PM            MDM   Final diagnoses:  CAP (community acquired pneumonia)    Patient with multiple health problems who presents with weakness, vomiting, and cough. She has not vomited since Sunday. She is rehydrated here with IV fluids. She received Rocephin and Zithromax will be discharged home on Zithromax. She feels improved after the IV fluids. I discussed the above with the patient and her husband and they both voice understanding and understanding of need for followup and return precautions.   Shaune Pollack, MD 06/21/13 2103

## 2013-06-21 NOTE — ED Notes (Signed)
Sunday vomiting and diarrhea. Chills. Lower abdominal pain. Hx of diverticulitis. Husband has been giving her antidiarrheal medication. No much appetite. Left lower quad pain and right scapula today. Weakness.

## 2013-06-21 NOTE — ED Notes (Signed)
SL removed from left A/C per protocol, pt tolerated well.

## 2013-06-21 NOTE — Discharge Instructions (Signed)

## 2013-06-30 ENCOUNTER — Ambulatory Visit: Payer: Medicare Other | Admitting: Physician Assistant

## 2013-07-04 ENCOUNTER — Ambulatory Visit (INDEPENDENT_AMBULATORY_CARE_PROVIDER_SITE_OTHER): Payer: Medicare Other | Admitting: Physician Assistant

## 2013-07-04 ENCOUNTER — Encounter: Payer: Self-pay | Admitting: Physician Assistant

## 2013-07-04 VITALS — BP 142/84 | HR 99 | Temp 98.4°F | Resp 16 | Ht 61.0 in | Wt 237.4 lb

## 2013-07-04 DIAGNOSIS — K219 Gastro-esophageal reflux disease without esophagitis: Secondary | ICD-10-CM | POA: Insufficient documentation

## 2013-07-04 DIAGNOSIS — J189 Pneumonia, unspecified organism: Secondary | ICD-10-CM | POA: Insufficient documentation

## 2013-07-04 DIAGNOSIS — I2510 Atherosclerotic heart disease of native coronary artery without angina pectoris: Secondary | ICD-10-CM

## 2013-07-04 NOTE — Progress Notes (Signed)
Pre visit review using our clinic review tool, if applicable. No additional management support is needed unless otherwise documented below in the visit note/SLS  

## 2013-07-04 NOTE — Assessment & Plan Note (Signed)
Resolved.  Patient with residual mild fatigue that is improving daily.  Continue good fluid intake and continue activity as tolerated. Energy should continue to improve.  Return to clinic for recurrence of symptoms.

## 2013-07-04 NOTE — Assessment & Plan Note (Signed)
Continue protonix daily.  Restart Probiotic daily.  I feel this combination will reduce bloating/belching while at the same time prevent GERD symptoms.  Avoid trigger foods and late-night eating.  Follow-up in 2 weeks if symptoms not improving.

## 2013-07-04 NOTE — Patient Instructions (Signed)
Please continue medications as prescribed.  Resume probiotic daily in addition to your Protonix.  Avoid trigger foods like -- caffeine, chocolate or peppermint.  Elevate the head of your bed.  Please call or return to clinic if symptoms are not improving. It was a pleasure participating in  Your care today.  Diet for Gastroesophageal Reflux Disease, Adult Reflux (acid reflux) is when acid from your stomach flows up into the esophagus. When acid comes in contact with the esophagus, the acid causes irritation and soreness (inflammation) in the esophagus. When reflux happens often or so severely that it causes damage to the esophagus, it is called gastroesophageal reflux disease (GERD). Nutrition therapy can help ease the discomfort of GERD. FOODS OR DRINKS TO AVOID OR LIMIT  Smoking or chewing tobacco. Nicotine is one of the most potent stimulants to acid production in the gastrointestinal tract.  Caffeinated and decaffeinated coffee and black tea.  Regular or low-calorie carbonated beverages or energy drinks (caffeine-free carbonated beverages are allowed).   Strong spices, such as black pepper, white pepper, red pepper, cayenne, curry powder, and chili powder.  Peppermint or spearmint.  Chocolate.  High-fat foods, including meats and fried foods. Extra added fats including oils, butter, salad dressings, and nuts. Limit these to less than 8 tsp per day.  Fruits and vegetables if they are not tolerated, such as citrus fruits or tomatoes.  Alcohol.  Any food that seems to aggravate your condition. If you have questions regarding your diet, call your caregiver or a registered dietitian. OTHER THINGS THAT MAY HELP GERD INCLUDE:   Eating your meals slowly, in a relaxed setting.  Eating 5 to 6 small meals per day instead of 3 large meals.  Eliminating food for a period of time if it causes distress.  Not lying down until 3 hours after eating a meal.  Keeping the head of your bed raised  6 to 9 inches (15 to 23 cm) by using a foam wedge or blocks under the legs of the bed. Lying flat may make symptoms worse.  Being physically active. Weight loss may be helpful in reducing reflux in overweight or obese adults.  Wear loose fitting clothing EXAMPLE MEAL PLAN This meal plan is approximately 2,000 calories based on [email or web address] meal planning guidelines. Breakfast   cup cooked oatmeal.  1 cup strawberries.  1 cup low-fat milk.  1 oz almonds. Snack  1 cup cucumber slices.  6 oz yogurt (made from low-fat or fat-free milk). Lunch  2 slice whole-wheat bread.  2 oz sliced [place].  2 tsp mayonnaise.  1 cup blueberries.  1 cup snap peas. Snack  6 whole-wheat crackers.  1 oz string cheese. Dinner   cup brown rice.  1 cup mixed veggies.  1 tsp olive oil.  3 oz grilled fish. Document Released: 04/21/2005 Document Revised: 07/14/2011 Document Reviewed: 03/07/2011 [facility] Patient Information 2014 [place], [place].

## 2013-07-04 NOTE — Progress Notes (Signed)
Patient presents to clinic today for ER follow-up of pneumonia diagnosed via CXR in ER on 06/21/13.  Patient given Rx Rocephin and Azithromycin while in ER.  Sent home with Rx for Z-pack.  Patient endorses taking medication as prescribed.  Endorses full resolution of symptoms.  Patient denies fever, chills, sweats, cough, shortness of breath, wheezing or pleuritic chest pain.  Patient has some residual fatigue, but states she has much more energy that a week ago.  Patient does complain of increased belching and a foul-odor to her belches.  Patient has history of GERD for which she takes Protonix daily with good relief of symptoms.  Patient denies change in diet or late-night eating.  Denies epigastric pain, globus or dysphagia.  Patient recently stopped her probiotic.  Symptoms began a day or so later.  Past Medical History  Diagnosis Date  . Thyroid disease   . Hyperlipidemia   . Diabetes mellitus     type 2  . Hypoxia 10/07/2010  . Skin lesion of back 10/18/2010  . Viral infection characterized by skin and mucous membrane lesions 10/18/2010  . Vitamin D deficiency 11/21/2010  . Back pain 11/21/2010  . Motion sickness 12/04/2010  . UTI (lower urinary tract infection) 09/08/2011  . Hot flashes 10/06/2011  . Bronchitis 02/28/2012  . Tubular adenoma of colon 2011, 2014  . Hypothyroidism   . Depression   . GERD (gastroesophageal reflux disease)   . Arthritis   . Leukocytosis, unspecified 10/24/2012  . Hypertension 11/13/10    ECHO- EF >55%-Left Ventricular systolic normal.Mild mitral regurgitation. Mild tricupsid regurgitation. There is a small posterior pericardial perfusion. No hemodynamic compromise.   . Coronary artery disease 11/13/10    Nuclear stress test-normal low risk scan. EF 63%.  . 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 Cataract 10/08/2010  . Diverticulitis   . Heart murmur     MVP  . Renal lithiasis 08/28/2012  . Fibromyalgia   . Complication of anesthesia     woke up while tube  still inserted 1 shoulder surgery in [place]asy  . Myocardial infarction   . Sleep apnea 06/01/06    sleep study [facility]- 279 arousals with index of 52.0/hr for total sleep time.Marland Kitchen AHI 51.99/hr during total sleep. 72.00/hr during REM sleep highest heart rate awake was 92 beats/ min lowest awake was 72. Highest heartrate while asleep was 92 and the lowest was 61.  . On supplemental oxygen therapy     uses 2 1/2 liter oxygen per mask at hs  . Constipation 02/13/2013    Current Outpatient Prescriptions on File Prior to Visit  Medication Sig Dispense Refill  . allopurinol (ZYLOPRIM) 100 MG tablet TAKE 1 TABLET EVERY DAY  30 tablet  2  . aspirin EC 81 MG tablet Take 81 mg by mouth 2 (two) times daily.      . carvedilol (COREG) 6.25 MG tablet Take 6.25 mg by mouth 2 (two) times daily with a meal. Takes  1/2 of 6.25 pill bid      . Cholecalciferol (VITAMIN D) 2000 UNITS tablet Take 2,000 Units by mouth daily. D 3      . clopidogrel (PLAVIX) 75 MG tablet Take 75 mg by mouth daily before breakfast.      . escitalopram (LEXAPRO) 20 MG tablet TAKE 2 TABLETS (40 MG TOTAL) BY MOUTH DAILY.  60 tablet  5  . ezetimibe-simvastatin (VYTORIN) 10-40 MG per tablet Take 1 tablet by mouth at bedtime.      Marland Kitchen  Flaxseed, Linseed, (FLAX SEEDS PO) Take 1 capsule by mouth 2 (two) times daily. 1200 mg bid      . gabapentin (NEURONTIN) 300 MG capsule Take 2 capsules (600 mg total) by mouth 2 (two) times daily.  120 capsule  5  . isosorbide mononitrate (IMDUR) 60 MG 24 hr tablet Take 1 tablet (60 mg total) by mouth daily before breakfast.  30 tablet  4  . Liraglutide (VICTOZA) 18 MG/3ML SOPN Inject 1.8 mg into the skin daily.  3 mL  4  . losartan (COZAAR) 25 MG tablet TAKE 1 TABLET BY MOUTH EVERY DAY  30 tablet  7  . metFORMIN (GLUCOPHAGE) 1000 MG tablet TAKE 1 TABLET (1,000 MG TOTAL) BY MOUTH 2 (TWO) TIMES DAILY WITH A MEAL.  60 tablet  2  . Milnacipran (SAVELLA) 50 MG TABS tablet Take 50 mg by mouth every  morning.      . ondansetron (ZOFRAN-ODT) 8 MG disintegrating tablet Take 1 tablet (8 mg total) by mouth every 8 (eight) hours as needed for nausea.  20 tablet  1  . ONE TOUCH ULTRA TEST test strip USE TO CHECK BLOOD SUGAR EVERY MORNING AND AS NEEDED  100 each  2  . pantoprazole (PROTONIX) 40 MG tablet TAKE 1 TABLET EVERY DAY  90 tablet  2  . UNABLE TO FIND CPAP THERAPY       No current facility-administered medications on file prior to visit.    Allergies  Allergen Reactions  . Percocet [Oxycodone-Acetaminophen] Itching    Family History  Problem Relation Age of Onset  . Heart disease Father   . Diabetes Sister   . Hypertension Sister   . Depression Sister   . Colon cancer Sister     s/p colectomy  . Emphysema Brother     smoker  . Diabetes Daughter   . Hypertension Daughter   . Depression Daughter   . Diverticulitis Daughter   . Other Daughter     Trichotrilomania  . Aneurysm Maternal Grandmother     brain aneurysm  . Diabetes Paternal Grandmother   . Hypertension Paternal Grandmother   . Colon cancer Paternal Grandmother   . Crohn's disease Paternal Grandfather   . Emphysema Brother     smoker  . Heart disease Brother   . Diabetes Brother   . Thyroid cancer Brother   . Coronary artery disease Brother     s/p MI's and 7 stents  . Diabetes Brother   . Other Brother     back disease w/ bulging discs  . Colon polyps Brother     esophageal and tongue polyps  . Diabetes Sister   . Other Sister     short term memory loss  . Aneurysm Sister     brain aneursm s/p coiling  . Diabetes Son   . Anxiety disorder Son     History   Social History  . Marital Status: Married    Spouse Name: N/A    Number of Children: N/A  . Years of Education: N/A   Occupational History  . retired Control and instrumentation engineer    Social History Main Topics  . Smoking status: Never Smoker   . Smokeless tobacco: Never Used  . Alcohol Use: No  . Drug Use: No  . Sexual Activity: Yes     Partners: Male   Other Topics Concern  . None   Social History Narrative  . None    Review of Systems - See HPI.  All other ROS  are negative.  BP 142/84  Pulse 99  Temp(Src) 98.4 F (36.9 C) (Oral)  Resp 16  Ht $R'5\' 1"'ze$  (1.549 m)  Wt 237 lb 6 oz (107.673 kg)  BMI 44.87 kg/m2  SpO2 95%  Physical Exam  Constitutional: She is oriented to person, place, and time and well-developed, well-nourished, and in no distress.  HENT:  Head: Normocephalic and atraumatic.  Right Ear: External ear normal.  Left Ear: External ear normal.  Nose: Nose normal.  Mouth/Throat: Oropharynx is clear and moist. No oropharyngeal exudate.  TM within normal limits bilaterally.  Eyes: Conjunctivae are normal. Pupils are equal, round, and reactive to light.  Neck: Neck supple.  Cardiovascular: Normal rate, regular rhythm, normal heart sounds and intact distal pulses.   Pulmonary/Chest: Effort normal and breath sounds normal. No respiratory distress. She has no wheezes. She has no rales. She exhibits no tenderness.  Abdominal: Soft. Bowel sounds are normal. There is no tenderness.  Lymphadenopathy:    She has no cervical adenopathy.  Neurological: She is alert and oriented to person, place, and time.  Skin: Skin is warm and dry. No rash noted.  Psychiatric: Affect normal.    Recent Results (from the past 2160 hour(s))  CBC WITH DIFFERENTIAL     Status: None   Collection Time    06/21/13  4:30 PM      Result Value Ref Range   WBC 9.5  4.0 - 10.5 K/uL   RBC 4.43  3.87 - 5.11 MIL/uL   Hemoglobin 13.4  12.0 - 15.0 g/dL   HCT 40.6  36.0 - 46.0 %   MCV 91.6  78.0 - 100.0 fL   MCH 30.2  26.0 - 34.0 pg   MCHC 33.0  30.0 - 36.0 g/dL   RDW 14.9  11.5 - 15.5 %   Platelets 217  150 - 400 K/uL   Neutrophils Relative % 72  43 - 77 %   Neutro Abs 6.8  1.7 - 7.7 K/uL   Lymphocytes Relative 16  12 - 46 %   Lymphs Abs 1.5  0.7 - 4.0 K/uL   Monocytes Relative 11  3 - 12 %   Monocytes Absolute 1.0  0.1 - 1.0  K/uL   Eosinophils Relative 1  0 - 5 %   Eosinophils Absolute 0.1  0.0 - 0.7 K/uL   Basophils Relative 0  0 - 1 %   Basophils Absolute 0.0  0.0 - 0.1 K/uL  COMPREHENSIVE METABOLIC PANEL     Status: Abnormal   Collection Time    06/21/13  4:30 PM      Result Value Ref Range   Sodium 135 (*) 137 - 147 mEq/L   Potassium 3.3 (*) 3.7 - 5.3 mEq/L   Chloride 96  96 - 112 mEq/L   CO2 25  19 - 32 mEq/L   Glucose, Bld 128 (*) 70 - 99 mg/dL   BUN 30 (*) 6 - 23 mg/dL   Creatinine, Ser 1.40 (*) 0.50 - 1.10 mg/dL   Calcium 8.6  8.4 - 10.5 mg/dL   Total Protein 7.0  6.0 - 8.3 g/dL   Albumin 3.4 (*) 3.5 - 5.2 g/dL   AST 31  0 - 37 U/L   ALT 29  0 - 35 U/L   Alkaline Phosphatase 94  39 - 117 U/L   Total Bilirubin 0.3  0.3 - 1.2 mg/dL   GFR calc non Af Amer 37 (*) >90 mL/min   GFR calc Af Wyvonnia Lora  43 (*) >90 mL/min   Comment: (NOTE)     The eGFR has been calculated using the CKD EPI equation.     This calculation has not been validated in all clinical situations.     eGFR's persistently <90 mL/min signify possible Chronic Kidney     Disease.  URINALYSIS, ROUTINE W REFLEX MICROSCOPIC     Status: Abnormal   Collection Time    06/21/13  4:46 PM      Result Value Ref Range   Color, Urine YELLOW  YELLOW   APPearance CLOUDY (*) CLEAR   Specific Gravity, Urine 1.022  1.005 - 1.030   pH 6.0  5.0 - 8.0   Glucose, UA NEGATIVE  NEGATIVE mg/dL   Hgb urine dipstick NEGATIVE  NEGATIVE   Bilirubin Urine SMALL (*) NEGATIVE   Ketones, ur NEGATIVE  NEGATIVE mg/dL   Protein, ur 30 (*) NEGATIVE mg/dL   Urobilinogen, UA 1.0  0.0 - 1.0 mg/dL   Nitrite NEGATIVE  NEGATIVE   Leukocytes, UA NEGATIVE  NEGATIVE  URINE MICROSCOPIC-ADD ON     Status: Abnormal   Collection Time    06/21/13  4:46 PM      Result Value Ref Range   Squamous Epithelial / LPF RARE  RARE   WBC, UA 0-2  <3 WBC/hpf   RBC / HPF 0-2  <3 RBC/hpf   Bacteria, UA MANY (*) RARE   Casts GRANULAR CAST (*) NEGATIVE   Urine-Other LESS THAN 10 mL OF  URINE SUBMITTED     Comment: MUCOUS PRESENT    Assessment/Plan: CAP (community acquired pneumonia) Resolved.  Patient with residual mild fatigue that is improving daily.  Continue good fluid intake and continue activity as tolerated. Energy should continue to improve.  Return to clinic for recurrence of symptoms.  GERD (gastroesophageal reflux disease) Continue protonix daily.  Restart Probiotic daily.  I feel this combination will reduce bloating/belching while at the same time prevent GERD symptoms.  Avoid trigger foods and late-night eating.  Follow-up in 2 weeks if symptoms not improving.

## 2013-07-05 ENCOUNTER — Telehealth: Payer: Self-pay | Admitting: *Deleted

## 2013-07-05 NOTE — Telephone Encounter (Signed)
Faxed signed CPAP supply order dated 06/10/13  to [facility].

## 2013-07-07 ENCOUNTER — Ambulatory Visit: Payer: Medicare Other

## 2013-07-12 ENCOUNTER — Ambulatory Visit
Admission: RE | Admit: 2013-07-12 | Discharge: 2013-07-12 | Disposition: A | Discharge status: Home / Self Care | Payer: Medicare Other | Source: Ambulatory Visit

## 2013-07-12 DIAGNOSIS — Z1231 Encounter for screening mammogram for malignant neoplasm of breast: Secondary | ICD-10-CM

## 2013-07-21 ENCOUNTER — Ambulatory Visit (INDEPENDENT_AMBULATORY_CARE_PROVIDER_SITE_OTHER): Payer: Medicare Other | Admitting: Cardiovascular Disease

## 2013-07-21 VITALS — BP 140/82 | HR 86 | Ht 61.0 in | Wt 242.4 lb

## 2013-07-21 DIAGNOSIS — G4730 Sleep apnea, unspecified: Secondary | ICD-10-CM

## 2013-07-21 DIAGNOSIS — E119 Type 2 diabetes mellitus without complications: Secondary | ICD-10-CM

## 2013-07-21 DIAGNOSIS — I2510 Atherosclerotic heart disease of native coronary artery without angina pectoris: Secondary | ICD-10-CM

## 2013-07-21 DIAGNOSIS — I10 Essential (primary) hypertension: Secondary | ICD-10-CM

## 2013-07-21 DIAGNOSIS — K219 Gastro-esophageal reflux disease without esophagitis: Secondary | ICD-10-CM

## 2013-07-21 DIAGNOSIS — E785 Hyperlipidemia, unspecified: Secondary | ICD-10-CM

## 2013-07-21 NOTE — Patient Instructions (Signed)
Your physician recommends that you schedule a follow-up appointment in: AS NEEDED  

## 2013-07-25 ENCOUNTER — Other Ambulatory Visit: Payer: Self-pay | Admitting: Family Medicine

## 2013-07-26 ENCOUNTER — Other Ambulatory Visit: Payer: Self-pay | Admitting: Family Medicine

## 2013-07-31 ENCOUNTER — Encounter: Payer: Self-pay | Admitting: Cardiovascular Disease

## 2013-07-31 NOTE — Progress Notes (Signed)
Patient ID: Amber [name], female   DOB: 11-25-1945, 68 y.o.   MRN: [id number]      HPI: Amber [name] is a 68 y.o. female known coronary artery disease, hypertension, obesity, diabetes mellitus, and obstructive sleep apnea. She presents for sleep clinic followup evaluation. [place] out of [place].  Amber [name] has established coronary artery disease and underwent remote interventions to LAD, circumflex and right coronary arteries. Her last intervention was done in April 2009 with a stent was placed at the LAD ostium and proximal segment proximal to a previously placed stent.  Amber [name] was diagnosed with obstructive sleep apnea in 2008. At that time, she has significant daytime sleepiness, snoring and nonrestorative sleep. She has severe sleep apnea with an HIF approximately 50 to per hour and REM sleep this increased to 72 per hour. She also had significant oxygen desaturation with non-REM sleep to 70% and REM sleep to a nadir of 64%. She has been on CPAP therapy since 2008. Recently, she has had difficulty with her machine. When I had last seen her several months ago  Her CPAP machine has been intermittently nonfunctioning. [facility] and recently had a new CPAP unit which is the ResMed air since 10 although set unit. She has noticed this machine is significantly quieter and much more effective than her old unit.  I did obtain a dye load today from 06/18/2013 through 07/17/2013 on her new CPAP unit. She is meeting Medicare compliance standards and days of usage was 83%. She was recently inflicted with pneumonia. This did result in significant nasal congestion and as result of her usage of greater than 4 hours per night was low at 53%. She has been averaging a median pressure of 11, 95th percentile pressure of 15.1 and maximum pressure of 16.6. She has noticed that now that her pneumonia has cleared and she is breathing better she is sleeping significantly  better than previously. She typically goes to bed at 11 PM. She has a nasal mask, Mirage FX.   She denies restless legs. She denies excessive daytime sleepiness. Epworth Sleepiness Scale: Situation   Chance of Dozing/Sleeping (0 = never , 1 = slight chance , 2 = moderate chance , 3 = high chance )   sitting and reading 0   watching TV 1   sitting inactive in a public place 0   being a passenger in a motor vehicle for an hour or more 2   lying down in the afternoon 3   sitting and talking to someone 0   sitting quietly after lunch (no alcohol) 0   while stopped for a few minutes in traffic as the driver 0   Total Score  6    Past Medical History  Diagnosis Date  . Thyroid disease   . Hyperlipidemia   . Diabetes mellitus     type 2  . Hypoxia 10/07/2010  . Skin lesion of back 10/18/2010  . Viral infection characterized by skin and mucous membrane lesions 10/18/2010  . Vitamin D deficiency 11/21/2010  . Back pain 11/21/2010  . Motion sickness 12/04/2010  . UTI (lower urinary tract infection) 09/08/2011  . Hot flashes 10/06/2011  . Bronchitis 02/28/2012  . Tubular adenoma of colon 2011, 2014  . Hypothyroidism   . Depression   . GERD (gastroesophageal reflux disease)   . Arthritis   . Leukocytosis, unspecified 10/24/2012  . Hypertension 11/13/10    ECHO- EF >55%-Left Ventricular systolic  normal.Mild mitral regurgitation. Mild tricupsid regurgitation. There is a small posterior pericardial perfusion. No hemodynamic compromise.   . Coronary artery disease 11/13/10    Nuclear stress test-normal low risk scan. EF 63%.  . 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 Cataract 10/08/2010  . Diverticulitis   . Heart murmur     MVP  . Renal lithiasis 08/28/2012  . Fibromyalgia   . Complication of anesthesia     woke up while tube still inserted 1 shoulder surgery in [place]  . Myocardial infarction   . Sleep apnea 06/01/06    sleep study [facility]- 279 arousals with index of  52.0/hr for total sleep time.Marland Kitchen AHI 51.99/hr during total sleep. 72.00/hr during REM sleep highest heart rate awake was 92 beats/ min lowest awake was 72. Highest heartrate while asleep was 92 and the lowest was 61.  . On supplemental oxygen therapy     uses 2 1/2 liter oxygen per mask at hs  . Constipation 02/13/2013    Past Surgical History  Procedure Laterality Date  . Angioplasty  1980  . Angioplasty  2006    3 stents  . Angioplasty  2009    2 stents  . Thyroidectomy  1950    for goiter  . Diverticulitis  1978    exploratory  . Right rotator cuff repair  2004  . Left rotator cuff repair  2004  . Bone fusion left foot  2010    7 screws in foot, bunionectomy  . Hip surgery  2008    large bone spur removed, right  . Colonoscopy N/A 07/26/2012    Procedure: COLONOSCOPY;  Surgeon: Lafayette Dragon, MD;  Location: [facility];  Service: Endoscopy;  Laterality: N/A;  . Stent placement  2013    x 3  . Back surgery  2004    lumbar discectomy for bulging disc  . Abdominal hysterectomy  1977    partial  . Cholecystectomy  1970's    Allergies  Allergen Reactions  . Percocet [Oxycodone-Acetaminophen] Itching    Current Outpatient Prescriptions  Medication Sig Dispense Refill  . aspirin EC 81 MG tablet Take 81 mg by mouth 2 (two) times daily.      . carvedilol (COREG) 6.25 MG tablet Take 6.25 mg by mouth 2 (two) times daily with a meal. Takes  1/2 of 6.25 pill bid      . escitalopram (LEXAPRO) 20 MG tablet TAKE 2 TABLETS (40 MG TOTAL) BY MOUTH DAILY.  60 tablet  5  . ezetimibe-simvastatin (VYTORIN) 10-40 MG per tablet Take 1 tablet by mouth at bedtime.      . Flaxseed, Linseed, (FLAX SEEDS PO) Take 1 capsule by mouth 2 (two) times daily. 1200 mg bid      . gabapentin (NEURONTIN) 300 MG capsule Take 2 capsules (600 mg total) by mouth 2 (two) times daily.  120 capsule  5  . isosorbide mononitrate (IMDUR) 60 MG 24 hr tablet Take 1 tablet (60 mg total) by mouth daily before breakfast.   30 tablet  4  . Liraglutide (VICTOZA) 18 MG/3ML SOPN Inject 1.8 mg into the skin daily.  3 mL  4  . losartan (COZAAR) 25 MG tablet TAKE 1 TABLET BY MOUTH EVERY DAY  30 tablet  7  . metFORMIN (GLUCOPHAGE) 1000 MG tablet TAKE 1 TABLET (1,000 MG TOTAL) BY MOUTH 2 (TWO) TIMES DAILY WITH A MEAL.  60 tablet  2  . Milnacipran (SAVELLA) 50 MG TABS tablet  Take 50 mg by mouth every morning.      . ondansetron (ZOFRAN-ODT) 8 MG disintegrating tablet Take 1 tablet (8 mg total) by mouth every 8 (eight) hours as needed for nausea.  20 tablet  1  . ONE TOUCH ULTRA TEST test strip USE TO CHECK BLOOD SUGAR EVERY MORNING AND AS NEEDED  100 each  2  . UNABLE TO FIND CPAP THERAPY      . allopurinol (ZYLOPRIM) 100 MG tablet TAKE 1 TABLET EVERY DAY  30 tablet  2  . clopidogrel (PLAVIX) 75 MG tablet TAKE 1 TABLET BY MOUTH EVERY DAY  90 tablet  1  . pantoprazole (PROTONIX) 40 MG tablet TAKE 1 TABLET EVERY DAY  90 tablet  1   No current facility-administered medications for this visit.    Social history is notable in that she is married, has 3 children and 5 grandchildren. She has lost weight. No tobacco or alcohol use  ROS negative for fever, chills or night sweats. Positive for history of cataracts. She denies change in hearing. She denies cough or increased sputum production. Her previous shortness of breath has improved. She denies anginal symptoms. She does have remote history of kidney stones. There is a history of hypertension. She does have diabetes mellitus. She does have GERD. She denies nausea or vomiting. She is unaware of blood in her stool or urine. At times there is occasional leg swelling. She is diabetic. There also is a history of hypothyroidism. She also has history of neuropathy. Is also history of fibromyalgia. Sleep history as noted above. Other comprehensive 14 point system review is negative.  PE BP 140/82  Pulse 86  Ht 5\' 1"  (1.549 m)  Wt 242 lb 6.4 oz (109.952 kg)  BMI 45.82 kg/m2  Weight  has improved from 261-245. General: Alert, oriented, no distress.  Skin: normal turgor, no rashes HEENT: Normocephalic, atraumatic. Pupils round and reactive; sclera anicteric; extraocular muscles intact; Fundi no hemorrhages or exudates. Nose without nasal septal hypertrophy Mouth/Parynx benign; Mallinpatti scale 3 Neck: No JVD, no carotid bruits; normal carotid upstroke Lungs: clear to ausculatation and percussion; no wheezing or rales  Chest wall: No tenderness to palpation Heart: RRR, s1 s2 normal 1/6 systolic murmur at the no S3 gallop. Abdomen: soft, nontender; no hepatosplenomehaly, BS+; abdominal aorta nontender and not dilated by palpation. Back: No CVA tenderness Pulses 2+ Extremities: Trivial ankle edema,no clubbinbg cyanosis, Homan's sign negative  Neurologic: grossly nonfocal; cranial nerves intact. Psychological: Normal affect and mood.   LABS:  BMET    Component Value Date/Time   NA 135* 06/21/2013 1630   K 3.3* 06/21/2013 1630   CL 96 06/21/2013 1630   CO2 25 06/21/2013 1630   GLUCOSE 128* 06/21/2013 1630   BUN 30* 06/21/2013 1630   CREATININE 1.40* 06/21/2013 1630   CREATININE 0.86 02/08/2013 1227   CALCIUM 8.6 06/21/2013 1630   GFRNONAA 37* 06/21/2013 1630   GFRAA 43* 06/21/2013 1630     Hepatic Function Panel     Component Value Date/Time   PROT 7.0 06/21/2013 1630   ALBUMIN 3.4* 06/21/2013 1630   AST 31 06/21/2013 1630   ALT 29 06/21/2013 1630   ALKPHOS 94 06/21/2013 1630   BILITOT 0.3 06/21/2013 1630   BILIDIR 0.1 02/08/2013 1227   IBILI 0.2 02/08/2013 1227     CBC    Component Value Date/Time   WBC 9.5 06/21/2013 1630   RBC 4.43 06/21/2013 1630   HGB 13.4 06/21/2013 1630   HCT  40.6 06/21/2013 1630   PLT 217 06/21/2013 1630   MCV 91.6 06/21/2013 1630   MCH 30.2 06/21/2013 1630   MCHC 33.0 06/21/2013 1630   RDW 14.9 06/21/2013 1630   LYMPHSABS 1.5 06/21/2013 1630   MONOABS 1.0 06/21/2013 1630   EOSABS 0.1 06/21/2013 1630   BASOSABS 0.0 06/21/2013 1630      BNP    Component Value Date/Time   PROBNP <30.0 04/23/2007 1740    Lipid Panel     Component Value Date/Time   CHOL 153 07/27/2012 1007   TRIG 252* 07/27/2012 1007   HDL 37* 07/27/2012 1007   CHOLHDL 4.1 07/27/2012 1007   VLDL 50* 07/27/2012 1007   LDLCALC 66 07/27/2012 1007     RADIOLOGY: No results found.    ASSESSMENT AND PLAN:  Ms Chevella Pearce has established coronary artery disease, hypertension, morbid obesity, type 2 diabetes mellitus, GERD, hyperlipidemia, as well as obstructive sleep apnea. On her current medical regimen, her blood pressure is controlled. She has lost weight which is undoubtedly affecting both her blood pressure as well as obstructive sleep apnea.. She has documented severe obstructive sleep apnea and on her initial diagnostic study had severe nocturnal oxygen desaturation. She has been on CPAP therapy for 7 years and has been documented to have sleep compliance and benefit.  She was recently able to get a new med care since 10 although set CPAP unit. She notices that this machine is markedly improved from her old unit. She is set at an auto up to a maximum of 20 cm pressure. She is using CPAP mean Medicare compliance standards on days used. However, with her recent episode of pneumonia illness this did affect her greater than for her use. Her 95th percentile pressure was 15.1. She is using a nasal mask carotid fracture. On most days she did not have significant leak but there were several days with leak and on these days her AHI was slightly elevated. Otherwise her AHI was excellent overall at 1.2. She's not having any anginal symptoms. I commended her on her weight loss. She will be moving I believe to the [facility] area and will be establishing with new physicians down there. I will always be happy to see her in the future if she comes back to [facility] area for followup evaluation    Troy Sine, MD, [facility]  07/31/2013 10:48 PM

## 2013-08-02 ENCOUNTER — Telehealth: Payer: Self-pay | Admitting: Family Medicine

## 2013-08-02 NOTE — Telephone Encounter (Signed)
Left a message on pts vm for pt/spouse to return my call. I don't see where pt is taking Ranitidine?

## 2013-08-02 NOTE — Telephone Encounter (Signed)
Refill ranitidine

## 2013-08-08 ENCOUNTER — Other Ambulatory Visit: Payer: Self-pay | Admitting: *Deleted

## 2013-08-08 MED ORDER — CARVEDILOL 6.25 MG PO TABS
3.1250 mg | ORAL_TABLET | Freq: Two times a day (BID) | ORAL | Status: DC
Start: 2013-08-08 — End: 2013-09-07

## 2013-08-08 NOTE — Telephone Encounter (Signed)
Rx refill sent to patients pharmacy  

## 2013-08-24 ENCOUNTER — Telehealth: Payer: Self-pay | Admitting: *Deleted

## 2013-08-24 NOTE — Telephone Encounter (Signed)
Patient's husband telephoned demanding a pressure change. Per Anderson Malta she can change settings based on information she has. She just needs a prescription. Faxed new prescription [facility].

## 2013-08-29 MED ORDER — RANITIDINE HCL 300 MG PO CAPS: 300.0000 mg | ORAL_CAPSULE | Freq: Every evening | ORAL | Status: AC | PRN

## 2013-08-29 NOTE — Telephone Encounter (Signed)
I spoke to pts spouse and he states pt is still taking this and would like a 90 day refill to pharmacy.  RX sent

## 2013-08-30 ENCOUNTER — Telehealth: Payer: Self-pay | Admitting: Family Medicine

## 2013-08-30 MED ORDER — ESCITALOPRAM OXALATE 20 MG PO TABS: 40.0000 mg | ORAL_TABLET | Freq: Every day | ORAL | Status: DC

## 2013-08-30 NOTE — Telephone Encounter (Signed)
Refill escitalopram

## 2013-09-05 ENCOUNTER — Telehealth: Payer: Self-pay | Admitting: *Deleted

## 2013-09-05 NOTE — Telephone Encounter (Signed)
James from [facility] was calling in regards to mislabeling Amber [name]'s medicine Coreg 6.25 mg. Our recent Rx stated 1/2 tablet bid and he told her 1 tablet bid. For a month she has been taking it incorrectly. Pt has had no incident with the medication.  TK

## 2013-09-05 NOTE — Telephone Encounter (Signed)
Call to pt and verified x 2.  Pt put husband on phone.  Husband informed RN calling r/t info from pharmacist about mislabeled Coreg.  Husband stated he has been giving pt 1 1/2 tabs BID b/c that's what she has been on for a long time.  Stated pt has not had any problems except sweating a lot occasionally.    Husband informed directions on script state to take half of a 6.25 mg tab twice daily, which is much lower than what he stated he has been giving pt.  Denied checking BP and stated they are moving and monitor has been packed away.  Unsure of pt's BP now.  Informed husband that Dr. Claiborne Billings will be notified for further instructions.  Verbalized understanding.  Message forwarded to Dr. Claiborne Billings.

## 2013-09-06 NOTE — Telephone Encounter (Signed)
Calling to give you his bp for today .Marland Kitchen Which is 156/103 pulse rate 104.Marland Kitchen And she did take the 1/2 tablet ...  Please call at [phone]

## 2013-09-06 NOTE — Telephone Encounter (Signed)
Returned call to pt's husband.  Stated pt has had sweats for years, but Dr. Claiborne Billings changed her meds and it had stopped.  Stated it just came back in the last 2 weeks.  Stated he doesn't see this as an emergency and it did subside when the medication was reduced.  Stated pt's BP was too low, he thinks, and once it came up the sweating stopped.    Advised pt come in for BP check and husband stated he is riding [facility] now.  Stated he will go in to check BP.  Advised pt sit for at least 5 mins before checking and he call back w/ result.  Verbalized understanding.

## 2013-09-06 NOTE — Telephone Encounter (Signed)
Was in process of moving and phone disconnected  Calling back to see if nurse returned call after forwarding to Dr Claiborne Billings  Says she is sweating a lot right now.  Please call.

## 2013-09-06 NOTE — Telephone Encounter (Signed)
Dr. Claiborne Billings notified and advised pt increase back to 6.25 mg tabs: take 1 1/2 tabs (9.75 mg) BID.    Returned call and informed husband per instructions by MD.  Also advised pt come in for BP check in 1 week.  Verbalized understanding and agreed w/ plan.   BP check scheduled for 5.12.15 at 3:30pm w/ Lyda Jester, PA-C.  Husband agreed to call back w/ any problems or concerns.

## 2013-09-07 ENCOUNTER — Telehealth: Payer: Self-pay | Admitting: Cardiovascular Disease

## 2013-09-07 MED ORDER — CARVEDILOL 6.25 MG PO TABS: 9.3750 mg | ORAL_TABLET | Freq: Two times a day (BID) | ORAL | Status: DC

## 2013-09-07 NOTE — Telephone Encounter (Signed)
^^  Correction for phone note on 5.4.15 and entry on 5.5.15: Dose increased to 9.375 mg BID, not 9.75 mg.^^  Rx sent to pharmacy w/ instructions to increase and office aware of pt taking incorrectly.

## 2013-09-07 NOTE — Telephone Encounter (Signed)
He wanted you to know that pt took Amber [name].wrong for an entire month. She was taking  1 tablet 2 times a day. She should have been taking a 1/2 tablet 2 time a day. He wanted the doctor to be aware of this. One tavl Wrong for an entire month.

## 2013-09-08 ENCOUNTER — Telehealth: Payer: Self-pay

## 2013-09-08 ENCOUNTER — Other Ambulatory Visit: Payer: Self-pay | Admitting: Family Medicine

## 2013-09-08 DIAGNOSIS — I10 Essential (primary) hypertension: Secondary | ICD-10-CM

## 2013-09-08 DIAGNOSIS — R110 Nausea: Secondary | ICD-10-CM

## 2013-09-08 DIAGNOSIS — E785 Hyperlipidemia, unspecified: Secondary | ICD-10-CM

## 2013-09-08 DIAGNOSIS — E119 Type 2 diabetes mellitus without complications: Secondary | ICD-10-CM

## 2013-09-08 DIAGNOSIS — D649 Anemia, unspecified: Secondary | ICD-10-CM

## 2013-09-08 DIAGNOSIS — E039 Hypothyroidism, unspecified: Secondary | ICD-10-CM

## 2013-09-08 MED ORDER — ONDANSETRON 8 MG PO TBDP: 8.0000 mg | ORAL_TABLET | Freq: Three times a day (TID) | ORAL | Status: AC | PRN

## 2013-09-08 NOTE — Telephone Encounter (Signed)
Message copied by Varney Daily on Thu Sep 08, 2013  4:47 PM ------      Message from: Penni Homans A      Created: Thu Sep 08, 2013  4:39 PM       Renal, cbc, tsh, hepatic, lipid, hgba1c labs after 09/13/13 ------

## 2013-09-12 NOTE — Telephone Encounter (Signed)
agreed

## 2013-09-13 ENCOUNTER — Ambulatory Visit (INDEPENDENT_AMBULATORY_CARE_PROVIDER_SITE_OTHER): Payer: Medicare Other | Admitting: Cardiology

## 2013-09-13 ENCOUNTER — Other Ambulatory Visit: Payer: Self-pay | Admitting: *Deleted

## 2013-09-13 ENCOUNTER — Telehealth: Payer: Self-pay | Admitting: Family Medicine

## 2013-09-13 VITALS — BP 140/78 | HR 80 | Ht 61.0 in | Wt 243.0 lb

## 2013-09-13 DIAGNOSIS — N951 Menopausal and female climacteric states: Secondary | ICD-10-CM

## 2013-09-13 DIAGNOSIS — I10 Essential (primary) hypertension: Secondary | ICD-10-CM

## 2013-09-13 DIAGNOSIS — R232 Flushing: Secondary | ICD-10-CM

## 2013-09-13 MED ORDER — LOSARTAN POTASSIUM 25 MG PO TABS: 50.0000 mg | ORAL_TABLET | Freq: Every day | ORAL | Status: DC

## 2013-09-13 NOTE — Telephone Encounter (Signed)
Patient never picked up letter from 06/08/13

## 2013-09-13 NOTE — Assessment & Plan Note (Signed)
Unlikely to be related to HTN or cardiac issues. No concern for serotonin syndrome. May be related to estrogen deficiency, but patient chose to stop estrogen. No changes at this time. Follow up with PCP.

## 2013-09-13 NOTE — Progress Notes (Signed)
Patient ID: Amber [name], female   DOB: 11/11/1945, 68 y.o.   MRN: [id number]     09/13/2013 NAZLI PENN   01/27/1946  [id number]  Primary Physicia Penni Homans, MD Primary Cardiologist:Thomas Floyce Stakes, MD, [facility]  HPI:   Amber [name] is a 68 y.o. Female with  known coronary artery disease, hypertension, obesity, diabetes mellitus, and obstructive sleep apnea. She presents with her husband to discuss excessive sweating and blood pressure.   Excessive Sweating - This has been problematic for the patient for greater than 5 years. It occurs intermittently while at rest. She has previously had her thyroid evaluated and TSH was normal when last checked in July 2014. She also notes that a previous doctor tried her on estrogen therapy, which did not help. She is taking two medications that impact serotonin, lexapro and sevella, but these were started after the symptoms. She notes that Dr. Claiborne Billings decreased her losartan from 50 mg daily to 25 mg daily to see if this would help. She notes that initially it did, but in the past month the sweating has returned. She thinks it is due to stress, and her husband agrees.   Hypertension - Pt with a history of hypertension and currently taking carvedilol 9.375 mg BID and losartan 25 mg daily. She is concerned that her blood pressure has been elevated recently. [facility] and notes that it is consistently 170/100. It is not associated with headache, vision changes, chest pain or shortness of breath. Pt does not exercise, but states that she is losing weight.    Current Outpatient Prescriptions  Medication Sig Dispense Refill  . allopurinol (ZYLOPRIM) 100 MG tablet TAKE 1 TABLET EVERY DAY  30 tablet  2  . aspirin EC 81 MG tablet Take 81 mg by mouth 2 (two) times daily.      . carvedilol (COREG) 6.25 MG tablet Take 1.5 tablets (9.375 mg total) by mouth 2 (two) times daily.  135 tablet  0  . clopidogrel (PLAVIX) 75 MG tablet TAKE 1  TABLET BY MOUTH EVERY DAY  90 tablet  1  . escitalopram (LEXAPRO) 20 MG tablet Take 2 tablets (40 mg total) by mouth daily.  60 tablet  2  . ezetimibe-simvastatin (VYTORIN) 10-40 MG per tablet Take 1 tablet by mouth at bedtime.      . Flaxseed, Linseed, (FLAX SEEDS PO) Take 1 capsule by mouth 2 (two) times daily. 1200 mg bid      . gabapentin (NEURONTIN) 300 MG capsule Take 2 capsules (600 mg total) by mouth 2 (two) times daily.  120 capsule  5  . isosorbide mononitrate (IMDUR) 60 MG 24 hr tablet Take 1 tablet (60 mg total) by mouth daily before breakfast.  30 tablet  4  . Liraglutide (VICTOZA) 18 MG/3ML SOPN Inject 1.8 mg into the skin daily.  3 mL  4  . metFORMIN (GLUCOPHAGE) 1000 MG tablet TAKE 1 TABLET (1,000 MG TOTAL) BY MOUTH 2 (TWO) TIMES DAILY WITH A MEAL.  60 tablet  2  . Milnacipran (SAVELLA) 50 MG TABS tablet Take 50 mg by mouth every morning.      . ondansetron (ZOFRAN-ODT) 8 MG disintegrating tablet Take 1 tablet (8 mg total) by mouth every 8 (eight) hours as needed for nausea.  40 tablet  3  . ONE TOUCH ULTRA TEST test strip USE TO CHECK BLOOD SUGAR EVERY MORNING AND AS NEEDED  100 each  2  . pantoprazole (PROTONIX) 40 MG  tablet TAKE 1 TABLET EVERY DAY  90 tablet  1  . ranitidine (ZANTAC) 300 MG capsule Take 1 capsule (300 mg total) by mouth at bedtime as needed and may repeat dose one time if needed for heartburn.  90 capsule  1  . UNABLE TO FIND CPAP THERAPY      . losartan (COZAAR) 25 MG tablet Take 2 tablets (50 mg total) by mouth daily.  60 tablet  7   No current facility-administered medications for this visit.    Allergies  Allergen Reactions  . Percocet [Oxycodone-Acetaminophen] Itching    History   Social History  . Marital Status: Married    Spouse Name: N/A    Number of Children: N/A  . Years of Education: N/A   Occupational History  . retired Control and instrumentation engineer    Social History Main Topics  . Smoking status: Never Smoker   . Smokeless tobacco: Never Used   . Alcohol Use: No  . Drug Use: No  . Sexual Activity: Yes    Partners: Male   Other Topics Concern  . Not on file   Social History Narrative  . No narrative on file     Review of Systems: General: negative for chills, fever, night sweats or weight changes.  Cardiovascular: negative for chest pain, dyspnea on exertion, edema, orthopnea, palpitations, paroxysmal nocturnal dyspnea or shortness of breath Dermatological: negative for rash Respiratory: negative for cough or wheezing Urologic: negative for hematuria Abdominal: negative for nausea, vomiting, diarrhea, bright red blood per rectum, melena, or hematemesis Neurologic: negative for visual changes, syncope, or dizziness All other systems reviewed and are otherwise negative except as noted above.   BP Readings from Last 3 Encounters:  09/13/13 140/78  07/21/13 140/82  07/04/13 142/84     Blood pressure 140/78, pulse 80, height 5\' 1"  (1.549 m), weight 243 lb (110.224 kg).  Gen: morbidly obese, elderly female, non ill appearing Neck: no carotid bruits CV: RRR, no murmur LE: no edema    EKG not performed  ASSESSMENT AND PLAN:   HTN (hypertension) A: BP elevated in community setting but not particularly elevated in the office but given the elevated creatinine, we may need to be more aggressive with her BP control P: Restart losartan 50 mg daily (will take 25 mg x 2) and continue current dose of carvedilol; recheck in 2-3 weeks; f/u creatinine at that time if not drawn by PCP     Talmadge Coventry, MBA 09/13/2013 5:04 PM   I have seen and examined the patient along with Dr. Maricela Bo, and I agree with is assessment and plan. I agree with increasing losartan back to her original dose of 50 mg daily. We also discussed things to avoid: excessive salt consumption, heavy caffeine intake. She is not a smoker. Will re-evaluate her in 2-3 weeks.   Lyda Jester, PA-C

## 2013-09-13 NOTE — Patient Instructions (Signed)
1. Increase your losartan to 50 mg daily  2.Your physician recommends that you schedule a follow-up appointment in:  2 -3 weeks for BP check

## 2013-09-13 NOTE — Assessment & Plan Note (Signed)
A: BP elevated in community setting but not particularly elevated in the office but given the elevated creatinine, we may need to be more aggressive with her BP control P: Restart losartan 50 mg daily (will take 25 mg x 2) and continue current dose of carvedilol; recheck in 2-3 weeks; f/u creatinine at that time if not drawn by PCP

## 2013-09-14 ENCOUNTER — Other Ambulatory Visit: Payer: Self-pay | Admitting: Family Medicine

## 2013-09-19 LAB — RENAL FUNCTION PANEL
Albumin: 4 g/dL (ref 3.5–5.2)
BUN: 20 mg/dL (ref 6–23)
CHLORIDE: 102 meq/L (ref 96–112)
CO2: 29 meq/L (ref 19–32)
Calcium: 9.6 mg/dL (ref 8.4–10.5)
Creat: 0.91 mg/dL (ref 0.50–1.10)
GLUCOSE: 121 mg/dL — AB (ref 70–99)
POTASSIUM: 4.8 meq/L (ref 3.5–5.3)
Phosphorus: 4 mg/dL (ref 2.3–4.6)
SODIUM: 141 meq/L (ref 135–145)

## 2013-09-19 LAB — CBC
HCT: 38.4 % (ref 36.0–46.0)
HEMOGLOBIN: 12.9 g/dL (ref 12.0–15.0)
MCH: 30.5 pg (ref 26.0–34.0)
MCHC: 33.6 g/dL (ref 30.0–36.0)
MCV: 90.8 fL (ref 78.0–100.0)
Platelets: 220 10*3/uL (ref 150–400)
RBC: 4.23 MIL/uL (ref 3.87–5.11)
RDW: 15.2 % (ref 11.5–15.5)
WBC: 9.2 10*3/uL (ref 4.0–10.5)

## 2013-09-19 LAB — HEPATIC FUNCTION PANEL
ALK PHOS: 97 U/L (ref 39–117)
ALT: 12 U/L (ref 0–35)
AST: 17 U/L (ref 0–37)
Albumin: 4 g/dL (ref 3.5–5.2)
BILIRUBIN INDIRECT: 0.3 mg/dL (ref 0.2–1.2)
Bilirubin, Direct: 0.1 mg/dL (ref 0.0–0.3)
Total Bilirubin: 0.4 mg/dL (ref 0.2–1.2)
Total Protein: 6.4 g/dL (ref 6.0–8.3)

## 2013-09-19 LAB — LIPID PANEL
Cholesterol: 166 mg/dL (ref 0–200)
HDL: 42 mg/dL (ref >39)
LDL CALC: 75 mg/dL (ref 0–99)
TRIGLYCERIDES: 243 mg/dL — AB (ref <150)
Total CHOL/HDL Ratio: 4 Ratio
VLDL: 49 mg/dL — AB (ref 0–40)

## 2013-09-19 LAB — HEMOGLOBIN A1C
Hgb A1c MFr Bld: 6.3 % — ABNORMAL HIGH (ref <5.7)
Mean Plasma Glucose: 134 mg/dL — ABNORMAL HIGH (ref <117)

## 2013-09-20 LAB — TSH: TSH: 4.409 u[IU]/mL (ref 0.350–4.500)

## 2013-09-20 IMAGING — CT CT ABD-PELV W/ CM
2 of 5 series · 17 of 46 positions shown, 19 images · IV contrast (APPLIED)
Comparison: 08/15/2011.  07/31/2008.

CLINICAL DATA: Nausea.  Abdominal pain.  Diaphoresis.
Constipation.  The patient refuses oral contrast.  Previous
cholecystectomy and partial hysterectomy.

CT ABDOMEN AND PELVIS WITH CONTRAST
TECHNIQUE: Multidetector CT imaging of the abdomen and pelvis was
performed following the standard protocol during bolus
administration of intravenous contrast.
Contrast: 100mL OMNIPAQUE IOHEXOL 300 MG/ML  SOLN

[Series 2: abd/pelvis 5.0 b31f · axial · 0.85mm/px · z∈[-569,-174]mm · 14 of 89 slices shown, 16 images]
[im 5/89  soft-tissue]
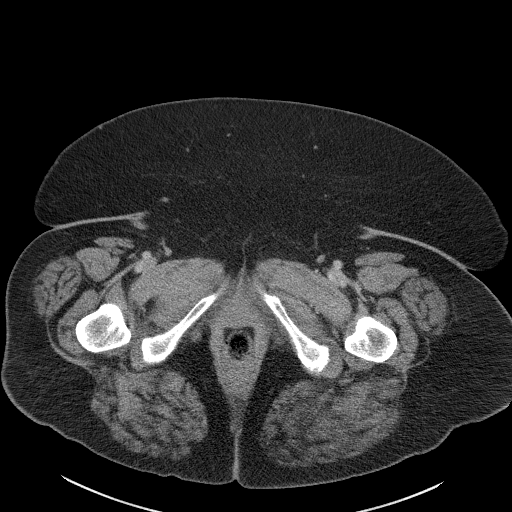
[im 5/89  bone]
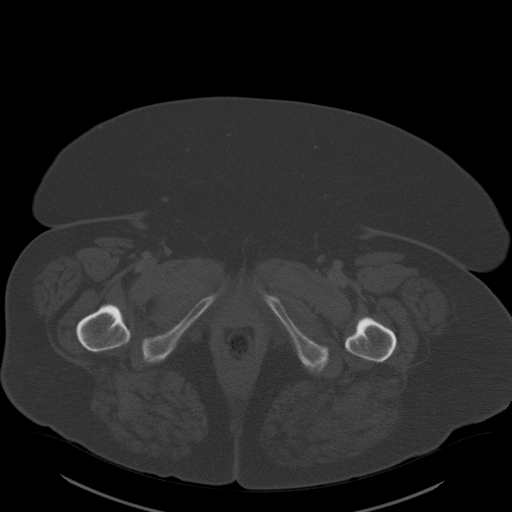
[im 10/89  soft-tissue]
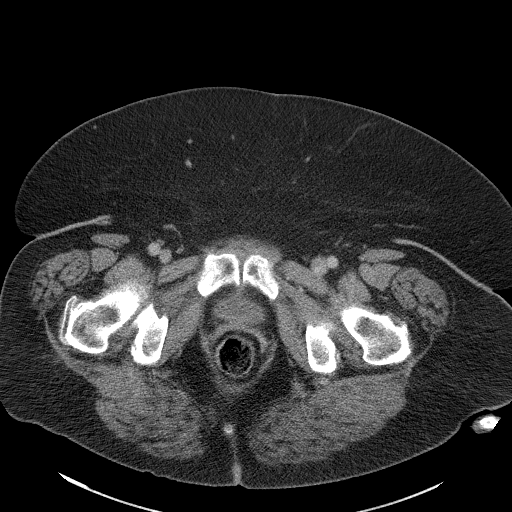
[im 19/89  soft-tissue]
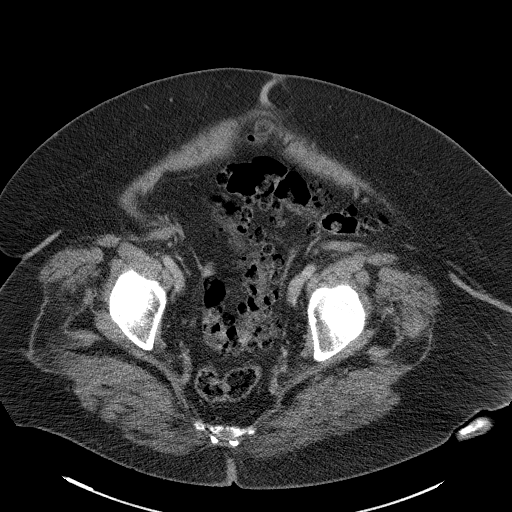
[im 24/89  soft-tissue]
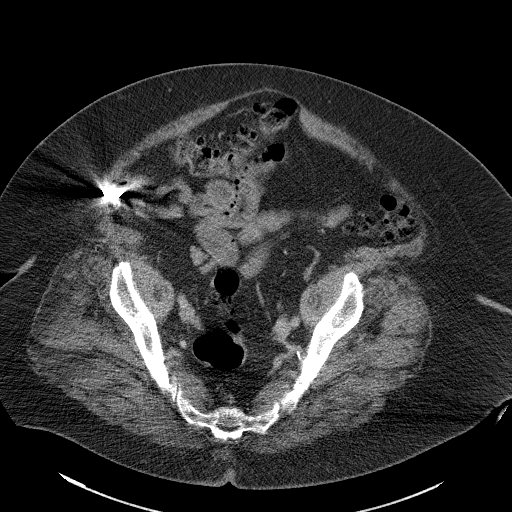
[im 28/89  soft-tissue]
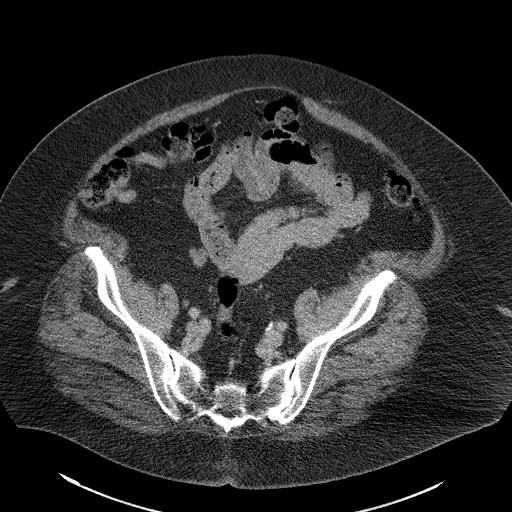
[im 38/89  soft-tissue]
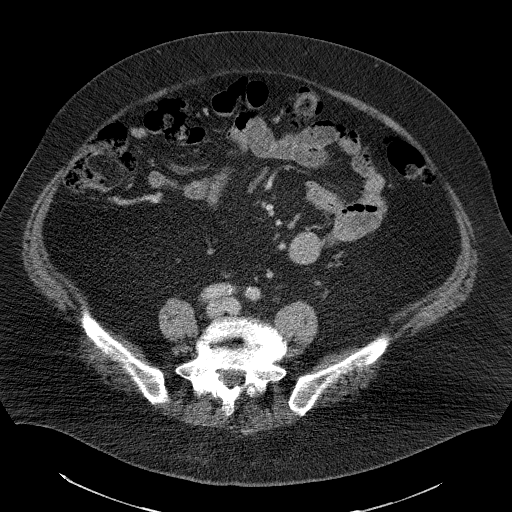
[im 42/89  soft-tissue]
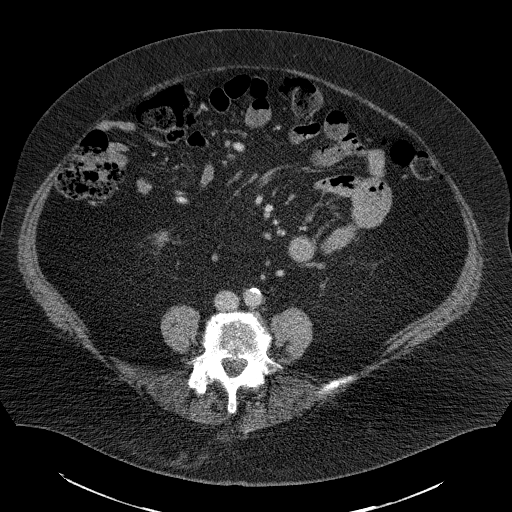
[im 47/89  soft-tissue]
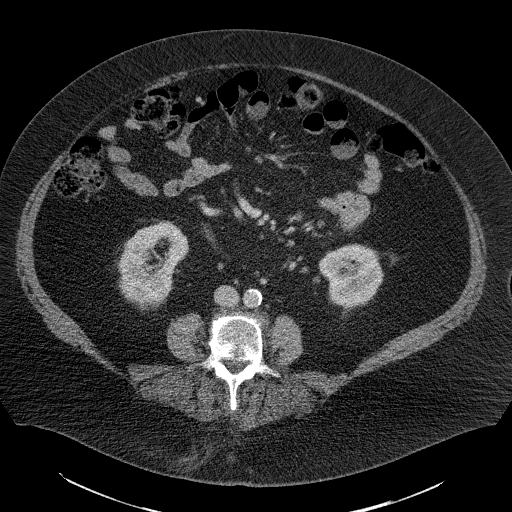
[im 51/89  soft-tissue]
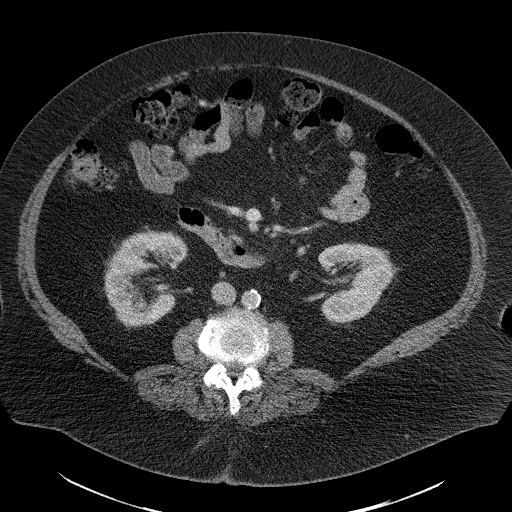
[im 51/89  bone]
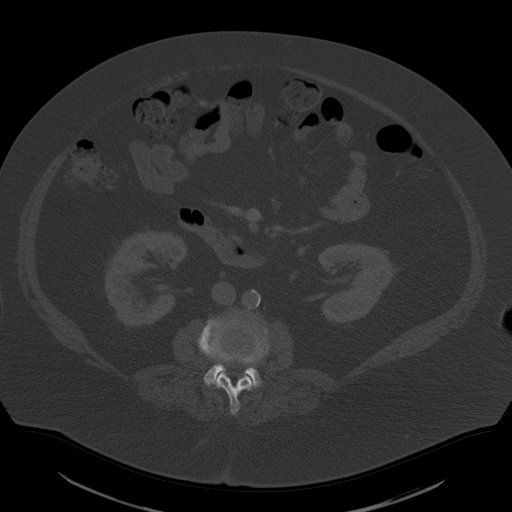
[im 61/89  soft-tissue]
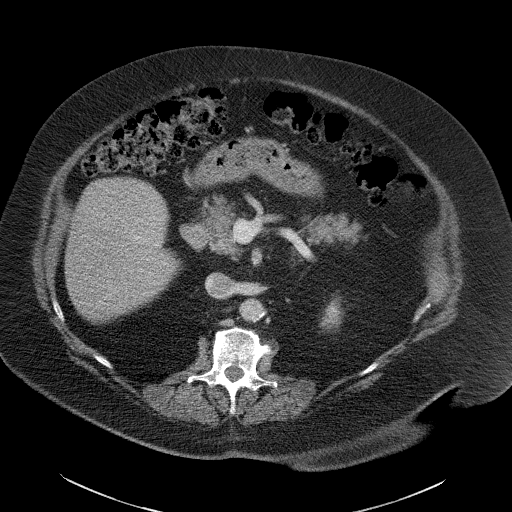
[im 65/89  soft-tissue]
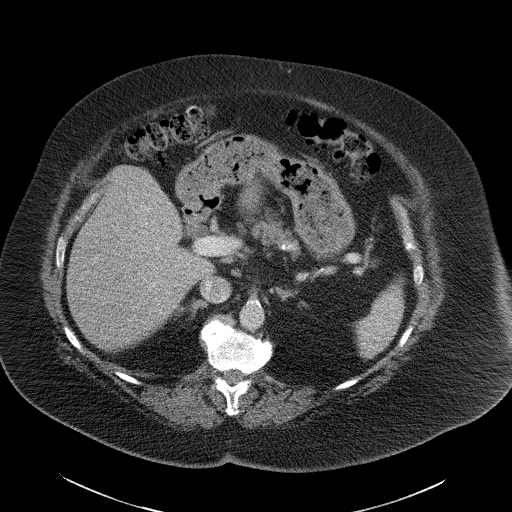
[im 70/89  soft-tissue]
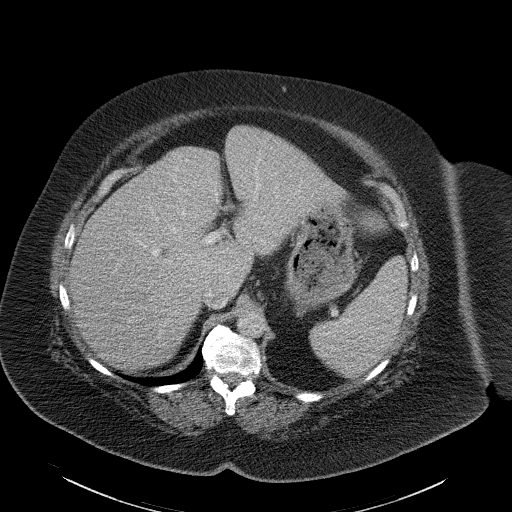
[im 79/89  soft-tissue]
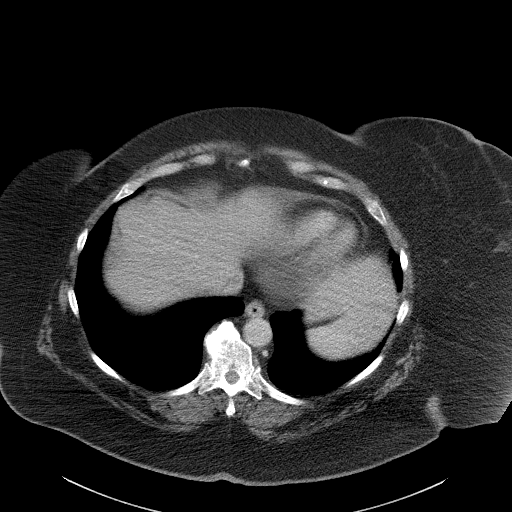
[im 84/89  soft-tissue]
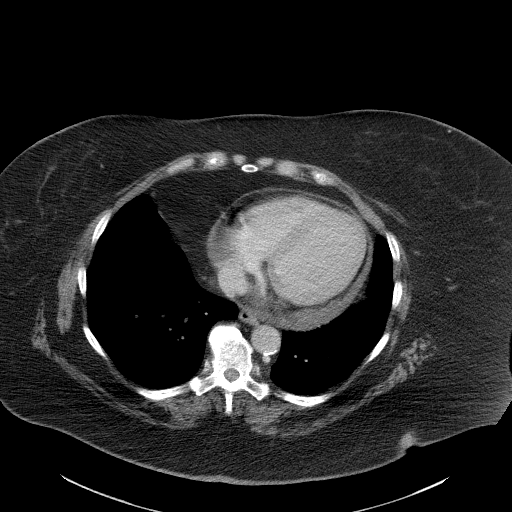

[Series 5: abd/pelvis 3.0 coronal · coronal · 0.86mm/px · 3 of 131 slices shown]
[im 44/131  soft-tissue]
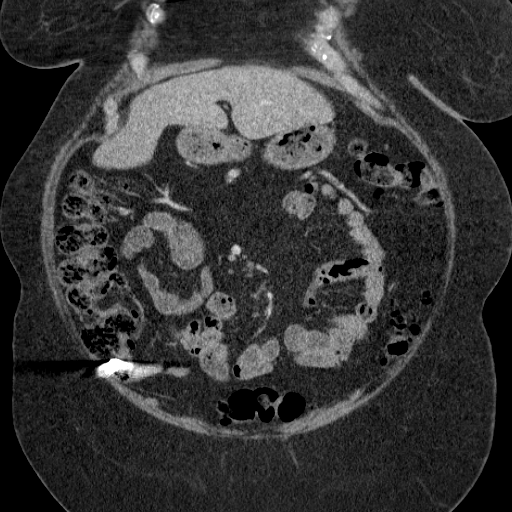
[im 58/131  soft-tissue]
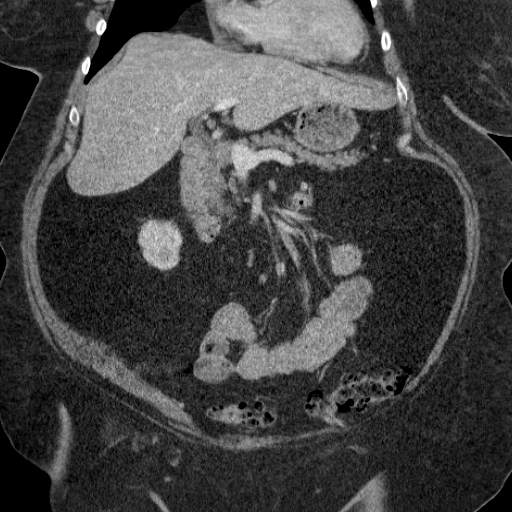
[im 73/131  soft-tissue]
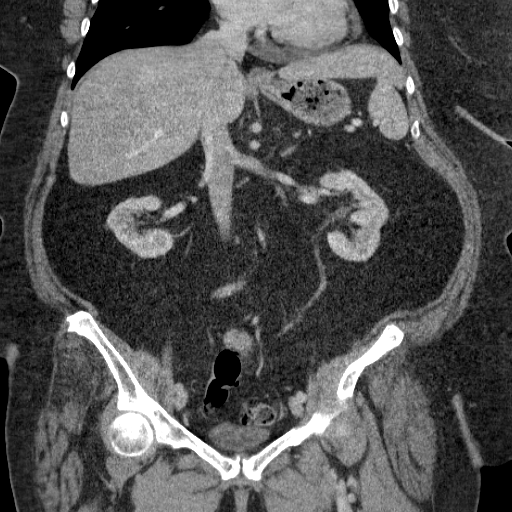

[17 of 46 positions shown; findings below may reference images not displayed]

FINDINGS: Lung bases are clear except for a densely calcified
granuloma at the right lung base measuring 1.5 cm in diameter..
There is a pericardial effusion.  There is coronary artery
calcification and or stenting.

The liver has a normal appearance without focal lesions or biliary
ductal dilatation.  Previous cholecystectomy.  The spleen is
normal.  The pancreas is normal.  The adrenal glands are normal.
The right kidney is normal except for a 9 mm cyst in the ventral
midportion.  The left kidney is normal.  There is atherosclerosis
of the aorta but no aneurysm.  No retroperitoneal mass or
adenopathy.  No free intraperitoneal fluid or air.  There is
diverticulosis without imaging evidence of diverticulitis.  There
is a small ventral hernia containing fat.  There is an ingested
coin within the cecum.  No sign of bowel obstruction or acute bowel
pathology.  There is degenerative change of the lower lumbar spine
most pronounced at L5-S1.
IMPRESSION: Pericardial effusion, chronically seen.

1.5 cm calcified granuloma at the right lung base.

Diverticulosis without evidence of diverticulitis.

Apparent coin within the cecum.

## 2013-09-27 ENCOUNTER — Ambulatory Visit (INDEPENDENT_AMBULATORY_CARE_PROVIDER_SITE_OTHER): Payer: Medicare Other | Admitting: Cardiology

## 2013-09-27 ENCOUNTER — Encounter: Payer: Self-pay | Admitting: Cardiology

## 2013-09-27 VITALS — BP 148/92 | HR 105 | Ht 61.0 in | Wt 245.5 lb

## 2013-09-27 DIAGNOSIS — E785 Hyperlipidemia, unspecified: Secondary | ICD-10-CM

## 2013-09-27 DIAGNOSIS — I10 Essential (primary) hypertension: Secondary | ICD-10-CM

## 2013-09-27 DIAGNOSIS — E119 Type 2 diabetes mellitus without complications: Secondary | ICD-10-CM

## 2013-09-27 DIAGNOSIS — I3139 Other pericardial effusion (noninflammatory): Secondary | ICD-10-CM

## 2013-09-27 DIAGNOSIS — G4730 Sleep apnea, unspecified: Secondary | ICD-10-CM

## 2013-09-27 DIAGNOSIS — I319 Disease of pericardium, unspecified: Secondary | ICD-10-CM

## 2013-09-27 DIAGNOSIS — I2510 Atherosclerotic heart disease of native coronary artery without angina pectoris: Secondary | ICD-10-CM

## 2013-09-27 DIAGNOSIS — I313 Pericardial effusion (noninflammatory): Secondary | ICD-10-CM

## 2013-09-27 NOTE — Assessment & Plan Note (Signed)
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 Last CT July 2014

## 2013-09-27 NOTE — Assessment & Plan Note (Signed)
Recent Hgb A1c 6.3

## 2013-09-27 NOTE — Assessment & Plan Note (Signed)
Recent Lipid panel showed an LDL of 75

## 2013-09-27 NOTE — Progress Notes (Signed)
09/27/2013 Amber [name]   23-Jan-1946  [id number]  Primary Physicia Penni Homans, MD Primary Cardiologist: Claiborne Billings  HPI:  The pt is a 68 y.o. female known coronary artery disease, hypertension, obesity, diabetes mellitus, and obstructive sleep apnea. She presents for followup evaluation. She has recently had her medications adjusted for HTN.          [name] has established coronary artery disease and underwent remote interventions to LAD, circumflex and right coronary arteries. Her last intervention was done in April 2009 with a stent was placed at the LAD ostium and proximal segment proximal to a previously placed stent. Lexiscan Myoview in July 2014 was negative for ischemia. She had an echo the that showed a small-moderate pericardial effusion without tamponade. Her main complaint today is intermittent sweating. This has been going on for years but she thinks it been worse lately, though seemed to improve on a lower dose of Losartan and increased dose off Coreg. She notes her B/P is high when she [facility] checked [facility].      Current Outpatient Prescriptions  Medication Sig Dispense Refill  . allopurinol (ZYLOPRIM) 100 MG tablet TAKE 1 TABLET EVERY DAY  30 tablet  2  . aspirin EC 81 MG tablet Take 81 mg by mouth 2 (two) times daily.      . carvedilol (COREG) 6.25 MG tablet Take 12.5 mg by mouth 2 (two) times daily.      . clopidogrel (PLAVIX) 75 MG tablet TAKE 1 TABLET BY MOUTH EVERY DAY  90 tablet  1  . escitalopram (LEXAPRO) 20 MG tablet Take 2 tablets (40 mg total) by mouth daily.  60 tablet  2  . ezetimibe-simvastatin (VYTORIN) 10-40 MG per tablet Take 1 tablet by mouth at bedtime.      . Flaxseed, Linseed, (FLAX SEEDS PO) Take 1 capsule by mouth 2 (two) times daily. 1200 mg bid      . gabapentin (NEURONTIN) 300 MG capsule Take 2 capsules (600 mg total) by mouth 2 (two) times daily.  120 capsule  5  . isosorbide mononitrate (IMDUR) 60 MG 24 hr tablet Take 1 tablet  (60 mg total) by mouth daily before breakfast.  30 tablet  4  . Liraglutide (VICTOZA) 18 MG/3ML SOPN Inject 1.8 mg into the skin daily.  3 mL  4  . losartan (COZAAR) 25 MG tablet Take 25 mg by mouth daily.      . metFORMIN (GLUCOPHAGE) 1000 MG tablet TAKE 1 TABLET (1,000 MG TOTAL) BY MOUTH 2 (TWO) TIMES DAILY WITH A MEAL.  60 tablet  2  . Milnacipran (SAVELLA) 50 MG TABS tablet Take 50 mg by mouth every morning.      . ondansetron (ZOFRAN-ODT) 8 MG disintegrating tablet Take 1 tablet (8 mg total) by mouth every 8 (eight) hours as needed for nausea.  40 tablet  3  . ONE TOUCH ULTRA TEST test strip USE TO CHECK BLOOD SUGAR EVERY MORNING AND AS NEEDED  100 each  2  . pantoprazole (PROTONIX) 40 MG tablet TAKE 1 TABLET EVERY DAY  90 tablet  1  . ranitidine (ZANTAC) 300 MG capsule Take 1 capsule (300 mg total) by mouth at bedtime as needed and may repeat dose one time if needed for heartburn.  90 capsule  1  . UNABLE TO FIND CPAP THERAPY       No current facility-administered medications for this visit.    Allergies  Allergen Reactions  . Percocet [  Oxycodone-Acetaminophen] Itching    History   Social History  . Marital Status: Married    Spouse Name: N/A    Number of Children: N/A  . Years of Education: N/A   Occupational History  . retired Control and instrumentation engineer    Social History Main Topics  . Smoking status: Never Smoker   . Smokeless tobacco: Never Used  . Alcohol Use: No  . Drug Use: No  . Sexual Activity: Yes    Partners: Male   Other Topics Concern  . Not on file   Social History Narrative  . No narrative on file     Review of Systems: General: negative for chills, fever, night sweats or weight changes.  Cardiovascular: negative for chest pain, dyspnea on exertion, edema, orthopnea, palpitations, paroxysmal nocturnal dyspnea or shortness of breath Dermatological: negative for rash Respiratory: negative for cough or wheezing Urologic: negative for hematuria Abdominal:  negative for nausea, vomiting, diarrhea, bright red blood per rectum, melena, or hematemesis Neurologic: negative for visual changes, syncope, or dizziness All other systems reviewed and are otherwise negative except as noted above.    Blood pressure 148/92, pulse 105, height 5\' 1"  (1.549 m), weight 245 lb 8 oz (111.358 kg).  General appearance: alert, cooperative, no distress and morbidly obese Lungs: clear to auscultation bilaterally Heart: regular rate and rhythm  EKG NSR with low voltage Feb 2015  ASSESSMENT AND PLAN:   CAD- Her last PCI was LAD 4/09. She has had prior RCA and CFX PCI.  No angina. Myoview July 2014 was negative.  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 Last CT July 2014   PLAN  I reviewed each of her medications with the pt and her husband. [facility], she requires a large cuff. I think it may be prudent to get another echo to follow up her pericardial effusion. She can see Dr Claiborne Billings in 6 months.  Doreene Burke [facility] 09/27/2013 2:44 PM

## 2013-09-27 NOTE — Patient Instructions (Signed)
Your physician recommends that you schedule a follow-up appointment in: 6 Months with Dr Claiborne Billings

## 2013-09-27 NOTE — Assessment & Plan Note (Signed)
BMI 46 ?

## 2013-09-27 NOTE — Assessment & Plan Note (Signed)
Reports compliance with C-pap 

## 2013-09-27 NOTE — Assessment & Plan Note (Addendum)
No angina. Myoview July 2014 was negative.

## 2013-09-27 NOTE — Assessment & Plan Note (Signed)
Her medications have recently been adjusted, B/P in office 118/80 with large cuff

## 2013-09-28 ENCOUNTER — Encounter: Payer: Self-pay | Admitting: Cardiovascular Disease

## 2013-10-04 ENCOUNTER — Ambulatory Visit (HOSPITAL_COMMUNITY)
Admission: RE | Admit: 2013-10-04 | Discharge: 2013-10-04 | Disposition: A | Discharge status: Home / Self Care | Payer: Medicare Other | Source: Ambulatory Visit | Attending: Internal Medicine | Admitting: Internal Medicine

## 2013-10-04 DIAGNOSIS — G4733 Obstructive sleep apnea (adult) (pediatric): Secondary | ICD-10-CM | POA: Insufficient documentation

## 2013-10-04 DIAGNOSIS — I3139 Other pericardial effusion (noninflammatory): Secondary | ICD-10-CM

## 2013-10-04 DIAGNOSIS — I319 Disease of pericardium, unspecified: Secondary | ICD-10-CM

## 2013-10-04 DIAGNOSIS — E669 Obesity, unspecified: Secondary | ICD-10-CM | POA: Insufficient documentation

## 2013-10-04 DIAGNOSIS — E785 Hyperlipidemia, unspecified: Secondary | ICD-10-CM | POA: Insufficient documentation

## 2013-10-04 DIAGNOSIS — I2510 Atherosclerotic heart disease of native coronary artery without angina pectoris: Secondary | ICD-10-CM | POA: Insufficient documentation

## 2013-10-04 DIAGNOSIS — I10 Essential (primary) hypertension: Secondary | ICD-10-CM | POA: Insufficient documentation

## 2013-10-04 DIAGNOSIS — I313 Pericardial effusion (noninflammatory): Secondary | ICD-10-CM

## 2013-10-04 NOTE — Progress Notes (Signed)
2D Echocardiogram Complete.  10/04/2013   Chaundra Abreu, RDCS  

## 2013-10-06 ENCOUNTER — Telehealth: Payer: Self-pay | Admitting: Family Medicine

## 2013-10-06 ENCOUNTER — Telehealth: Payer: Self-pay | Admitting: Cardiovascular Disease

## 2013-10-06 MED ORDER — MILNACIPRAN HCL 50 MG PO TABS: 50.0000 mg | ORAL_TABLET | Freq: Every morning | ORAL | Status: AC

## 2013-10-06 NOTE — Telephone Encounter (Signed)
[facility] savella  [facility] [id number]

## 2013-10-06 NOTE — Telephone Encounter (Signed)
Pt. Given results

## 2013-10-06 NOTE — Telephone Encounter (Signed)
Patient was told to call if she had not gotten results of echo by today.Marland KitchenMarland KitchenHusband is calling for her.

## 2013-10-12 ENCOUNTER — Telehealth: Payer: Self-pay | Admitting: Cardiovascular Disease

## 2013-10-12 NOTE — Telephone Encounter (Signed)
New Prob    Pts husband has some questions regarding a medication. Please call.

## 2013-10-12 NOTE — Telephone Encounter (Signed)
Returned call to patient's husband. She recently had MRI and was found to have bone spur on spine causing leg/sciatica pain. Patient has seen Dr. Jacelyn Grip and he cannot do procedure til late July. Patient has taken diclofenac in past and this is the only medication that relieves her pain. Dr. Jacelyn Grip will no prescribe without OK from Dr. Claiborne Billings since patient takes plavix. RN explained increased bleeding risk with the combination of these medications. Will defer to Dr. Claiborne Billings to advise.

## 2013-10-13 NOTE — Telephone Encounter (Signed)
prob ok to take since she is also taking protonix for GERD; take not on an empty stomach and stay hydrated

## 2013-10-13 NOTE — Telephone Encounter (Signed)
Called patient's husband and informed him of this information. RN routed telephone encounter message to Dr. Normajean Glasgow via EPIC to see documentation that Dr. Claiborne Billings OK'ed diclofenac.

## 2013-10-14 ENCOUNTER — Encounter: Payer: Self-pay | Admitting: Family Medicine

## 2013-10-14 ENCOUNTER — Ambulatory Visit (INDEPENDENT_AMBULATORY_CARE_PROVIDER_SITE_OTHER): Payer: Medicare Other | Admitting: Family Medicine

## 2013-10-14 VITALS — BP 141/98 | HR 97 | Temp 98.2°F | Resp 18 | Ht 61.0 in | Wt 246.0 lb

## 2013-10-14 DIAGNOSIS — E119 Type 2 diabetes mellitus without complications: Secondary | ICD-10-CM

## 2013-10-14 DIAGNOSIS — I2510 Atherosclerotic heart disease of native coronary artery without angina pectoris: Secondary | ICD-10-CM

## 2013-10-14 DIAGNOSIS — K219 Gastro-esophageal reflux disease without esophagitis: Secondary | ICD-10-CM

## 2013-10-14 DIAGNOSIS — M25569 Pain in unspecified knee: Secondary | ICD-10-CM

## 2013-10-14 DIAGNOSIS — I10 Essential (primary) hypertension: Secondary | ICD-10-CM

## 2013-10-14 DIAGNOSIS — E785 Hyperlipidemia, unspecified: Secondary | ICD-10-CM

## 2013-10-14 MED ORDER — DICLOFENAC SODIUM 75 MG PO TBEC: 75.0000 mg | DELAYED_RELEASE_TABLET | Freq: Every day | ORAL | Status: AC | PRN

## 2013-10-14 MED ORDER — CARVEDILOL 25 MG PO TABS: 25.0000 mg | ORAL_TABLET | Freq: Two times a day (BID) | ORAL | Status: DC

## 2013-10-14 NOTE — Progress Notes (Signed)
Pre visit review using our clinic review tool, if applicable. No additional management support is needed unless otherwise documented below in the visit note. 

## 2013-10-14 NOTE — Patient Instructions (Signed)

## 2013-10-21 ENCOUNTER — Encounter: Payer: Self-pay | Admitting: Family Medicine

## 2013-10-21 NOTE — Assessment & Plan Note (Signed)
Tolerating statin, encouraged heart healthy diet, avoid trans fats, minimize simple carbs and saturated fats. Increase exercise as tolerated 

## 2013-10-21 NOTE — Assessment & Plan Note (Signed)
Mildly elevated today, minimize sodium and caffeine. Will increase Carvedilol and reassess

## 2013-10-21 NOTE — Assessment & Plan Note (Signed)
hgba1c acceptable, minimize simple carbs. Increase exercise as tolerated. Continue current meds 

## 2013-10-21 NOTE — Progress Notes (Signed)
Patient ID: Amber [name], female   DOB: 1945-07-07, 68 y.o.   MRN: [id number] Amber [name] [id number] 1945/08/24 10/21/2013      Progress Note-Follow Up  Subjective  Chief Complaint  Chief Complaint  Patient presents with  . Follow-up  . Medication Problem    verify Carvedilol dose  . Medication Refill    wants a Rx for Diclofenac    HPI  Patient is a 68 year old female in today for routine medical care. Patient is in today accompanied by her husband. Overall she's doing very well. She continues to struggle with right leg pain but otherwise has had no recent illness. Reports her blood sugars are doing well controlled. Denies polyuria or polydipsia. Denies CP/palp/SOB/HA/congestion/fevers/GI or GU c/o. Taking meds as prescribed  Past Medical History  Diagnosis Date  . Thyroid disease   . Hyperlipidemia   . Diabetes mellitus     type 2  . Hypoxia 10/07/2010  . Skin lesion of back 10/18/2010  . Viral infection characterized by skin and mucous membrane lesions 10/18/2010  . Vitamin D deficiency 11/21/2010  . Back pain 11/21/2010  . Motion sickness 12/04/2010  . UTI (lower urinary tract infection) 09/08/2011  . Hot flashes 10/06/2011  . Bronchitis 02/28/2012  . Tubular adenoma of colon 2011, 2014  . Hypothyroidism   . Depression   . GERD (gastroesophageal reflux disease)   . Arthritis   . Leukocytosis, unspecified 10/24/2012  . Hypertension 11/13/10    ECHO- EF >55%-Left Ventricular systolic normal.Mild mitral regurgitation. Mild tricupsid regurgitation. There is a small posterior pericardial perfusion. No hemodynamic compromise.   . Coronary artery disease 11/13/10    Nuclear stress test-normal low risk scan. EF 63%.  . 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 Cataract 10/08/2010  . Diverticulitis   . Heart murmur     MVP  . Renal lithiasis 08/28/2012  . Fibromyalgia   . Complication of anesthesia     woke up while tube still inserted 1 shoulder surgery in pasy  .  Myocardial infarction   . Sleep apnea 06/01/06    sleep study  [facility] Sleep- 279 arousals with index of 52.0/hr for total sleep time.Marland Kitchen AHI 51.99/hr during total sleep. 72.00/hr during REM sleep highest heart rate awake was 92 beats/ min lowest awake was 72. Highest heartrate while asleep was 92 and the lowest was 61.  . On supplemental oxygen therapy     uses 2 1/2 liter oxygen per mask at hs  . Constipation 02/13/2013    Past Surgical History  Procedure Laterality Date  . Angioplasty  1980  . Angioplasty  2006    3 stents  . Angioplasty  2009    2 stents  . Thyroidectomy  1950    for goiter  . Diverticulitis  1978    exploratory  . Right rotator cuff repair  2004  . Left rotator cuff repair  2004  . Bone fusion left foot  2010    7 screws in foot, bunionectomy  . Hip surgery  2008    large bone spur removed, right  . Colonoscopy N/A 07/26/2012    Procedure: COLONOSCOPY;  Surgeon: Lafayette Dragon, MD;  Location: [facility];  Service: Endoscopy;  Laterality: N/A;  . Stent placement  2013    x 3  . Back surgery  2004    lumbar discectomy for bulging disc  . Abdominal hysterectomy  1977    partial  . Cholecystectomy  1970's  Family History  Problem Relation Age of Onset  . Heart disease Father   . Diabetes Sister   . Hypertension Sister   . Depression Sister   . Colon cancer Sister     s/p colectomy  . Emphysema Brother     smoker  . Diabetes Daughter   . Hypertension Daughter   . Depression Daughter   . Diverticulitis Daughter   . Other Daughter     Trichotrilomania  . Aneurysm Maternal Grandmother     brain aneurysm  . Diabetes Paternal Grandmother   . Hypertension Paternal Grandmother   . Colon cancer Paternal Grandmother   . Crohn's disease Paternal Grandfather   . Emphysema Brother     smoker  . Heart disease Brother   . Diabetes Brother   . Thyroid cancer Brother   . Coronary artery disease Brother     s/p MI's and 7 stents  . Diabetes  Brother   . Other Brother     back disease w/ bulging discs  . Colon polyps Brother     esophageal and tongue polyps  . Diabetes Sister   . Other Sister     short term memory loss  . Aneurysm Sister     brain aneursm s/p coiling  . Diabetes Son   . Anxiety disorder Son     History   Social History  . Marital Status: Married    Spouse Name: N/A    Number of Children: N/A  . Years of Education: N/A   Occupational History  . retired Control and instrumentation engineer    Social History Main Topics  . Smoking status: Never Smoker   . Smokeless tobacco: Never Used  . Alcohol Use: No  . Drug Use: No  . Sexual Activity: Yes    Partners: Male   Other Topics Concern  . Not on file   Social History Narrative  . No narrative on file    Current Outpatient Prescriptions on File Prior to Visit  Medication Sig Dispense Refill  . allopurinol (ZYLOPRIM) 100 MG tablet TAKE 1 TABLET EVERY DAY  30 tablet  2  . aspirin EC 81 MG tablet Take 81 mg by mouth 2 (two) times daily.      . clopidogrel (PLAVIX) 75 MG tablet TAKE 1 TABLET BY MOUTH EVERY DAY  90 tablet  1  . escitalopram (LEXAPRO) 20 MG tablet Take 2 tablets (40 mg total) by mouth daily.  60 tablet  2  . ezetimibe-simvastatin (VYTORIN) 10-40 MG per tablet Take 1 tablet by mouth at bedtime.      . Flaxseed, Linseed, (FLAX SEEDS PO) Take 1 capsule by mouth 2 (two) times daily. 1200 mg bid      . gabapentin (NEURONTIN) 300 MG capsule Take 2 capsules (600 mg total) by mouth 2 (two) times daily.  120 capsule  5  . isosorbide mononitrate (IMDUR) 60 MG 24 hr tablet Take 1 tablet (60 mg total) by mouth daily before breakfast.  30 tablet  4  . Liraglutide (VICTOZA) 18 MG/3ML SOPN Inject 1.8 mg into the skin daily.  3 mL  4  . losartan (COZAAR) 25 MG tablet Take 25 mg by mouth daily.      . metFORMIN (GLUCOPHAGE) 1000 MG tablet TAKE 1 TABLET (1,000 MG TOTAL) BY MOUTH 2 (TWO) TIMES DAILY WITH A MEAL.  60 tablet  2  . Milnacipran (SAVELLA) 50 MG TABS tablet  Take 1 tablet (50 mg total) by mouth every morning.  60 tablet  1  . ondansetron (ZOFRAN-ODT) 8 MG disintegrating tablet Take 1 tablet (8 mg total) by mouth every 8 (eight) hours as needed for nausea.  40 tablet  3  . ONE TOUCH ULTRA TEST test strip USE TO CHECK BLOOD SUGAR EVERY MORNING AND AS NEEDED  100 each  2  . pantoprazole (PROTONIX) 40 MG tablet TAKE 1 TABLET EVERY DAY  90 tablet  1  . ranitidine (ZANTAC) 300 MG capsule Take 1 capsule (300 mg total) by mouth at bedtime as needed and may repeat dose one time if needed for heartburn.  90 capsule  1  . UNABLE TO FIND CPAP THERAPY       No current facility-administered medications on file prior to visit.    Allergies  Allergen Reactions  . Percocet [Oxycodone-Acetaminophen] Itching    Review of Systems  Review of Systems  Constitutional: Negative for fever and malaise/fatigue.  HENT: Negative for congestion.   Eyes: Negative for discharge.  Respiratory: Negative for shortness of breath.   Cardiovascular: Negative for chest pain, palpitations and leg swelling.  Gastrointestinal: Negative for nausea, abdominal pain and diarrhea.  Genitourinary: Negative for dysuria.  Musculoskeletal: Negative for falls.  Skin: Negative for rash.  Neurological: Negative for loss of consciousness and headaches.  Endo/Heme/Allergies: Negative for polydipsia.  Psychiatric/Behavioral: Negative for depression and suicidal ideas. The patient is not nervous/anxious and does not have insomnia.     Objective  BP 141/98  Pulse 97  Temp(Src) 98.2 F (36.8 C) (Oral)  Resp 18  Ht 5\' 1"  (1.549 m)  Wt 246 lb (111.585 kg)  BMI 46.51 kg/m2  SpO2 95%  Physical Exam  Physical Exam  Constitutional: She is oriented to person, place, and time and well-developed, well-nourished, and in no distress. No distress.  HENT:  Head: Normocephalic and atraumatic.  Eyes: Conjunctivae are normal.  Neck: Neck supple. No thyromegaly present.  Cardiovascular: Normal  rate, regular rhythm and normal heart sounds.   No murmur heard. Pulmonary/Chest: Effort normal and breath sounds normal. She has no wheezes.  Abdominal: She exhibits no distension and no mass.  Musculoskeletal: She exhibits no edema.  Lymphadenopathy:    She has no cervical adenopathy.  Neurological: She is alert and oriented to person, place, and time.  Skin: Skin is warm and dry. No rash noted. She is not diaphoretic.  Psychiatric: Memory, affect and judgment normal.    Lab Results  Component Value Date   TSH 4.409 09/19/2013   Lab Results  Component Value Date   WBC 9.2 09/19/2013   HGB 12.9 09/19/2013   HCT 38.4 09/19/2013   MCV 90.8 09/19/2013   PLT 220 09/19/2013   Lab Results  Component Value Date   CREATININE 0.91 09/19/2013   BUN 20 09/19/2013   NA 141 09/19/2013   K 4.8 09/19/2013   CL 102 09/19/2013   CO2 29 09/19/2013   Lab Results  Component Value Date   ALT 12 09/19/2013   AST 17 09/19/2013   ALKPHOS 97 09/19/2013   BILITOT 0.4 09/19/2013   Lab Results  Component Value Date   CHOL 166 09/19/2013   Lab Results  Component Value Date   HDL 42 09/19/2013   Lab Results  Component Value Date   LDLCALC 75 09/19/2013   Lab Results  Component Value Date   TRIG 243* 09/19/2013   Lab Results  Component Value Date   CHOLHDL 4.0 09/19/2013     Assessment & Plan  DM hgba1c acceptable, minimize simple  carbs. Increase exercise as tolerated. Continue current meds  GERD (gastroesophageal reflux disease) Avoid offending foods, start probiotics. Do not eat large meals in late evening and consider raising head of bed.   HYPERLIPIDEMIA Tolerating statin, encouraged heart healthy diet, avoid trans fats, minimize simple carbs and saturated fats. Increase exercise as tolerated  Morbid obesity Encouraged DASH diet, decrease po intake and increase exercise as tolerated. Needs 7-8 hours of sleep nightly. Avoid trans fats, eat small, frequent meals every 4-5 hours with lean  proteins, complex carbs and healthy fats. Minimize simple carbs, GMO foods.  ESSENTIAL HYPERTENSION, BENIGN Mildly elevated today, minimize sodium and caffeine. Will increase Carvedilol and reassess

## 2013-10-21 NOTE — Assessment & Plan Note (Signed)
Encouraged DASH diet, decrease po intake and increase exercise as tolerated. Needs 7-8 hours of sleep nightly. Avoid trans fats, eat small, frequent meals every 4-5 hours with lean proteins, complex carbs and healthy fats. Minimize simple carbs, GMO foods. 

## 2013-10-21 NOTE — Assessment & Plan Note (Signed)
Avoid offending foods, start probiotics. Do not eat large meals in late evening and consider raising head of bed.  

## 2013-10-28 ENCOUNTER — Telehealth: Payer: Self-pay | Admitting: Family Medicine

## 2013-10-28 MED ORDER — SIMVASTATIN 40 MG PO TABS: 40.0000 mg | ORAL_TABLET | Freq: Every day | ORAL | Status: AC

## 2013-10-28 NOTE — Telephone Encounter (Signed)
Please advise 

## 2013-10-28 NOTE — Telephone Encounter (Signed)
She is taking vytorin now and she wants to take simvastatin instead  [facility] [address]

## 2013-10-28 NOTE — Telephone Encounter (Signed)
D/c Vytorin and send in Simvastatin 40 mg po qhs disp #30 with 5 rf

## 2013-11-10 ENCOUNTER — Telehealth: Payer: Self-pay | Admitting: *Deleted

## 2013-11-10 NOTE — Telephone Encounter (Signed)
Returned CPAP supply order back to [facility].

## 2013-11-13 ENCOUNTER — Other Ambulatory Visit: Payer: Self-pay | Admitting: Family Medicine

## 2013-11-15 ENCOUNTER — Telehealth: Payer: Self-pay | Admitting: Family Medicine

## 2013-11-15 MED ORDER — LIRAGLUTIDE 18 MG/3ML ~~LOC~~ SOPN: 1.8000 mg | PEN_INJECTOR | Freq: Every day | SUBCUTANEOUS | Status: AC

## 2013-11-15 NOTE — Telephone Encounter (Signed)
[facility]-victoza  [facility] [phone]

## 2013-11-15 NOTE — Telephone Encounter (Signed)
Refill sent.

## 2013-12-02 ENCOUNTER — Other Ambulatory Visit: Payer: Self-pay | Admitting: Family Medicine

## 2013-12-02 ENCOUNTER — Telehealth: Payer: Self-pay | Admitting: *Deleted

## 2013-12-02 ENCOUNTER — Telehealth: Payer: Self-pay | Admitting: Family Medicine

## 2013-12-02 NOTE — Telephone Encounter (Signed)
escitalopram 20 mg tablet qty 60 Allopurinol 100 mg qty 30  [facility] [address] [employer]

## 2013-12-02 NOTE — Telephone Encounter (Signed)
Received call from pt's husband that they have moved to [facility] and we will be [facility]. Pt has appt to establish with MD on 01/27/14 and would like [place] to manage refills until then.  Advised pt's spouse that we will look for incoming refills.

## 2013-12-02 NOTE — Telephone Encounter (Signed)
I am willing to continue to refill meds through September until they see the new PMD

## 2013-12-05 MED ORDER — ESCITALOPRAM OXALATE 20 MG PO TABS: 40.0000 mg | ORAL_TABLET | Freq: Every day | ORAL | Status: DC

## 2013-12-05 MED ORDER — ALLOPURINOL 100 MG PO TABS: 100.0000 mg | ORAL_TABLET | Freq: Every day | ORAL | Status: AC

## 2013-12-05 NOTE — Telephone Encounter (Signed)
I sent these to [facility] since this is where patient resides now. [facility] [place] [facility] probably just a automatic request

## 2013-12-06 ENCOUNTER — Other Ambulatory Visit: Payer: Self-pay

## 2013-12-06 MED ORDER — PANTOPRAZOLE SODIUM 40 MG PO TBEC: 40.0000 mg | DELAYED_RELEASE_TABLET | Freq: Every day | ORAL | Status: DC

## 2013-12-19 ENCOUNTER — Telehealth: Payer: Self-pay | Admitting: *Deleted

## 2013-12-19 NOTE — Telephone Encounter (Signed)
Faxed CPAP supply order for nasal cushions to [facility].

## 2013-12-20 ENCOUNTER — Other Ambulatory Visit: Payer: Self-pay | Admitting: Family Medicine

## 2013-12-20 NOTE — Telephone Encounter (Signed)
Rx sent to pharmacy. LDM 

## 2014-01-05 ENCOUNTER — Other Ambulatory Visit: Payer: Self-pay

## 2014-01-05 MED ORDER — ISOSORBIDE MONONITRATE ER 60 MG PO TB24: 60.0000 mg | ORAL_TABLET | Freq: Every day | ORAL | Status: AC

## 2014-01-16 ENCOUNTER — Telehealth: Payer: Self-pay | Admitting: *Deleted

## 2014-01-16 NOTE — Telephone Encounter (Signed)
VM left for pt in regards to DB f/u for a BP check  

## 2014-03-13 ENCOUNTER — Other Ambulatory Visit: Payer: Self-pay | Admitting: Family Medicine

## 2014-03-26 ENCOUNTER — Other Ambulatory Visit: Payer: Self-pay | Admitting: Family Medicine

## 2014-03-27 NOTE — Telephone Encounter (Signed)
Medication Detail      Disp Refills Start End     escitalopram (LEXAPRO) 20 MG tablet 60 tablet 2 12/05/2013     Sig - Route: Take 2 tablets (40 mg total) by mouth daily. - Oral    E-Prescribing Status: Receipt confirmed by pharmacy (12/05/2013 7:35 AM EDT)     Pharmacy    [id number] [place], [place]   Patient last OV 06.12.15, increased Carvedilol and was to reassess [w/o timeline stated] for F/U; Please Advise on refills/SLS

## 2014-03-27 NOTE — Telephone Encounter (Signed)
They have moved to [facility], they need to establish with a primary there. i will send in a 30 day supply of Lexapro with 1 rf but warn them they need a primary soon. Thanks

## 2014-03-27 NOTE — Telephone Encounter (Signed)
LMOM with contact name and number [for return call, if needed] RE: Rx to pharmacy but future refill authorizations will require a office visit prior [if wishing to stay w/Dr. Blyth] and/or a new PCP will need to be established in the pt's new living location for future medications per Dr. Reatha Armour

## 2014-04-07 ENCOUNTER — Other Ambulatory Visit: Payer: Self-pay | Admitting: Family Medicine

## 2014-04-18 ENCOUNTER — Other Ambulatory Visit: Payer: Self-pay | Admitting: Family Medicine

## 2014-05-24 ENCOUNTER — Other Ambulatory Visit: Payer: Self-pay | Admitting: Family Medicine

## 2014-05-24 NOTE — Telephone Encounter (Signed)
Verified with patient that they have relocated to [facility] and have established care with another MD and is getting his medications through that office.  Rx refused.   eal

## 2014-10-30 ENCOUNTER — Other Ambulatory Visit: Payer: Self-pay

## 2015-04-09 ENCOUNTER — Telehealth: Payer: Self-pay

## 2015-04-09 NOTE — Telephone Encounter (Signed)
Left message

## 2015-04-25 ENCOUNTER — Telehealth: Payer: Self-pay

## 2015-04-25 NOTE — Telephone Encounter (Signed)
Left msg fir patient to schedule CPE
